# Patient Record
Sex: Male | Born: 1965 | Race: White | Hispanic: No | Marital: Married | Smoking: Current every day smoker
Health system: Southern US, Community
[De-identification: ages and names within clinical notes are randomized; demographics above are authoritative.]

## PROBLEM LIST (undated history)

## (undated) DIAGNOSIS — E119 Type 2 diabetes mellitus without complications: Secondary | ICD-10-CM

## (undated) DIAGNOSIS — D649 Anemia, unspecified: Secondary | ICD-10-CM

## (undated) DIAGNOSIS — I10 Essential (primary) hypertension: Secondary | ICD-10-CM

## (undated) DIAGNOSIS — T7840XA Allergy, unspecified, initial encounter: Secondary | ICD-10-CM

## (undated) DIAGNOSIS — E785 Hyperlipidemia, unspecified: Secondary | ICD-10-CM

## (undated) DIAGNOSIS — K219 Gastro-esophageal reflux disease without esophagitis: Secondary | ICD-10-CM

## (undated) HISTORY — PX: UPPER GASTROINTESTINAL ENDOSCOPY: SHX188

## (undated) HISTORY — DX: Hyperlipidemia, unspecified: E78.5

## (undated) HISTORY — PX: ANKLE SURGERY: SHX546

## (undated) HISTORY — DX: Anemia, unspecified: D64.9

## (undated) HISTORY — DX: Gastro-esophageal reflux disease without esophagitis: K21.9

## (undated) HISTORY — DX: Allergy, unspecified, initial encounter: T78.40XA

---

## 1992-01-17 HISTORY — PX: VASECTOMY: SHX75

## 2002-01-29 ENCOUNTER — Ambulatory Visit (HOSPITAL_COMMUNITY): Admission: RE | Admit: 2002-01-29 | Discharge: 2002-01-29 | Payer: Self-pay | Admitting: Internal Medicine

## 2002-09-05 ENCOUNTER — Emergency Department (HOSPITAL_COMMUNITY): Admission: EM | Admit: 2002-09-05 | Discharge: 2002-09-05 | Payer: Self-pay | Admitting: *Deleted

## 2002-09-05 ENCOUNTER — Encounter: Payer: Self-pay | Admitting: *Deleted

## 2003-04-09 ENCOUNTER — Ambulatory Visit (HOSPITAL_COMMUNITY): Admission: RE | Admit: 2003-04-09 | Discharge: 2003-04-09 | Payer: Self-pay | Admitting: Internal Medicine

## 2006-06-09 ENCOUNTER — Emergency Department (HOSPITAL_COMMUNITY): Admission: EM | Admit: 2006-06-09 | Discharge: 2006-06-09 | Payer: Self-pay | Admitting: Emergency Medicine

## 2006-06-27 ENCOUNTER — Encounter: Admission: RE | Admit: 2006-06-27 | Discharge: 2006-06-27 | Payer: Self-pay | Admitting: Orthopedic Surgery

## 2006-07-25 ENCOUNTER — Ambulatory Visit (HOSPITAL_BASED_OUTPATIENT_CLINIC_OR_DEPARTMENT_OTHER): Admission: RE | Admit: 2006-07-25 | Discharge: 2006-07-25 | Payer: Self-pay | Admitting: Orthopedic Surgery

## 2010-05-31 NOTE — Op Note (Signed)
NAMEOLANDER, FRIEDL NO.:  [id number]   MEDICAL RECORD NO.:  [id number]          PATIENT TYPE:  AMB   LOCATION:  DSC                          FACILITY:  [facility]   PHYSICIAN:  Loreta Ave, M.D. DATE OF BIRTH:  [birth year]   DATE OF PROCEDURE:  07/25/2006  DATE OF DISCHARGE:                               OPERATIVE REPORT   PREOPERATIVE DIAGNOSIS:  Traumatic anterior impingement, loose bodies  right ankle.   POSTOPERATIVE DIAGNOSIS:  Traumatic anterior impingement, loose bodies  right ankle, extensive synovitis and more than five osteochondral loose  bodies found.   PROCEDURE:  Right ankle exam under anesthesia, arthroscopy, removal of  numerous loose bodies, five large and numerous small ones, extensive  synovectomy, and then anterior decompression with removal of tibiotalar  as well as medial malleolar spurs.   SURGEON:  Loreta Ave, M.D.   ASSISTANT:  Genene Churn. Barry Dienes, P.A.-C.   ANESTHESIA:  General.   BLOOD LOSS:  Minimal.   TOURNIQUET TIME:  1 hour.   SPECIMENS:  None.   CULTURES:  None.   COMPLICATIONS:  None.   DRESSING:  Soft compressive with Cam walker.   PROCEDURE IN DETAIL:  The patient was brought to the operating room and  placed on the operating table in supine position.  After adequate  anesthesia had been obtained, stirrup applied.  Prepped and draped in  the usual sterile fashion.  Fluoroscopic views to confirm anterior bony  impingement of the loose bodies.  Exsanguinated with elevation of  Esmarch, tourniquet inflated to 300 mmHg.  Anterolateral and  anteromedial portals avoiding neurovascular structures and tendons.  Arthroscope was introduced, ankle distended and inspected.  Extensive  loose bodies throughout, all removed, large ones with grasping forceps,  small ones with the shaver.  I confirmed anterior bony impingement.  Sequential removal of all of the impinging spurs with shaver and then  bur.  Obliterating all  impingement.  He went from essentially no  dorsiflexion to almost 20 degrees of dorsiflexion at completion.  Both  gutters assessed to remove loose bodies there.  When that was all  completed, we examined the rest of the ankle.  Some mild grade 2 change  on the medial malleolus, but otherwise, the rest of the ankle looked  fairly good.  After  confirmation of good motion, stable ankle, and adequate decompression.  The instruments and fluid removed.  All portals closed with nylon.  Tourniquet deflated and removed.  Sterile compressive dressing applied.  Cam walker applied.  Anesthesia reversed.  Brought to recovery room.  Tolerated the surgery well.  No complications.      Loreta Ave, M.D.  Electronically Signed     DFM/MEDQ  D:  07/26/2006  T:  07/26/2006  Job:  [id number]

## 2010-06-03 NOTE — Op Note (Signed)
NAMEVIRGIE, [name]                           ACCOUNT NO.:  [id number]   MEDICAL RECORD NO.:  [id number]                   PATIENT TYPE:  AMB   LOCATION:  DAY                                  FACILITY:  [facility]   PHYSICIAN:  [name], M.D.                 DATE OF BIRTH:  1965-05-31   DATE OF PROCEDURE:  01/29/2002  DATE OF DISCHARGE:                                 OPERATIVE REPORT   PROCEDURE:  Esophagogastroduodenoscopy.   ENDOSCOPIST:  [name], M.D.   INDICATIONS:  This patient is a 45 year old Caucasian male with recurrent  epigastric and retrosternal pain.  He has been tried on various PPIs and has  had no relief. The pain is relieved with meals but only for a few minutes.  He was treated for an H. pylori infection, about 2 years ago, but he only  was treated with a single antibiotic.  He does continue anywhere from 0-6  BCs for headache.  He has not had any dysphagia or melena.  He is undergoing  diagnostic examination.  Procedure and risks were reviewed with the patient  and informed consent was obtained.   PREOPERATIVE MEDICATIONS:  Cetacaine spray for oropharyngeal topical  anesthesia, Demerol 50 mg IV and Versed 7 mg IV in divided dose.   INSTRUMENT:  Olympus video system.   FINDINGS:  Procedure performed in endoscopy suite.  The patient's vital  signs and O2 saturation were monitored during the procedure and remained  stable.  The patient was placed in the left lateral recumbent position and  endoscope was passed via the oropharynx without any difficulty into the  esophagus.   ESOPHAGUS:  Mucosa of the esophagus was normal throughout.  Squamocolumnar  junction was unremarkable.  No hernia was noted.   STOMACH:  It was empty and distended very well with insufflation.  The folds  of the proximal stomach were normal.  Examination of the mucosa revealed  anterolateral erythema.  There were 3 prepyloric ulcers.  Two were 3-4 mm.  He largest one was fairly  deep along the greater curvature measuring about  10 x 5 mm.  There was a clean base.  Endoscopically these ulcers appeared to  be benign.  Angularis and fundus were examined by retroflexing the scope and  were normal.   DUODENUM:  Examination of the bulb and second part of the duodenum was  normal.   Endoscope was withdrawn.  The patient tolerated the procedure well.   FINAL DIAGNOSES:  1. Multiple (3) pyloric ulcers.  2. Suspect that this could be due to chronic Helicobacter pylori infection     or use of BCs or a combination.   RECOMMENDATIONS:  Will treat him with Prevpac for 2 weeks.  He will resume  his Protonix when he finishes his Prevpac.  The patient advised not to take  [facility] powder or other NSAIDs.  I talked  with Dr. Ouida Golden over the phone and will  treat his headache with Midrin. If this does help he will follow with Dr.  Ouida Golden to review other options.   We plan to bring him back for follow up EGD 10 weeks from now.                                               [name], M.D.    NR/MEDQ  D:  01/29/2002  T:  01/29/2002  Job:  [id number]   cc:   [name]. [name], M.D.  [address]  [place]  [place] [address]  Fax: [phone]

## 2010-11-01 LAB — POCT HEMOGLOBIN-HEMACUE
Hemoglobin: 14.4
Operator id: 128471

## 2010-11-01 LAB — BASIC METABOLIC PANEL
Calcium: 9.2
Chloride: 101
Creatinine, Ser: 0.71
GFR calc Af Amer: >60

## 2011-05-22 ENCOUNTER — Ambulatory Visit
Admission: RE | Admit: 2011-05-22 | Discharge: 2011-05-22 | Disposition: A | Discharge status: Home / Self Care | Payer: 59 | Source: Ambulatory Visit | Attending: Internal Medicine | Admitting: Internal Medicine

## 2011-05-22 ENCOUNTER — Other Ambulatory Visit: Payer: Self-pay | Admitting: Internal Medicine

## 2011-05-22 DIAGNOSIS — R1031 Right lower quadrant pain: Secondary | ICD-10-CM

## 2011-05-22 MED ORDER — IOHEXOL 300 MG/ML  SOLN
30.0000 mL | Freq: Once | INTRAMUSCULAR | Status: AC | PRN
  Administered 2011-05-22: 30 mL via ORAL

## 2011-05-22 MED ORDER — IOHEXOL 300 MG/ML  SOLN
100.0000 mL | Freq: Once | INTRAMUSCULAR | Status: AC | PRN
  Administered 2011-05-22: 100 mL via INTRAVENOUS

## 2012-11-22 ENCOUNTER — Other Ambulatory Visit: Payer: Self-pay | Admitting: Internal Medicine

## 2012-11-22 DIAGNOSIS — N5089 Other specified disorders of the male genital organs: Secondary | ICD-10-CM

## 2012-11-25 ENCOUNTER — Ambulatory Visit
Admission: RE | Admit: 2012-11-25 | Discharge: 2012-11-25 | Disposition: A | Discharge status: Home / Self Care | Payer: BC Managed Care – PPO | Source: Ambulatory Visit | Attending: Internal Medicine | Admitting: Internal Medicine

## 2012-11-25 DIAGNOSIS — N5089 Other specified disorders of the male genital organs: Secondary | ICD-10-CM

## 2013-03-24 ENCOUNTER — Ambulatory Visit (INDEPENDENT_AMBULATORY_CARE_PROVIDER_SITE_OTHER): Payer: BC Managed Care – PPO | Admitting: Surgery

## 2013-04-10 ENCOUNTER — Ambulatory Visit (INDEPENDENT_AMBULATORY_CARE_PROVIDER_SITE_OTHER): Payer: BC Managed Care – PPO | Admitting: Surgery

## 2015-12-07 ENCOUNTER — Encounter (HOSPITAL_COMMUNITY): Payer: Self-pay | Admitting: Emergency Medicine

## 2015-12-07 ENCOUNTER — Emergency Department (HOSPITAL_COMMUNITY)
Admission: EM | Admit: 2015-12-07 | Discharge: 2015-12-07 | Disposition: A | Discharge status: Home / Self Care | Payer: BLUE CROSS/BLUE SHIELD | Attending: Emergency Medicine | Admitting: Emergency Medicine

## 2015-12-07 DIAGNOSIS — E11649 Type 2 diabetes mellitus with hypoglycemia without coma: Secondary | ICD-10-CM | POA: Insufficient documentation

## 2015-12-07 DIAGNOSIS — F17210 Nicotine dependence, cigarettes, uncomplicated: Secondary | ICD-10-CM | POA: Diagnosis not present

## 2015-12-07 DIAGNOSIS — I10 Essential (primary) hypertension: Secondary | ICD-10-CM | POA: Diagnosis not present

## 2015-12-07 DIAGNOSIS — E162 Hypoglycemia, unspecified: Secondary | ICD-10-CM

## 2015-12-07 HISTORY — DX: Essential (primary) hypertension: I10

## 2015-12-07 HISTORY — DX: Type 2 diabetes mellitus without complications: E11.9

## 2015-12-07 LAB — BASIC METABOLIC PANEL
Anion gap: 11 (ref 5–15)
BUN: 15 mg/dL (ref 6–20)
CALCIUM: 9 mg/dL (ref 8.9–10.3)
CHLORIDE: 108 mmol/L (ref 101–111)
CO2: 22 mmol/L (ref 22–32)
CREATININE: 0.85 mg/dL (ref 0.61–1.24)
GFR calc Af Amer: >60 mL/min (ref >60)
GFR calc non Af Amer: >60 mL/min (ref >60)
Glucose, Bld: 44 mg/dL — CL (ref 65–99)
Potassium: 3.6 mmol/L (ref 3.5–5.1)
SODIUM: 141 mmol/L (ref 135–145)

## 2015-12-07 LAB — CBC WITH DIFFERENTIAL/PLATELET
BASOS PCT: 0 %
Basophils Absolute: 0 10*3/uL (ref 0.0–0.1)
EOS ABS: 0 10*3/uL (ref 0.0–0.7)
EOS PCT: 0 %
HCT: 33.4 % — ABNORMAL LOW (ref 39.0–52.0)
HEMOGLOBIN: 10.5 g/dL — AB (ref 13.0–17.0)
LYMPHS ABS: 1 10*3/uL (ref 0.7–4.0)
Lymphocytes Relative: 10 %
MCH: 24.5 pg — AB (ref 26.0–34.0)
MCHC: 31.4 g/dL (ref 30.0–36.0)
MCV: 78 fL (ref 78.0–100.0)
MONO ABS: 0.5 10*3/uL (ref 0.1–1.0)
MONOS PCT: 5 %
Neutro Abs: 8.5 10*3/uL — ABNORMAL HIGH (ref 1.7–7.7)
Neutrophils Relative %: 85 %
PLATELETS: 337 10*3/uL (ref 150–400)
RBC: 4.28 MIL/uL (ref 4.22–5.81)
RDW: 16.2 % — AB (ref 11.5–15.5)
WBC: 10 10*3/uL (ref 4.0–10.5)

## 2015-12-07 LAB — CBG MONITORING, ED
GLUCOSE-CAPILLARY: 179 mg/dL — AB (ref 65–99)
Glucose-Capillary: 47 mg/dL — ABNORMAL LOW (ref 65–99)
Glucose-Capillary: 230 mg/dL — ABNORMAL HIGH (ref 65–99)
Glucose-Capillary: 284 mg/dL — ABNORMAL HIGH (ref 65–99)

## 2015-12-07 MED ORDER — DEXTROSE 50 % IV SOLN
1.0000 | Freq: Once | INTRAVENOUS | Status: AC
  Administered 2015-12-07: 50 mL via INTRAVENOUS

## 2015-12-07 MED ORDER — DEXTROSE 50 % IV SOLN
INTRAVENOUS | Status: AC
  Administered 2015-12-07: 50 mL via INTRAVENOUS
  Filled 2015-12-07: qty 50

## 2015-12-07 NOTE — ED Provider Notes (Signed)
[facility] DEPT Provider Note   CSN: [id number] Arrival date & time: 12/07/15  0355     History   Chief Complaint Chief Complaint  Patient presents with  . Hypoglycemia    HPI [name] is a 50 y.o. male.  HPI  This is a 50 year old male who presents with confusion. Wife states that she woke up and noted that he appeared to be dreaming. She had difficulty waking him up. He was confused and mildly combative. She is unable to check his blood sugar at home. He is a diabetic. Upon my evaluation, patient is now awake, alert, and oriented. However, upon admission his blood sugar was noted to be 47. He was given D50 and has improved. Patient does not remember any events leading to arrival at the hospital. He reports no recent changes in his insulin. However, he does state that when he was taking his insulin last night he got confused on whether he had artery taken his long-acting. He may have doubled up accidentally. He previously has had one to 2 episodes of hypoglycemia with similar symptoms.  Currently the patient is without complaint. No recent illnesses.   Past Medical History:  Diagnosis Date  . Diabetes mellitus without complication ([place])   . Hypertension     There are no active problems to display for this patient.   History reviewed. No pertinent surgical history.     Home Medications    Prior to Admission medications   Not on File    Family History History reviewed. No pertinent family history.  Social History Social History  Substance Use Topics  . Smoking status: Current Every Day Smoker    Packs/day: 1.00    Types: Cigarettes  . Smokeless tobacco: Never Used  . Alcohol use Yes     Comment: occ     Allergies   Patient has no known allergies.   Review of Systems Review of Systems  Constitutional: Negative for fever.  Respiratory: Negative for shortness of breath.   Cardiovascular: Negative for chest pain.  Gastrointestinal: Negative for  abdominal pain, nausea and vomiting.  Neurological: Negative for headaches.  Psychiatric/Behavioral: Positive for confusion.  All other systems reviewed and are negative.    Physical Exam Updated Vital Signs BP 135/74   Pulse 81   Temp 97.4 F (36.3 C) (Oral)   Resp 16   Ht 5\' 10"  (1.778 m)   Wt 195 lb (88.5 kg)   SpO2 98%   BMI 27.98 kg/m   Physical Exam  Constitutional: He is oriented to person, place, and time. He appears well-developed and well-nourished. No distress.  HENT:  Head: Normocephalic and atraumatic.  Cardiovascular: Normal rate, regular rhythm and normal heart sounds.   No murmur heard. Pulmonary/Chest: Effort normal and breath sounds normal. No respiratory distress. He has no wheezes.  Abdominal: Soft. Bowel sounds are normal. There is no tenderness. There is no rebound.  Musculoskeletal: He exhibits no edema.  Neurological: He is alert and oriented to person, place, and time.  Skin: Skin is warm and dry.  Psychiatric: He has a normal mood and affect.  Nursing note and vitals reviewed.    ED Treatments / Results  Labs (all labs ordered are listed, but only abnormal results are displayed) Labs Reviewed  CBC WITH DIFFERENTIAL/PLATELET - Abnormal; Notable for the following:       Result Value   Hemoglobin 10.5 (*)    HCT 33.4 (*)    MCH 24.5 (*)  RDW 16.2 (*)    Neutro Abs 8.5 (*)    All other components within normal limits  BASIC METABOLIC PANEL - Abnormal; Notable for the following:    Glucose, Bld 44 (*)    All other components within normal limits  CBG MONITORING, ED - Abnormal; Notable for the following:    Glucose-Capillary 47 (*)    All other components within normal limits  CBG MONITORING, ED - Abnormal; Notable for the following:    Glucose-Capillary 179 (*)    All other components within normal limits  CBG MONITORING, ED - Abnormal; Notable for the following:    Glucose-Capillary 284 (*)    All other components within normal limits   CBG MONITORING, ED    EKG  EKG Interpretation None       Radiology No results found.  Procedures Procedures (including critical care time)  Medications Ordered in ED Medications  dextrose 50 % solution 50 mL (50 mLs Intravenous Given 12/07/15 0415)     Initial Impression / Assessment and Plan / ED Course  I have reviewed the triage vital signs and the nursing notes.  Pertinent labs & imaging results that were available during my care of the patient were reviewed by me and considered in my medical decision making (see chart for details).  Clinical Course     Patient presents with an episode of hypoglycemia. Upon my evaluation, he is awake, alert, and oriented. He has received D50. It is unclear whether he may have administered double his long-acting insulin dose.  He is otherwise nontoxic. He was given food and was monitored for over 2 hours. Repeat blood sugars 179 and 284. Patient has remained stable on multiple rechecks. Will discharge home. Close monitoring throughout the day. Follow-up with primary physician.  Final Clinical Impressions(s) / ED Diagnoses   Final diagnoses:  Hypoglycemia    New Prescriptions New Prescriptions   No medications on file     Merryl Hacker, MD 12/07/15 [phone]

## 2015-12-07 NOTE — Discharge Instructions (Signed)
Make sure to monitor blood sugars closely.  Follow-up with primary physician. Be mindful of timing and full administration of insulin doses.

## 2015-12-07 NOTE — ED Triage Notes (Signed)
Wife states that she heard husband wake in the middle of the night, having severe confusion

## 2015-12-07 NOTE — ED Notes (Signed)
CRITICAL VALUE ALERT  Critical value received: Glucose 44 Date of notification:12/07/15 Time of notification:  [id number] Critical value read back:Yes.   Nurse who received alert: GMP MD notified (1st page): Dr Dina Rich Time of first page:  [phone] Responding MD:  Dr Dina Rich Time MD responded:  [phone]

## 2015-12-27 ENCOUNTER — Telehealth: Payer: Self-pay | Admitting: Emergency Medicine

## 2015-12-27 NOTE — Telephone Encounter (Signed)
yes

## 2015-12-27 NOTE — Telephone Encounter (Signed)
Called patient to inform him. Left VM to give [place] a call back. Thanks.

## 2015-12-27 NOTE — Telephone Encounter (Signed)
Pts wife called and wants to know if you will be willing to except her husband as a new patient. Please advise thanks.

## 2016-01-18 ENCOUNTER — Other Ambulatory Visit (INDEPENDENT_AMBULATORY_CARE_PROVIDER_SITE_OTHER): Payer: BLUE CROSS/BLUE SHIELD

## 2016-01-18 ENCOUNTER — Ambulatory Visit (INDEPENDENT_AMBULATORY_CARE_PROVIDER_SITE_OTHER): Payer: BLUE CROSS/BLUE SHIELD | Admitting: Internal Medicine

## 2016-01-18 ENCOUNTER — Encounter: Payer: Self-pay | Admitting: Internal Medicine

## 2016-01-18 VITALS — BP 130/80 | HR 76 | Temp 98.5°F | Resp 16 | Ht 70.0 in | Wt 194.5 lb

## 2016-01-18 DIAGNOSIS — E1065 Type 1 diabetes mellitus with hyperglycemia: Secondary | ICD-10-CM | POA: Insufficient documentation

## 2016-01-18 DIAGNOSIS — R7989 Other specified abnormal findings of blood chemistry: Secondary | ICD-10-CM

## 2016-01-18 DIAGNOSIS — Z0000 Encounter for general adult medical examination without abnormal findings: Secondary | ICD-10-CM | POA: Insufficient documentation

## 2016-01-18 DIAGNOSIS — Z23 Encounter for immunization: Secondary | ICD-10-CM

## 2016-01-18 DIAGNOSIS — D500 Iron deficiency anemia secondary to blood loss (chronic): Secondary | ICD-10-CM

## 2016-01-18 DIAGNOSIS — D539 Nutritional anemia, unspecified: Secondary | ICD-10-CM

## 2016-01-18 DIAGNOSIS — E785 Hyperlipidemia, unspecified: Secondary | ICD-10-CM | POA: Diagnosis not present

## 2016-01-18 DIAGNOSIS — E1069 Type 1 diabetes mellitus with other specified complication: Secondary | ICD-10-CM | POA: Insufficient documentation

## 2016-01-18 DIAGNOSIS — K219 Gastro-esophageal reflux disease without esophagitis: Secondary | ICD-10-CM | POA: Insufficient documentation

## 2016-01-18 DIAGNOSIS — E109 Type 1 diabetes mellitus without complications: Secondary | ICD-10-CM | POA: Insufficient documentation

## 2016-01-18 DIAGNOSIS — D649 Anemia, unspecified: Secondary | ICD-10-CM | POA: Insufficient documentation

## 2016-01-18 DIAGNOSIS — I2510 Atherosclerotic heart disease of native coronary artery without angina pectoris: Secondary | ICD-10-CM | POA: Diagnosis not present

## 2016-01-18 LAB — CBC WITH DIFFERENTIAL/PLATELET
BASOS ABS: 0.1 10*3/uL (ref 0.0–0.1)
Basophils Relative: 0.5 % (ref 0.0–3.0)
EOS ABS: 0.2 10*3/uL (ref 0.0–0.7)
Eosinophils Relative: 1.7 % (ref 0.0–5.0)
HCT: 34.8 % — ABNORMAL LOW (ref 39.0–52.0)
Hemoglobin: 11.3 g/dL — ABNORMAL LOW (ref 13.0–17.0)
LYMPHS ABS: 1.1 10*3/uL (ref 0.7–4.0)
MCHC: 32.5 g/dL (ref 30.0–36.0)
MCV: 74.2 fl — ABNORMAL LOW (ref 78.0–100.0)
Monocytes Absolute: 0.6 10*3/uL (ref 0.1–1.0)
Monocytes Relative: 4.4 % (ref 3.0–12.0)
NEUTROS ABS: 12.2 10*3/uL — AB (ref 1.4–7.7)
PLATELETS: 380 10*3/uL (ref 150.0–400.0)
RBC: 4.69 Mil/uL (ref 4.22–5.81)
RDW: 16.7 % — ABNORMAL HIGH (ref 11.5–15.5)
WBC: 14.2 10*3/uL — ABNORMAL HIGH (ref 4.0–10.5)

## 2016-01-18 LAB — COMPREHENSIVE METABOLIC PANEL
ALK PHOS: 141 U/L — AB (ref 39–117)
ALT: 14 U/L (ref 0–53)
AST: 18 U/L (ref 0–37)
Albumin: 4.3 g/dL (ref 3.5–5.2)
BILIRUBIN TOTAL: 0.2 mg/dL (ref 0.2–1.2)
BUN: 19 mg/dL (ref 6–23)
CALCIUM: 8.7 mg/dL (ref 8.4–10.5)
CO2: 25 meq/L (ref 19–32)
CREATININE: 1.09 mg/dL (ref 0.40–1.50)
Chloride: 99 mEq/L (ref 96–112)
GFR: 76.18 mL/min (ref >60.00)
Glucose, Bld: 384 mg/dL — ABNORMAL HIGH (ref 70–99)
Potassium: 4.4 mEq/L (ref 3.5–5.1)
Sodium: 135 mEq/L (ref 135–145)
TOTAL PROTEIN: 7.3 g/dL (ref 6.0–8.3)

## 2016-01-18 LAB — HEPATITIS B SURFACE ANTIBODY,QUALITATIVE: Hep B S Ab: NEGATIVE

## 2016-01-18 LAB — TSH: TSH: 2.15 u[IU]/mL (ref 0.35–4.50)

## 2016-01-18 LAB — URINALYSIS, ROUTINE W REFLEX MICROSCOPIC
BILIRUBIN URINE: NEGATIVE
KETONES UR: NEGATIVE
LEUKOCYTES UA: NEGATIVE
NITRITE: NEGATIVE
PH: 5.5 (ref 5.0–8.0)
RBC / HPF: NONE SEEN (ref 0–?)
Specific Gravity, Urine: 1.01 (ref 1.000–1.030)
UROBILINOGEN UA: 0.2 (ref 0.0–1.0)

## 2016-01-18 LAB — HEMOGLOBIN A1C: Hgb A1c MFr Bld: 10.9 % — ABNORMAL HIGH (ref 4.6–6.5)

## 2016-01-18 LAB — HIV ANTIBODY (ROUTINE TESTING W REFLEX): HIV 1&2 Ab, 4th Generation: NONREACTIVE

## 2016-01-18 LAB — VITAMIN B12: Vitamin B-12: 454 pg/mL (ref 211–911)

## 2016-01-18 LAB — LIPID PANEL
CHOL/HDL RATIO: 4
CHOLESTEROL: 254 mg/dL — AB (ref 0–200)
HDL: 65.6 mg/dL (ref >39.00)
NonHDL: 188.04
Triglycerides: 231 mg/dL — ABNORMAL HIGH (ref 0.0–149.0)
VLDL: 46.2 mg/dL — ABNORMAL HIGH (ref 0.0–40.0)

## 2016-01-18 LAB — LDL CHOLESTEROL, DIRECT: LDL DIRECT: 156 mg/dL

## 2016-01-18 LAB — HEPATITIS B CORE ANTIBODY, TOTAL: Hep B Core Total Ab: NONREACTIVE

## 2016-01-18 LAB — PSA: PSA: 0.79 ng/mL (ref 0.10–4.00)

## 2016-01-18 LAB — HEPATITIS C ANTIBODY: HCV Ab: NEGATIVE

## 2016-01-18 LAB — MICROALBUMIN / CREATININE URINE RATIO
Creatinine,U: 61.4 mg/dL
Microalb Creat Ratio: 27.7 mg/g (ref 0.0–30.0)
Microalb, Ur: 17 mg/dL — ABNORMAL HIGH (ref 0.0–1.9)

## 2016-01-18 LAB — FOLATE: FOLATE: 15.7 ng/mL (ref >5.9)

## 2016-01-18 LAB — FERRITIN: FERRITIN: 7 ng/mL — AB (ref 22.0–322.0)

## 2016-01-18 LAB — HEPATITIS A ANTIBODY, TOTAL: HEP A TOTAL AB: REACTIVE — AB

## 2016-01-18 MED ORDER — ASPIRIN EC 81 MG PO TBEC: 81.0000 mg | DELAYED_RELEASE_TABLET | Freq: Every day | ORAL | 3 refills | Status: DC

## 2016-01-18 MED ORDER — ESOMEPRAZOLE MAGNESIUM 20 MG PO CPDR: 20.0000 mg | DELAYED_RELEASE_CAPSULE | Freq: Every day | ORAL | 3 refills | Status: DC

## 2016-01-18 MED ORDER — IRBESARTAN 300 MG PO TABS: 300.0000 mg | ORAL_TABLET | Freq: Every day | ORAL | 2 refills | Status: DC

## 2016-01-18 NOTE — Progress Notes (Signed)
Pre visit review using our clinic review tool, if applicable. No additional management support is needed unless otherwise documented below in the visit note. 

## 2016-01-18 NOTE — Progress Notes (Signed)
Subjective:  Patient ID: [name], male    DOB: Jul 02, 1965  Age: 51 y.o. MRN: [id number]  CC: Diabetes; Hyperlipidemia; Annual Exam; and Anemia   HPI [name] presents for a CPX.  He is new to me and needs a follow-up on diabetes mellitus (dx'ed 7 years ago). He is controlled with multiple insulin injections so he describes himself as a type I. He is in between endocrinologists and needs me to refer him to a new endocrinologist. He has not recently had any episodes of polys.  He tells me his blood pressure has been well-controlled with the ARB and he denies any recent episodes of headache, blurred vision, chest pain, shortness of breath, palpitations, edema, fatigue.  He had recent lab work done that showed he was anemic. He denies paresthesias, concerns for blood loss, shortness of breath, weakness, or fatigue. He has a history of GERD and peptic ulcer disease. He tells me his heartburn is well controlled with Nexium and he denies odynophagia, dysphagia, or abdominal pain.  History [name] has a past medical history of Diabetes mellitus without complication ([place]) and Hypertension.   He has no past surgical history on file.   His family history includes COPD in his mother; Cancer (age of onset: 19) in his sister; Early death (age of onset: 43) in his father; Heart disease in his father; Hypertension in his brother.He reports that he has been smoking Cigarettes.  He has been smoking about 1.00 pack per day. He has never used smokeless tobacco. He reports that he drinks about 4.8 oz of alcohol per week . He reports that he does not use drugs.  No outpatient prescriptions prior to visit.   No facility-administered medications prior to visit.     ROS Review of Systems  Constitutional: Negative.  Negative for activity change, appetite change, diaphoresis, fatigue and unexpected weight change.  HENT: Negative.  Negative for trouble swallowing and voice change.   Eyes: Negative for  visual disturbance.  Respiratory: Negative.  Negative for cough, chest tightness, shortness of breath, wheezing and stridor.   Cardiovascular: Negative.  Negative for chest pain, palpitations and leg swelling.  Gastrointestinal: Negative for abdominal pain, blood in stool, constipation, diarrhea, nausea and vomiting.  Endocrine: Negative.  Negative for cold intolerance, heat intolerance, polydipsia, polyphagia and polyuria.  Genitourinary: Negative.  Negative for difficulty urinating, dysuria, scrotal swelling, testicular pain and urgency.  Musculoskeletal: Negative.   Skin: Negative.   Allergic/Immunologic: Negative.   Neurological: Negative.  Negative for dizziness, weakness, light-headedness, numbness and headaches.  Hematological: Negative for adenopathy. Does not bruise/bleed easily.  Psychiatric/Behavioral: Negative.     Objective:  BP 130/80 (BP Location: Left Arm, Patient Position: Sitting, Cuff Size: Normal)   Pulse 76   Temp 98.5 F (36.9 C) (Oral)   Resp 16   Ht 5\' 10"  (1.778 m)   Wt 194 lb 8 oz (88.2 kg)   SpO2 96%   BMI 27.91 kg/m   Physical Exam  Constitutional: He is oriented to person, place, and time. He appears well-developed and well-nourished. No distress.  HENT:  Mouth/Throat: Oropharynx is clear and moist. No oropharyngeal exudate.  Eyes: Conjunctivae are normal. Right eye exhibits no discharge. Left eye exhibits no discharge. No scleral icterus.  Neck: Normal range of motion. Neck supple. No JVD present. No tracheal deviation present. No thyromegaly present.  Cardiovascular: Normal rate, regular rhythm, normal heart sounds and intact distal pulses.  Exam reveals no gallop and no friction rub.  No murmur heard. Pulses:      Carotid pulses are 1+ on the right side, and 1+ on the left side.      Radial pulses are 1+ on the right side, and 1+ on the left side.       Femoral pulses are 1+ on the right side, and 1+ on the left side.      Popliteal pulses are 1+  on the right side, and 1+ on the left side.       Dorsalis pedis pulses are 1+ on the right side, and 1+ on the left side.       Posterior tibial pulses are 1+ on the right side, and 1+ on the left side.  EKG ---  Sinus  Rhythm  -Left axis.   ABNORMAL   Pulmonary/Chest: Effort normal and breath sounds normal. No stridor. No respiratory distress. He has no wheezes. He has no rales. He exhibits no tenderness.  Abdominal: Soft. Bowel sounds are normal. He exhibits no distension and no mass. There is no tenderness. There is no rebound and no guarding. Hernia confirmed negative in the right inguinal area and confirmed negative in the left inguinal area.  Genitourinary: Rectum normal, prostate normal, testes normal and penis normal. Rectal exam shows no external hemorrhoid, no internal hemorrhoid, no fissure, no mass, no tenderness, anal tone normal and guaiac negative stool. Prostate is not enlarged and not tender. Right testis shows no mass, no swelling and no tenderness. Right testis is descended. Left testis shows no mass, no swelling and no tenderness. Left testis is descended. Circumcised. No penile erythema or penile tenderness. No discharge found.  Musculoskeletal: Normal range of motion. He exhibits no edema, tenderness or deformity.  Lymphadenopathy:    He has no cervical adenopathy.       Right: No inguinal adenopathy present.       Left: No inguinal adenopathy present.  Neurological: He is oriented to person, place, and time.  Skin: Skin is warm and dry. No rash noted. He is not diaphoretic. No erythema. No pallor.  Psychiatric: He has a normal mood and affect. His behavior is normal. Judgment and thought content normal.  Vitals reviewed.   Lab Results  Component Value Date   WBC 14.2 (H) 01/18/2016   HGB 11.3 (L) 01/18/2016   HCT 34.8 (L) 01/18/2016   PLT 380.0 01/18/2016   GLUCOSE 384 (H) 01/18/2016   CHOL 254 (H) 01/18/2016   TRIG 231.0 (H) 01/18/2016   HDL 65.60 01/18/2016     LDLDIRECT 156.0 01/18/2016   ALT 14 01/18/2016   AST 18 01/18/2016   NA 135 01/18/2016   K 4.4 01/18/2016   CL 99 01/18/2016   CREATININE 1.09 01/18/2016   BUN 19 01/18/2016   CO2 25 01/18/2016   TSH 2.15 01/18/2016   PSA 0.79 01/18/2016   HGBA1C 10.9 (H) 01/18/2016   MICROALBUR 17.0 (H) 01/18/2016    Assessment & Plan:   Rilee was seen today for diabetes, hyperlipidemia, annual exam and anemia.  Diagnoses and all orders for this visit:  Hyperlipidemia LDL goal <70- he has not achieved his LDL goal so I've asked him to start a statin for cardiovascular risk reduction. -     rosuvastatin (CRESTOR) 20 MG tablet; Take 1 tablet (20 mg total) by mouth daily.  Type 1 diabetes mellitus with hyperglycemia ([place])- his A1c is elevated at 10.9%, for now he will continue the current regimen for blood sugar control but I have asked him  to get established with a new endocrinologist. -     Hemoglobin A1c; Future -     Microalbumin / creatinine urine ratio; Future -     irbesartan (AVAPRO) 300 MG tablet; Take 1 tablet (300 mg total) by mouth daily. -     aspirin EC 81 MG tablet; Take 1 tablet (81 mg total) by mouth daily. -     rosuvastatin (CRESTOR) 20 MG tablet; Take 1 tablet (20 mg total) by mouth daily.  Routine general medical examination at a health care facility- exam completed, labs ordered and reviewed, he has positive antibodies for hepatitis A, he has no antibodies to hepatitis B so I've asked him to return to start the hepatitis B vaccines, he is negative for hepatitis C infection, he is almost 70 [address] to go and see GI for screening colonoscopy, vaccines reviewed and updated, patient education material was given. -     Lipid panel; Future -     Comprehensive metabolic panel; Future -     CBC with Differential/Platelet; Future -     PSA; Future -     TSH; Future -     Urinalysis, Routine w reflex microscopic; Future -     Hepatitis A antibody, total; Future -     Hepatitis B  core antibody, total; Future -     Hepatitis B surface antibody; Future -     Hepatitis C antibody; Future -     HIV antibody; Future  Deficiency anemia- his anemia is stable but he is still anemic and has a low iron level. I've asked him to start iron replacement therapy. -     Vitamin B12; Future -     Ferritin; Future -     Folate; Future -     IBC panel; Future -     ferrous sulfate 220 (44 Fe) MG/5ML solution; Take 5 mLs (220 mg total) by mouth 3 (three) times daily with meals.  Atherosclerosis of coronary artery without angina pectoris, unspecified vessel or lesion type, unspecified whether native or transplanted heart -     EKG 12-Lead -     aspirin EC 81 MG tablet; Take 1 tablet (81 mg total) by mouth daily.  Gastroesophageal reflux disease without esophagitis -     esomeprazole (NEXIUM) 20 MG capsule; Take 1 capsule (20 mg total) by mouth daily at 12 noon.  Need for prophylactic vaccination and inoculation against influenza -     Flu Vaccine QUAD 36+ mos IM  Iron deficiency anemia due to chronic blood loss- he has iron deficiency anemia and a history of peptic ulcer disease, I have asked him to see GI to see if he needs endoscopies to screen for GI sources of blood loss. -     Ambulatory referral to Gastroenterology -     ferrous sulfate 220 (44 Fe) MG/5ML solution; Take 5 mLs (220 mg total) by mouth 3 (three) times daily with meals.   I have changed Mr. Santillo's irbesartan. I am also having him start on esomeprazole, aspirin EC, rosuvastatin, and ferrous sulfate. Additionally, I am having him maintain his metFORMIN, insulin NPH Human, and insulin regular.  Meds ordered this encounter  Medications  . metFORMIN (GLUCOPHAGE-XR) 500 MG 24 hr tablet    Refill:  2  . DISCONTD: irbesartan (AVAPRO) 300 MG tablet    Refill:  2  . insulin NPH Human (HUMULIN N,NOVOLIN N) 100 UNIT/ML injection    Sig: Inject into the skin. 10 units in  the a.m. And 12 units qhs.  . insulin regular  (NOVOLIN R,HUMULIN R) 250 units/2.17mL (100 units/mL) injection    Sig: Inject into the skin 3 (three) times daily before meals. Injections based on sliding scale  . esomeprazole (NEXIUM) 20 MG capsule    Sig: Take 1 capsule (20 mg total) by mouth daily at 12 noon.    Dispense:  90 capsule    Refill:  3  . irbesartan (AVAPRO) 300 MG tablet    Sig: Take 1 tablet (300 mg total) by mouth daily.    Dispense:  90 tablet    Refill:  2  . aspirin EC 81 MG tablet    Sig: Take 1 tablet (81 mg total) by mouth daily.    Dispense:  90 tablet    Refill:  3  . rosuvastatin (CRESTOR) 20 MG tablet    Sig: Take 1 tablet (20 mg total) by mouth daily.    Dispense:  90 tablet    Refill:  3  . ferrous sulfate 220 (44 Fe) MG/5ML solution    Sig: Take 5 mLs (220 mg total) by mouth 3 (three) times daily with meals.    Dispense:  473 mL    Refill:  11     Follow-up: Return in about 6 months (around 07/17/2016).  Scarlette Calico, MD

## 2016-01-18 NOTE — Patient Instructions (Signed)

## 2016-01-19 ENCOUNTER — Other Ambulatory Visit: Payer: BLUE CROSS/BLUE SHIELD

## 2016-01-20 ENCOUNTER — Telehealth: Payer: Self-pay

## 2016-01-20 ENCOUNTER — Encounter: Payer: Self-pay | Admitting: Internal Medicine

## 2016-01-20 DIAGNOSIS — D500 Iron deficiency anemia secondary to blood loss (chronic): Secondary | ICD-10-CM | POA: Insufficient documentation

## 2016-01-20 LAB — IBC PANEL
IRON: 35 ug/dL — AB (ref 42–165)
SATURATION RATIOS: 6.3 % — AB (ref 20.0–50.0)
TRANSFERRIN: 395 mg/dL — AB (ref 212.0–360.0)

## 2016-01-20 MED ORDER — ROSUVASTATIN CALCIUM 20 MG PO TABS: 20.0000 mg | ORAL_TABLET | Freq: Every day | ORAL | 3 refills | Status: DC

## 2016-01-20 MED ORDER — FERROUS SULFATE 220 (44 FE) MG/5ML PO ELIX: 220.0000 mg | ORAL_SOLUTION | Freq: Three times a day (TID) | ORAL | 11 refills | Status: DC

## 2016-01-20 NOTE — Telephone Encounter (Signed)
Labs were drawn on Tuesday and several of them have not been resulted yet.  Called down to the lab and spoke to [place].   Santiago Glad stated that the [facility] panel is holding up the release of the other labs. She also stated that it was to be over nighted but it did not get sent.   Asked for the verbal results of the un resulted labs.  Results were:   Folate 15.7 Ferritin 7.0 B12 454 Micro Al 17.0 Creat 61.4 Ratio  27.7 A1c 10.9% TSH 2.15 PSA 0.79  UA glucose was >1000 UA RBC was positive UA Protein was positive   CBC WBC 14.2 HBG 11.3 HCT 34.8 Neut % 86  CMP Glucose was 300 ALP was 141  Lipid Total cholesterol 254 Triglycerides  231 LDL Direct  156  Santiago Glad stated that we should have the results today.

## 2016-01-21 ENCOUNTER — Telehealth: Payer: Self-pay | Admitting: Internal Medicine

## 2016-01-21 ENCOUNTER — Encounter: Payer: Self-pay | Admitting: Internal Medicine

## 2016-01-21 NOTE — Telephone Encounter (Signed)
Pt called requesting call with lab results

## 2016-01-21 NOTE — Telephone Encounter (Signed)
Pt informed of results.   Appt scheduled for 1st Hep B injection

## 2016-01-24 ENCOUNTER — Ambulatory Visit: Payer: BLUE CROSS/BLUE SHIELD

## 2016-01-25 ENCOUNTER — Ambulatory Visit (INDEPENDENT_AMBULATORY_CARE_PROVIDER_SITE_OTHER): Payer: BLUE CROSS/BLUE SHIELD | Admitting: Internal Medicine

## 2016-01-25 ENCOUNTER — Encounter: Payer: Self-pay | Admitting: Internal Medicine

## 2016-01-25 VITALS — BP 140/80 | HR 80 | Ht 67.0 in | Wt 199.0 lb

## 2016-01-25 DIAGNOSIS — D509 Iron deficiency anemia, unspecified: Secondary | ICD-10-CM | POA: Diagnosis not present

## 2016-01-25 DIAGNOSIS — K219 Gastro-esophageal reflux disease without esophagitis: Secondary | ICD-10-CM | POA: Diagnosis not present

## 2016-01-25 MED ORDER — NA SULFATE-K SULFATE-MG SULF 17.5-3.13-1.6 GM/177ML PO SOLN: 1.0000 | Freq: Once | ORAL | 0 refills | Status: AC

## 2016-01-25 NOTE — Progress Notes (Signed)
HISTORY OF PRESENT ILLNESS:  [name] is a 51 y.o. male with insulin requiring diabetes mellitus, hyperlipidemia, hypertension and chronic GERD who is referred by his primary care provider Dr. Scarlette Calico with a chief complaint of iron deficiency anemia. The patient underwent routine blood work November 2017. He was found to be anemic with a hemoglobin of 10.5. Previous hemoglobin 2008 was 14.4. Recent MCV low at 78. Subsequent iron studies revealed iron deficiency with saturation of 6.3% and ferritin level of 7. Normal folate and B12. Patient reports a very remote upper endoscopy years ago elsewhere. No details. Otherwise no GI evaluations. He does have chronic GERD which requires Nexium daily for control of symptoms. He denies dysphagia. His weight has been stable. He does use BC powders daily. No additional NSAIDs. He denies melena or hematochezia. No change in bowel habits or abdominal pain. No family history of colon cancer. Remote CT scan of the abdomen and pelvis with contrast May 2013 to evaluate right lower quadrant pain. This was said to show circumferential wall thickening of the cecum. He has donated blood in the past but not for 10 years.  REVIEW OF SYSTEMS:  All non-GI ROS negative upon comprehensive review of all systems  Past Medical History:  Diagnosis Date  . Anemia   . Diabetes mellitus without complication ([place])   . GERD (gastroesophageal reflux disease)   . HLD (hyperlipidemia)   . Hypertension     Past Surgical History:  Procedure Laterality Date  . ANKLE SURGERY Right   . [place]  reports that he has been smoking Cigarettes.  He has been smoking about 1.00 pack per day. He has never used smokeless tobacco. He reports that he drinks about 4.8 oz of alcohol per week . He reports that he does not use drugs.  family history includes COPD in his mother; Diabetes in his maternal grandmother and mother; Early death (age of onset:  41) in his father; Heart attack in his father; Hypertension in his brother; Lymphoma (age of onset: 7) in his sister.  No Known Allergies     PHYSICAL EXAMINATION: Vital signs: BP 140/80 (BP Location: Left Arm, Patient Position: Sitting, Cuff Size: Normal)   Pulse 80   Ht 5\' 7"  (1.702 m) Comment: height measured without shoes  Wt 199 lb (90.3 kg)   BMI 31.17 kg/m   Constitutional: generally well-appearing, no acute distress Psychiatric: alert and oriented x3, cooperative Eyes: extraocular movements intact, anicteric, conjunctiva pink Mouth: oral pharynx moist, no lesions Neck: supple no lymphadenopathy Cardiovascular: heart regular rate and rhythm, no murmur Lungs: clear to auscultation bilaterally Abdomen: soft, nontender, nondistended, no obvious ascites, no peritoneal signs, normal bowel sounds, no organomegaly Rectal:Deferred until colonoscopy Extremities: no clubbing cyanosis or lower extremity edema bilaterally Skin: no lesions on visible extremities Neuro: No focal deficits. Cranial nerves intact. No asterixis.   ASSESSMENT:  #1. Iron deficiency anemia. Rule out GI mucosal lesion. Rule out iron absorptive disorder (celiac disease in a diabetic) #2. Chronic GERD. Requires PPI for control #3. CT scan 2013 questioning cecal abnormality #4. Insulin requiring diabetes mellitus  PLAN:  #1. Schedule colonoscopy and upper endoscopy with biopsies to evaluate iron deficiency anemia, clarify remote CT, and screen for Barrett's. The patient is higher than baseline risk due to his insulin requiring diabetes. He will require a morning appointment.The nature of the procedure, as well as the risks, benefits, and alternatives were carefully and thoroughly reviewed  with the patient. Ample time for discussion and questions allowed. The patient understood, was satisfied, and agreed to proceed. #2. Hold morning diabetic medications to avoid and wanted hypoglycemia #3. Reflux precautions #4.  Continue PPI #5. Discussion on potential side effects of chronic BC powder use  A copy of this consultation note has been sent to Dr. Ronnald Ramp

## 2016-01-25 NOTE — Patient Instructions (Signed)

## 2016-01-26 ENCOUNTER — Other Ambulatory Visit: Payer: Self-pay | Admitting: Internal Medicine

## 2016-01-31 ENCOUNTER — Encounter: Payer: Self-pay | Admitting: Internal Medicine

## 2016-02-14 ENCOUNTER — Encounter: Payer: Self-pay | Admitting: Internal Medicine

## 2016-02-14 NOTE — Addendum Note (Signed)
Addended by: Janith Lima on: 02/14/2016 01:04 PM   Modules accepted: Orders

## 2016-02-25 ENCOUNTER — Encounter: Payer: Self-pay | Admitting: Internal Medicine

## 2016-02-25 ENCOUNTER — Ambulatory Visit (INDEPENDENT_AMBULATORY_CARE_PROVIDER_SITE_OTHER): Payer: BLUE CROSS/BLUE SHIELD | Admitting: Internal Medicine

## 2016-02-25 VITALS — BP 128/82 | HR 94 | Ht 68.0 in | Wt 192.0 lb

## 2016-02-25 DIAGNOSIS — E1065 Type 1 diabetes mellitus with hyperglycemia: Secondary | ICD-10-CM

## 2016-02-25 MED ORDER — INSULIN PEN NEEDLE 32G X 4 MM MISC: 5 refills | Status: DC

## 2016-02-25 MED ORDER — INSULIN ASPART 100 UNIT/ML FLEXPEN: 7.0000 [IU] | PEN_INJECTOR | Freq: Three times a day (TID) | SUBCUTANEOUS | 5 refills | Status: DC

## 2016-02-25 MED ORDER — GLUCAGON (RDNA) 1 MG IJ KIT
1.0000 mg | PACK | Freq: Once | INTRAMUSCULAR | 12 refills | Status: DC | PRN
Start: 2016-02-25 — End: 2017-01-04

## 2016-02-25 MED ORDER — INSULIN DEGLUDEC 200 UNIT/ML ~~LOC~~ SOPN: 20.0000 [IU] | PEN_INJECTOR | Freq: Every day | SUBCUTANEOUS | 5 refills | Status: DC

## 2016-02-25 NOTE — Progress Notes (Signed)
Patient ID: [name], male   DOB: 01-17-1965, 51 y.o.   MRN: [id number]   HPI: [name] is a 51 y.o.-year-old male, referred by his PCP, Dr.Jones, for management of DM2, dx in 2008, insulin-dependent since dx, uncontrolled, without long term complications. He saw Dr. Buddy Duty before.  Last hemoglobin A1c was: Lab Results  Component Value Date   HGBA1C 10.9 (H) 01/18/2016  Prev: 12%.  Pt is on a regimen of: - Metformin ER 1000 mg with dinner - NPH 10 units in am and 12 units at night  - R insulin 7 units 3x a day, before meals, + SSI: target: 150, adds 5 units if 150-200 - takes it usually 1h before meals He was on Humalog pens >> stopped being covered by the insurance In the past.  Pt checks his sugars 3-4 a day and they are: - am: 50-200 (takes ~ 10 units) - 2h after b'fast: n/c - before lunch: 160-170 (takes ~10 units) - 2h after lunch: n/c - before dinner: 150-160s, occas. Up to 200 - 2h after dinner: n/c - bedtime: 170-200, 260  - nighttime: n/c + lows - had to betaken to the hospital at night (30s) - 11/2015; passed out in [place] (30s) - 01/2015; both times he had a URI. he has hypoglycemia awareness at 19s.  Highest sugar was 310 - seldom.  Glucometer: ReliOn  Pt's meals are: - Breakfast: oatmeal, bisquit, toast, cereal - Lunch: grilled chicken, burgers, salad, tuna - Dinner: same - Snacks: apple, banana, protein bar x2  - no CKD, last BUN/creatinine:  Lab Results  Component Value Date   BUN 19 01/18/2016   BUN 15 12/07/2015   CREATININE 1.09 01/18/2016   CREATININE 0.85 12/07/2015   - last set of lipids: Lab Results  Component Value Date   CHOL 254 (H) 01/18/2016   HDL 65.60 01/18/2016   LDLDIRECT 156.0 01/18/2016   TRIG 231.0 (H) 01/18/2016   CHOLHDL 4 01/18/2016   - last eye exam was in 02/2015. No DR. Dr. [facility]). - no numbness and tingling in his feet.  Pt has FH of DM in mother, MGM.  He also has a history of GERD, anemia,  hyperlipidemia.  ROS: Constitutional: no weight Golden/loss, no fatigue, no subjective hyperthermia/hypothermia Eyes: no blurry vision, no xerophthalmia ENT: + sore throat, no nodules palpated in throat, no dysphagia/odynophagia, + hoarseness, + tinnitus Cardiovascular: no CP/SOB/palpitations/leg swelling Respiratory: + cough/no SOB/+ wheezing Gastrointestinal: no N/V/D/C/+ heartburn Musculoskeletal: + Both: muscle/joint aches Skin: no rashes, + easy bruising Neurological: no tremors/numbness/tingling/dizziness Psychiatric: no depression/anxiety + Difficulty with erections  Past Medical History:  Diagnosis Date  . Anemia   . Diabetes mellitus without complication ([place])   . GERD (gastroesophageal reflux disease)   . HLD (hyperlipidemia)   . Hypertension    Past Surgical History:  Procedure Laterality Date  . ANKLE SURGERY Right   . VASECTOMY  1994   Social History   Social History  . Marital status: Married    Spouse name: [name]  . Number of children: 1   Occupational History  . Architect    Social History Main Topics  . Smoking status: Current Every Day Smoker    Packs/day: 1.5    Types: Cigarettes  . Smokeless tobacco: Never Used  . Alcohol use 4.8 oz/week    8 Cans of beer per week     Comment: occ  . Drug use: No   Current Outpatient Prescriptions  Medication Sig Dispense Refill  .  aspirin EC 81 MG tablet Take 1 tablet (81 mg total) by mouth daily. 90 tablet 3  . esomeprazole (NEXIUM) 20 MG capsule Take 1 capsule (20 mg total) by mouth daily at 12 noon. 90 capsule 3  . ferrous sulfate 220 (44 Fe) MG/5ML solution Take 5 mLs (220 mg total) by mouth 3 (three) times daily with meals. 473 mL 11  . irbesartan (AVAPRO) 300 MG tablet Take 1 tablet (300 mg total) by mouth daily. 90 tablet 2  . metFORMIN (GLUCOPHAGE-XR) 500 MG 24 hr tablet   2  Also, NPH 10 units in am and 12 units at night  - R insulin 7 units 3x a day, before meals, + SSI  Allergies  Allergen  Reactions  . Crestor [Rosuvastatin Calcium] Other (See Comments)    Muscle aches   Family History  Problem Relation Age of Onset  . COPD Mother   . Diabetes Mother   . Early death Father 13    MI  . Heart attack Father   . Lymphoma Sister 66  . Hypertension Brother   . Diabetes Maternal Grandmother   . Stroke Neg Hx   . Alcohol abuse Neg Hx   . Drug abuse Neg Hx   . Hyperlipidemia Neg Hx   . Kidney disease Neg Hx    PE: BP 128/82 (BP Location: Left Arm, Patient Position: Sitting)   Pulse 94   Ht 5\' 8"  (1.727 m)   Wt 192 lb (87.1 kg)   SpO2 96%   BMI 29.19 kg/m  Wt Readings from Last 3 Encounters:  02/25/16 192 lb (87.1 kg)  01/25/16 199 lb (90.3 kg)  01/18/16 194 lb 8 oz (88.2 kg)   Constitutional: overweight, in NAD Eyes: PERRLA, EOMI, no exophthalmos ENT: moist mucous membranes, no thyromegaly, no cervical lymphadenopathy Cardiovascular: RRR, No MRG Respiratory: CTA B Gastrointestinal: abdomen soft, NT, ND, BS+ Musculoskeletal: no deformities, strength intact in all 4 Skin: moist, warm, no rashes Neurological: Very slight tremor with outstretched hands, DTR normal in all 4  ASSESSMENT: 1. DM2, insulin-dependent, uncontrolled, without long term complications, but with hyperglycemia and hypoglycemia  PLAN:  1. Patient with long-standing, uncontrolled diabetes, on oral antidiabetic regimen + basal-bolus insulin regimen with N and R insulins. He is having high blood sugars during the day and may have low blood sugars at night and in the morning. He had 2 episodes of hypoglycemia for which she had to have help, 1 at the beginning of last year and 1 this past fall. We reviewed at length the 15-15 rule for correcting hypoglycemia and I also demonstrated glucagon pen use and sent a prescription for this to the pharmacy. - He has been on analog insulins in the past that he has done much better, with HbA1c levels between 6 and 7%. He now has better insurance and he believes  that these are covered again. Therefore, I suggest this to stop the N and the R insulin and switch to [place] (which is longer acting and less likely to cause hypoglycemia especially during the night) and also NovoLog per insurance preference. I will give him 2 different doses of NovoLog to use with smaller and larger meals. I also changed his sliding scale to be a little more flexible. - We may increase the metformin at next visit, but for now I advised him to stay on the same dose. - I suggested to:  Patient Instructions  Please continue Metformin ER 1000 mg at dinnertime.  Please stop N  and R insulins and start: - Tresiba U200 20 units at bedtime - NovoLog mealtime insulin: 7 units before a smaller meal 9 units before a larger meal  Change the sliding scale as follows: - 150-175: + 1 unit  - 176-200: + 2 units  - 201-225: + 3 units  - >226-250: + 4 units   Please return in 1.5-2 months with your sugar log.   - Strongly advised him to start checking sugars at different times of the day - check 2 times a day, rotating checks - given sugar log and advised how to fill it and to bring it at next appt  - given foot care handout and explained the principles  - given instructions for hypoglycemia management "15-15 rule"  - advised for yearly eye exams  - Return to clinic in 1.5-2 mo with sugar log  Philemon Kingdom, MD PhD [facility] Endocrinology

## 2016-02-25 NOTE — Patient Instructions (Addendum)
Please continue Metformin ER 1000 mg at dinnertime.  Please stop N and R insulins and start: - Tresiba U200 20 units at bedtime - NovoLog mealtime insulin: 7 units before a smaller meal 9 units before a larger meal  Change the sliding scale as follows: - 150-175: + 1 unit  - 176-200: + 2 units  - 201-225: + 3 units  - >226-250: + 4 units   Please return in 1.5-2 months with your sugar log.   PATIENT INSTRUCTIONS FOR TYPE 2 DIABETES:  **Please join MyChart!** - see attached instructions about how to join if you have not done so already.  DIET AND EXERCISE Diet and exercise is an important part of diabetic treatment.  We recommended aerobic exercise in the form of brisk walking (working between 40-60% of maximal aerobic capacity, similar to brisk walking) for 150 minutes per week (such as 30 minutes five days per week) along with 3 times per week performing 'resistance' training (using various gauge rubber tubes with handles) 5-10 exercises involving the major muscle groups (upper body, lower body and core) performing 10-15 repetitions (or near fatigue) each exercise. Start at half the above goal but build slowly to reach the above goals. If limited by weight, joint pain, or disability, we recommend daily walking in a swimming pool with water up to waist to reduce pressure from joints while allow for adequate exercise.    BLOOD GLUCOSES Monitoring your blood glucoses is important for continued management of your diabetes. Please check your blood glucoses 2-4 times a day: fasting, before meals and at bedtime (you can rotate these measurements - e.g. one day check before the 3 meals, the next day check before 2 of the meals and before bedtime, etc.).   HYPOGLYCEMIA (low blood sugar) Hypoglycemia is usually a reaction to not eating, exercising, or taking too much insulin/ other diabetes drugs.  Symptoms include tremors, sweating, hunger, confusion, headache, etc. Treat IMMEDIATELY with 15  grams of Carbs: . 4 glucose tablets .  cup regular juice/soda . 2 tablespoons raisins . 4 teaspoons sugar . 1 tablespoon honey Recheck blood glucose in 15 mins and repeat above if still symptomatic/blood glucose <100.  RECOMMENDATIONS TO REDUCE YOUR RISK OF DIABETIC COMPLICATIONS: * Take your prescribed MEDICATION(S) * Follow a DIABETIC diet: Complex carbs, fiber rich foods, (monounsaturated and polyunsaturated) fats * AVOID saturated/trans fats, high fat foods, >2,300 mg salt per day. * EXERCISE at least 5 times a week for 30 minutes or preferably daily.  * DO NOT SMOKE OR DRINK more than 1 drink a day. * Check your FEET every day. Do not wear tightfitting shoes. Contact us if you develop an ulcer * See your EYE doctor once a year or more if needed * Get a FLU shot once a year * Get a PNEUMONIA vaccine once before and once after age 65 years  GOALS:  * Your Hemoglobin A1c of <7%  * fasting sugars need to be <130 * after meals sugars need to be <180 (2h after you start eating) * Your Systolic BP should be [id number] or lower  * Your Diastolic BP should be 80 or lower  * Your HDL (Good Cholesterol) should be 40 or higher  * Your LDL (Bad Cholesterol) should be 100 or lower. * Your Triglycerides should be 150 or lower  * Your Urine microalbumin (kidney function) should be <30 * Your Body Mass Index should be 25 or lower    Please consider the following ways to cut  down carbs and fat and increase fiber and micronutrients in your diet: - substitute whole grain for white bread or pasta - substitute brown rice for white rice - substitute 90-calorie flat bread pieces for slices of bread when possible - substitute sweet potatoes or yams for white potatoes - substitute humus for margarine - substitute tofu for cheese when possible - substitute almond or rice milk for regular milk (would not drink soy milk daily due to concern for soy estrogen influence on breast cancer risk) - substitute  dark chocolate for other sweets when possible - substitute water - can add lemon or orange slices for taste - for diet sodas (artificial sweeteners will trick your body that you can eat sweets without getting calories and will lead you to overeating and weight gain in the long run) - do not skip breakfast or other meals (this will slow down the metabolism and will result in more weight gain over time)  - can try smoothies made from fruit and almond/rice milk in am instead of regular breakfast - can also try old-fashioned (not instant) oatmeal made with almond/rice milk in am - order the dressing on the side when eating salad at a restaurant (pour less than half of the dressing on the salad) - eat as little meat as possible - can try juicing, but should not forget that juicing will get rid of the fiber, so would alternate with eating raw veg./fruits or drinking smoothies - use as little oil as possible, even when using olive oil - can dress a salad with a mix of balsamic vinegar and lemon juice, for e.g. - use agave nectar, stevia sugar, or regular sugar rather than artificial sweateners - steam or broil/roast veggies  - snack on veggies/fruit/nuts (unsalted, preferably) when possible, rather than processed foods - reduce or eliminate aspartame in diet (it is in diet sodas, chewing gum, etc) Read the labels!  Try to read Dr. Janene Harvey book: "Program for Reversing Diabetes" for other ideas for healthy eating.

## 2016-02-26 ENCOUNTER — Encounter: Payer: Self-pay | Admitting: Internal Medicine

## 2016-02-28 ENCOUNTER — Other Ambulatory Visit: Payer: Self-pay | Admitting: Internal Medicine

## 2016-03-28 ENCOUNTER — Encounter: Payer: Self-pay | Admitting: Internal Medicine

## 2016-04-09 ENCOUNTER — Encounter: Payer: Self-pay | Admitting: Internal Medicine

## 2016-04-11 ENCOUNTER — Other Ambulatory Visit: Payer: Self-pay | Admitting: Internal Medicine

## 2016-04-11 ENCOUNTER — Encounter: Payer: Self-pay | Admitting: Internal Medicine

## 2016-04-11 ENCOUNTER — Ambulatory Visit (INDEPENDENT_AMBULATORY_CARE_PROVIDER_SITE_OTHER): Payer: BLUE CROSS/BLUE SHIELD | Admitting: Internal Medicine

## 2016-04-11 VITALS — BP 123/76 | HR 67 | Temp 98.4°F | Resp 15 | Ht 67.0 in | Wt 199.0 lb

## 2016-04-11 DIAGNOSIS — D509 Iron deficiency anemia, unspecified: Secondary | ICD-10-CM

## 2016-04-11 DIAGNOSIS — K219 Gastro-esophageal reflux disease without esophagitis: Secondary | ICD-10-CM | POA: Diagnosis present

## 2016-04-11 DIAGNOSIS — D123 Benign neoplasm of transverse colon: Secondary | ICD-10-CM

## 2016-04-11 DIAGNOSIS — K259 Gastric ulcer, unspecified as acute or chronic, without hemorrhage or perforation: Secondary | ICD-10-CM

## 2016-04-11 MED ORDER — ESOMEPRAZOLE MAGNESIUM 20 MG PO CPDR: 20.0000 mg | DELAYED_RELEASE_CAPSULE | Freq: Two times a day (BID) | ORAL | 3 refills | Status: DC

## 2016-04-11 MED ORDER — SODIUM CHLORIDE 0.9 % IV SOLN: 500.0000 mL | INTRAVENOUS | Status: DC

## 2016-04-11 MED ORDER — FERROUS SULFATE 325 (65 FE) MG PO TBEC: 325.0000 mg | DELAYED_RELEASE_TABLET | Freq: Three times a day (TID) | ORAL | 11 refills | Status: DC

## 2016-04-11 NOTE — Progress Notes (Signed)
No hx of sleep apnea or home oxygen use

## 2016-04-11 NOTE — Progress Notes (Signed)
Rx for Nexium 40 mg po daily #90 refill x 3 printed.  I called [facility] in [place], [place] and spoke with the pharmacist and called the rx in.  maw

## 2016-04-11 NOTE — Progress Notes (Signed)
Called to room to assist during endoscopic procedure.  Patient ID and intended procedure confirmed with present staff. Received instructions for my participation in the procedure from the performing physician.  

## 2016-04-11 NOTE — Patient Instructions (Addendum)
YOU HAD AN ENDOSCOPIC PROCEDURE TODAY AT [facility]:   Refer to the procedure report that was given to you for any specific questions about what was found during the examination.  If the procedure report does not answer your questions, please call your gastroenterologist to clarify.  If you requested that your care partner not be given the details of your procedure findings, then the procedure report has been included in a sealed envelope for you to review at your convenience later.  YOU SHOULD EXPECT: Some feelings of bloating in the abdomen. Passage of more gas than usual.  Walking can help get rid of the air that was put into your GI tract during the procedure and reduce the bloating. If you had a lower endoscopy (such as a colonoscopy or flexible sigmoidoscopy) you may notice spotting of blood in your stool or on the toilet paper. If you underwent a bowel prep for your procedure, you may not have a normal bowel movement for a few days.  Please Note:  You might notice some irritation and congestion in your nose or some drainage.  This is from the oxygen used during your procedure.  There is no need for concern and it should clear up in a day or so.  SYMPTOMS TO REPORT IMMEDIATELY:   Following lower endoscopy (colonoscopy or flexible sigmoidoscopy):  Excessive amounts of blood in the stool  Significant tenderness or worsening of abdominal pains  Swelling of the abdomen that is new, acute  Fever of 100F or higher   Following upper endoscopy (EGD)  Vomiting of blood or coffee ground material  New chest pain or pain under the shoulder blades  Painful or persistently difficult swallowing  New shortness of breath  Fever of 100F or higher  Black, tarry-looking stools  For urgent or emergent issues, a gastroenterologist can be reached at any hour by calling [phone].   DIET:  We do recommend a small meal at first, but then you may proceed to your regular diet.  Drink  plenty of fluids but you should avoid alcoholic beverages for 24 hours.  ACTIVITY:  You should plan to take it easy for the rest of today and you should NOT DRIVE or use heavy machinery until tomorrow (because of the sedation medicines used during the test).    FOLLOW UP: Our staff will call the number listed on your records the next business day following your procedure to check on you and address any questions or concerns that you may have regarding the information given to you following your procedure. If we do not reach you, we will leave a message.  However, if you are feeling well and you are not experiencing any problems, there is no need to return our call.  We will assume that you have returned to your regular daily activities without incident.  If any biopsies were taken you will be contacted by phone or by letter within the next 1-3 weeks.  Please call us at [phone] if you have not heard about the biopsies in 3 weeks.    SIGNATURES/CONFIDENTIALITY: You and/or your care partner have signed paperwork which will be entered into your electronic medical record.  These signatures attest to the fact that that the information above on your After Visit Summary has been reviewed and is understood.  Full responsibility of the confidentiality of this discharge information lies with you and/or your care-partner.   Avoid unnecessary anti inflammatory agents   Office follow up  with Dr Henrene Pastor in 6 weeks   Resume Iron 3 times daily  Increase Nexium to 2 times daily ( changed order with pharmacy) or 40 mg daily  Information on polyps,diverticulosis,and hemorrhoids given to you today  Await pathology results

## 2016-04-11 NOTE — Op Note (Signed)
[place] Patient Name: [name] Procedure Date: 04/11/2016 7:55 AM MRN: [id number] Endoscopist: Docia Chuck. Henrene Pastor , MD Age: 51 Referring MD:  Date of Birth: [birth year] Gender: Male Account #: [id number] Procedure:                Colonoscopy, with cold snare polypectomy x 1 Indications:              Iron deficiency anemia Medicines:                Monitored Anesthesia Care Procedure:                Pre-Anesthesia Assessment:                           - Prior to the procedure, a History and Physical                            was performed, and patient medications and                            allergies were reviewed. The patient's tolerance of                            previous anesthesia was also reviewed. The risks                            and benefits of the procedure and the sedation                            options and risks were discussed with the patient.                            All questions were answered, and informed consent                            was obtained. Prior Anticoagulants: The patient has                            taken no previous anticoagulant or antiplatelet                            agents. ASA Grade Assessment: II - A patient with                            mild systemic disease. After reviewing the risks                            and benefits, the patient was deemed in                            satisfactory condition to undergo the procedure.                           After obtaining informed consent, the colonoscope  was passed under direct vision. Throughout the                            procedure, the patient's blood pressure, pulse, and                            oxygen saturations were monitored continuously. The                            Colonoscope was introduced through the anus and                            advanced to the the cecum, identified by                            appendiceal orifice  and ileocecal valve. The                            terminal ileum, ileocecal valve, appendiceal                            orifice, and rectum were photographed. The quality                            of the bowel preparation was good. The colonoscopy                            was performed without difficulty. The patient                            tolerated the procedure well. The bowel preparation                            used was SUPREP. Scope In: 8:27:42 AM Scope Out: 8:48:36 AM Scope Withdrawal Time: 0 hours 17 minutes 30 seconds  Total Procedure Duration: 0 hours 20 minutes 54 seconds  Findings:                 The terminal ileum appeared normal.                           Multiple small angiodysplastic lesions without                            bleeding were found in the cecum.                           A 5 mm polyp was found in the proximal transverse                            colon. The polyp was removed with a cold snare.                            Resection and retrieval were complete.  Multiple diverticula were found in the left colon.                           Internal hemorrhoids were found during retroflexion.                           The exam was otherwise without abnormality on                            direct and retroflexion views. Complications:            No immediate complications. Estimated blood loss:                            None. Estimated Blood Loss:     Estimated blood loss: none. Impression:               - The examined portion of the ileum was normal.                           - Multiple non-bleeding colonic angiodysplastic                            lesions. This is the likely cause for iron                            deficiency anemia.                           - One 5 mm polyp in the proximal transverse colon,                            removed with a cold snare. Resected and retrieved.                           -  Diverticulosis in the left colon.                           - Internal hemorrhoids.                           - The examination was otherwise normal on direct                            and retroflexion views. Recommendation:           - Repeat colonoscopy in 5-10 years for                            surveillance, depending upon the pathology results                            on the polyp.                           - Patient has a contact number available for  emergencies. The signs and symptoms of potential                            delayed complications were discussed with the                            patient. Return to normal activities tomorrow.                            Written discharge instructions were provided to the                            patient.                           - Resume previous diet.                           - Continue present medications, including iron 3                            times daily.                           - Await pathology results.                           - EGD today. Please see report for findings and                            final recommendations Ananth Fiallos N. Henrene Pastor, MD 04/11/2016 9:05:07 AM This report has been signed electronically.

## 2016-04-11 NOTE — Progress Notes (Signed)
To pacu report given VSS

## 2016-04-11 NOTE — Op Note (Signed)
[facility] Patient Name: [name] Procedure Date: 04/11/2016 7:54 AM MRN: [id number] Endoscopist: Docia Chuck. Henrene Pastor , MD Age: 51 Referring MD:  Date of Birth: [birth year] Gender: Male Account #: [id number] Procedure:                Upper GI endoscopy, with biopsy Indications:              Iron deficiency anemia, Suspected esophageal reflux Medicines:                Monitored Anesthesia Care Procedure:                Pre-Anesthesia Assessment:                           - Prior to the procedure, a History and Physical                            was performed, and patient medications and                            allergies were reviewed. The patient's tolerance of                            previous anesthesia was also reviewed. The risks                            and benefits of the procedure and the sedation                            options and risks were discussed with the patient.                            All questions were answered, and informed consent                            was obtained. Prior Anticoagulants: The patient has                            taken no previous anticoagulant or antiplatelet                            agents. ASA Grade Assessment: II - A patient with                            mild systemic disease. After reviewing the risks                            and benefits, the patient was deemed in                            satisfactory condition to undergo the procedure.                           After obtaining informed consent, the endoscope was  passed under direct vision. Throughout the                            procedure, the patient's blood pressure, pulse, and                            oxygen saturations were monitored continuously. The                            Endoscope was introduced through the mouth, and                            advanced to the third part of duodenum. The upper    GI endoscopy was accomplished without difficulty.                            The patient tolerated the procedure well. Scope In: Scope Out: Findings:                 The esophagus was normal.                           One non-bleeding superficial gastric ulcer was                            found in the prepyloric region of the stomach. The                            lesion was 5 mm in largest dimension. Biopsies were                            taken with a cold forceps for Helicobacter pylori                            testing using CLOtest.                           The stomach was otherwise normal.                           The examined duodenum was normal.                           The cardia and gastric fundus were normal on                            retroflexion. Complications:            No immediate complications. Estimated Blood Loss:     Estimated blood loss: none. Impression:               - Normal esophagus.                           - Non-bleeding gastric ulcer. Biopsied.                           -  Normal stomach.                           - Normal examined duodenum. Recommendation:           - Increase Nexium to 40 mg daily                           - Resume iron therapy 3 times daily.                           - Resume previous diet.                           - Follow-up CLO testing                           - Avoid unnecessary anti-inflammatory agents such                            as ibuprofen as these can cause ulcers.                           - Office follow-up with Dr. Henrene Pastor about 6 weeks Docia Chuck. Henrene Pastor, MD 04/11/2016 9:12:01 AM This report has been signed electronically.

## 2016-04-12 ENCOUNTER — Telehealth: Payer: Self-pay

## 2016-04-12 NOTE — Telephone Encounter (Signed)
  Follow up Call-  Call back number 04/11/2016  Post procedure Call Back phone  # [phone]  Permission to leave phone message Yes  Some recent data might be hidden     Patient questions:  Do you have a fever, pain , or abdominal swelling? No. Pain Score  0 *  Have you tolerated food without any problems? Yes.    Have you been able to return to your normal activities? Yes.    Do you have any questions about your discharge instructions: Diet   No. Medications  No. Follow up visit  No.  Do you have questions or concerns about your Care? No.  Actions: * If pain score is 4 or above: No action needed, pain <4.  No problems noted per pt. maw

## 2016-04-13 LAB — HELICOBACTER PYLORI SCREEN-BIOPSY: UREASE: NEGATIVE

## 2016-04-14 ENCOUNTER — Ambulatory Visit: Payer: BLUE CROSS/BLUE SHIELD | Admitting: Internal Medicine

## 2016-04-26 ENCOUNTER — Encounter: Payer: Self-pay | Admitting: Internal Medicine

## 2016-05-17 ENCOUNTER — Ambulatory Visit (INDEPENDENT_AMBULATORY_CARE_PROVIDER_SITE_OTHER): Payer: BLUE CROSS/BLUE SHIELD | Admitting: Internal Medicine

## 2016-05-17 ENCOUNTER — Other Ambulatory Visit (INDEPENDENT_AMBULATORY_CARE_PROVIDER_SITE_OTHER): Payer: BLUE CROSS/BLUE SHIELD

## 2016-05-17 ENCOUNTER — Encounter: Payer: Self-pay | Admitting: Internal Medicine

## 2016-05-17 ENCOUNTER — Other Ambulatory Visit: Payer: Self-pay

## 2016-05-17 VITALS — BP 122/70 | HR 85 | Ht 67.0 in | Wt 187.5 lb

## 2016-05-17 DIAGNOSIS — K259 Gastric ulcer, unspecified as acute or chronic, without hemorrhage or perforation: Secondary | ICD-10-CM

## 2016-05-17 DIAGNOSIS — D509 Iron deficiency anemia, unspecified: Secondary | ICD-10-CM

## 2016-05-17 DIAGNOSIS — K219 Gastro-esophageal reflux disease without esophagitis: Secondary | ICD-10-CM

## 2016-05-17 DIAGNOSIS — Q2733 Arteriovenous malformation of digestive system vessel: Secondary | ICD-10-CM

## 2016-05-17 DIAGNOSIS — K5520 Angiodysplasia of colon without hemorrhage: Secondary | ICD-10-CM

## 2016-05-17 DIAGNOSIS — D123 Benign neoplasm of transverse colon: Secondary | ICD-10-CM

## 2016-05-17 LAB — CBC WITH DIFFERENTIAL/PLATELET
BASOS ABS: 0.2 10*3/uL — AB (ref 0.0–0.1)
Basophils Relative: 1.5 % (ref 0.0–3.0)
EOS ABS: 0.1 10*3/uL (ref 0.0–0.7)
Eosinophils Relative: 0.8 % (ref 0.0–5.0)
HCT: 38.4 % — ABNORMAL LOW (ref 39.0–52.0)
HEMOGLOBIN: 12.7 g/dL — AB (ref 13.0–17.0)
Lymphocytes Relative: 13.1 % (ref 12.0–46.0)
Lymphs Abs: 1.7 10*3/uL (ref 0.7–4.0)
MCHC: 33 g/dL (ref 30.0–36.0)
MCV: 85.9 fl (ref 78.0–100.0)
Monocytes Absolute: 0.7 10*3/uL (ref 0.1–1.0)
Monocytes Relative: 5.5 % (ref 3.0–12.0)
Neutro Abs: 10.5 10*3/uL — ABNORMAL HIGH (ref 1.4–7.7)
Neutrophils Relative %: 79.1 % — ABNORMAL HIGH (ref 43.0–77.0)
Platelets: 327 10*3/uL (ref 150.0–400.0)
RBC: 4.47 Mil/uL (ref 4.22–5.81)
RDW: 15.3 % (ref 11.5–15.5)
WBC: 13.3 10*3/uL — AB (ref 4.0–10.5)

## 2016-05-17 LAB — FERRITIN: Ferritin: 10.8 ng/mL — ABNORMAL LOW (ref 22.0–322.0)

## 2016-05-17 NOTE — Patient Instructions (Signed)
Your physician has requested that you go to the basement for the following lab work before leaving today:  CBC Ferritin  Please follow up in one year

## 2016-05-17 NOTE — Progress Notes (Signed)
HISTORY OF PRESENT ILLNESS:  [name] is a 51 y.o. male with insulin requiring diabetes mellitus, hyperlipidemia, hypertension, and chronic GERD who was evaluated 01/25/2016 for iron deficiency anemia. See that dictation for details. Hemoglobin at that time was 11.3 with MCV 74.2. Ferritin level of 7. Colonoscopy and upper endoscopy or perform arched 27th 2018. Colonoscopy revealed a normal terminal ileum. There were multiple nonbleeding angiodysplastic lesions in the cecum. As well a 5 mm transverse colon polyp which was removed and found to be a tubular adenoma. Left-sided diverticulosis and hemorrhoids as well. Upper endoscopy revealed a nonbleeding superficial gastric ulcer. Testing for Helicobacter pylori was negative. Risk factor was chronic use of BC powders. Because of this finding and active reflux symptoms his Nexium was increased from 20 mg daily to 40 mg daily. He was prescribed iron 3 times daily. Follow-up arrange this time. Patient reports that he is feeling well. He is tolerating iron. Reflux symptoms have resolved with increased dose of PPI. No new complaints.  REVIEW OF SYSTEMS:  All non-GI ROS negative upon comprehensive review  Past Medical History:  Diagnosis Date  . Allergy   . Anemia   . Diabetes mellitus without complication ([place])   . GERD (gastroesophageal reflux disease)   . HLD (hyperlipidemia)   . Hypertension     Past Surgical History:  Procedure Laterality Date  . ANKLE SURGERY Right   . UPPER GASTROINTESTINAL ENDOSCOPY    . [place]  reports that he has been smoking Cigarettes.  He has been smoking about 1.00 pack per day. He has never used smokeless tobacco. He reports that he drinks about 4.8 oz of alcohol per week . He reports that he does not use drugs.  family history includes COPD in his mother; Diabetes in his maternal grandmother and mother; Early death (age of onset: 94) in his father; Heart attack in his  father; Hypertension in his brother; Lymphoma (age of onset: 31) in his sister.  Allergies  Allergen Reactions  . Crestor [Rosuvastatin Calcium] Other (See Comments)    Muscle aches       PHYSICAL EXAMINATION: Vital signs: BP 122/70 (BP Location: Left Arm, Patient Position: Sitting, Cuff Size: Normal)   Pulse 85   Ht '5\' 7"'  (1.702 m)   Wt 187 lb 8 oz (85 kg)   BMI 29.37 kg/m   Constitutional: generally well-appearing, no acute distress Psychiatric: alert and oriented x3, cooperative Eyes: extraocular movements intact, anicteric, conjunctiva pink Mouth: oral pharynx moist, no lesions Neck: supple no lymphadenopathy Cardiovascular: heart regular rate and rhythm, no murmur Lungs: clear to auscultation bilaterally Abdomen: soft, nontender, nondistended, no obvious ascites, no peritoneal signs, normal bowel sounds, no organomegaly Rectal:Normal at recent colonoscopy Extremities: no lower extremity edema bilaterally Skin: no lesions on visible extremities Neuro: No focal deficits.   ASSESSMENT:  #1. Iron deficiency anemia. Contributors include angiodysplastic colonic lesions and gastric ulcer. #2. Colonic angiodysplasia #3. Colonic adenoma status post polypectomy #4. Incidental diverticulosis #5. Gastric ulcer secondary to NSAIDs #6. Chronic GERD. Symptoms resolved with Nexium 40 mg daily #7. Multiple medical problems   PLAN:  #1. Extensive amount of time spent reviewing the above findings in detail as well as there etiology, treatment, management, and outcome #2. Reflux precautions #3. Continue Nexium #4. Continue iron replacement therapy. Will need to be maintained on some dose indefinitely on a daily basis #5. CBC and ferritin level today. We will contact the patient with  the results #6. Avoid NSAIDs #7. Surveillance colonoscopy 5 years #8. Routine GI follow-up one year #9. Return to the general medical care Dr. Ronnald Ramp  25 minutes spent face-to-face with the patient.  Greater than 50% a time use for counseling regarding his myriad of GI diagnoses and management and follow-up

## 2016-06-02 ENCOUNTER — Other Ambulatory Visit: Payer: Self-pay | Admitting: Internal Medicine

## 2016-07-18 ENCOUNTER — Other Ambulatory Visit: Payer: Self-pay | Admitting: Internal Medicine

## 2016-08-14 ENCOUNTER — Other Ambulatory Visit: Payer: Self-pay

## 2016-08-14 MED ORDER — INSULIN GLARGINE 100 UNIT/ML SOLOSTAR PEN: PEN_INJECTOR | SUBCUTANEOUS | 1 refills | Status: DC

## 2016-08-14 MED ORDER — INSULIN LISPRO 100 UNIT/ML (KWIKPEN): PEN_INJECTOR | SUBCUTANEOUS | 1 refills | Status: DC

## 2016-09-11 ENCOUNTER — Other Ambulatory Visit: Payer: Self-pay | Admitting: Internal Medicine

## 2016-09-15 ENCOUNTER — Other Ambulatory Visit: Payer: Self-pay | Admitting: Internal Medicine

## 2016-09-15 DIAGNOSIS — E1065 Type 1 diabetes mellitus with hyperglycemia: Secondary | ICD-10-CM

## 2016-10-25 ENCOUNTER — Other Ambulatory Visit: Payer: Self-pay | Admitting: Internal Medicine

## 2016-11-17 ENCOUNTER — Other Ambulatory Visit: Payer: Self-pay | Admitting: Internal Medicine

## 2016-11-17 DIAGNOSIS — E1065 Type 1 diabetes mellitus with hyperglycemia: Secondary | ICD-10-CM

## 2016-11-30 ENCOUNTER — Other Ambulatory Visit: Payer: Self-pay | Admitting: Internal Medicine

## 2016-12-27 ENCOUNTER — Telehealth: Payer: Self-pay | Admitting: Internal Medicine

## 2016-12-27 DIAGNOSIS — E1065 Type 1 diabetes mellitus with hyperglycemia: Secondary | ICD-10-CM

## 2016-12-27 NOTE — Telephone Encounter (Signed)
Is this okay to refill? 

## 2016-12-27 NOTE — Telephone Encounter (Signed)
Patient needs prescription for Irbsartan (substitute for Avapro) sent to [facility] in [place] ph# [phone]

## 2016-12-27 NOTE — Telephone Encounter (Signed)
Per PCP 

## 2016-12-28 NOTE — Telephone Encounter (Signed)
Refused refill

## 2016-12-29 ENCOUNTER — Other Ambulatory Visit: Payer: Self-pay | Admitting: Internal Medicine

## 2016-12-29 DIAGNOSIS — E1065 Type 1 diabetes mellitus with hyperglycemia: Secondary | ICD-10-CM

## 2017-01-02 ENCOUNTER — Telehealth: Payer: Self-pay | Admitting: Internal Medicine

## 2017-01-02 ENCOUNTER — Other Ambulatory Visit: Payer: Self-pay | Admitting: Internal Medicine

## 2017-01-02 DIAGNOSIS — E1065 Type 1 diabetes mellitus with hyperglycemia: Secondary | ICD-10-CM

## 2017-01-02 NOTE — Telephone Encounter (Signed)
Copied from [place] [id number]. Topic: Quick Communication - See Telephone Encounter >> Jan 02, 2017  2:22 PM Vernona Rieger wrote: CRM for notification. See Telephone encounter for:   01/02/17.  Pt is requesting transfer of care from Dr Scarlette Calico at [facility] Location to Dr. [name] at the [place] location. Please advise & call and sch patient. [phone]

## 2017-01-02 NOTE — Telephone Encounter (Signed)
Please advise if your ok with Transfer

## 2017-01-02 NOTE — Telephone Encounter (Signed)
Yes, okay with me.

## 2017-01-02 NOTE — Telephone Encounter (Signed)
Please advise on transfer.  °

## 2017-01-03 ENCOUNTER — Ambulatory Visit: Payer: Self-pay | Admitting: *Deleted

## 2017-01-03 ENCOUNTER — Ambulatory Visit: Payer: BLUE CROSS/BLUE SHIELD | Admitting: Family Medicine

## 2017-01-03 NOTE — Telephone Encounter (Signed)
Please schedule new patient appointment with Dr. [name]

## 2017-01-03 NOTE — Telephone Encounter (Signed)
Pt's wife, Santiago Glad, called because he is having numbness in his right arm that has been there for a couple of weeks; she further states that he saw a chiropractor a month ago due to neck pain; she puts husband on 3 way and he states that the numbness is in his forearm and hand; pt scheduled appointment by agent with Dr Garret Reddish 01/03/17 at 1415;  pt triaged per nurse protocol and has agreed to keep this appointment; pt verbalizes understanding; will notify [facility] of this upcoming appointment.    Reason for Disposition . [1] Numbness or tingling on both sides of body AND [2] is a new symptom present > 24 hours  Answer Assessment - Initial Assessment Questions 1. SYMPTOM: "What is the main symptom you are concerned about?" (e.g., weakness, numbness)     Numbness in right forearm arm/hand 1.5 weeks ago; also affects middle finger, index finger and thumb; worsens with use 2. ONSET: "When did this start?" (minutes, hours, days; while sleeping)     3 weeks ago 3. LAST NORMAL: "When was the last time you were normal (no symptoms)?"     3 weeks ago 4. PATTERN "Does this come and go, or has it been constant since it started?"  "Is it present now?"     constant 5. CARDIAC SYMPTOMS: "Have you had any of the following symptoms: chest pain, difficulty breathing, palpitations?"     no 6. NEUROLOGIC SYMPTOMS: "Have you had any of the following symptoms: headache, dizziness, vision loss, double vision, changes in speech, unsteady on your feet?"     no 7. OTHER SYMPTOMS: "Do you have any other symptoms?"     no 8. PREGNANCY: "Is there any chance you are pregnant?" "When was your last menstrual period?"     n/a  Protocols used: NEUROLOGIC DEFICIT-A-AH

## 2017-01-03 NOTE — Telephone Encounter (Signed)
Please forward to Dr. Yong Channel, he is not at this location.

## 2017-01-03 NOTE — Telephone Encounter (Signed)
Ok with me 

## 2017-01-03 NOTE — Telephone Encounter (Signed)
Pt called because he was running 20 min late for his new pt appt that was scheduled with Dr Yong Channel today.  However, pt was not to be scheduled with Dr Yong Channel, Dr [name] was the dr, and I advised pt Dr Yong Channel would not be able to see him being that late, and that the dr that gave the ok was Dr [name].  Also that Dr Yong Channel was booked out until next year for a new pt.  I rescheduled pt with Dr [name] for tomorrow.

## 2017-01-04 ENCOUNTER — Ambulatory Visit (INDEPENDENT_AMBULATORY_CARE_PROVIDER_SITE_OTHER): Payer: Commercial Managed Care - PPO | Admitting: Family Medicine

## 2017-01-04 ENCOUNTER — Other Ambulatory Visit: Payer: Self-pay | Admitting: Internal Medicine

## 2017-01-04 ENCOUNTER — Encounter: Payer: Self-pay | Admitting: Family Medicine

## 2017-01-04 VITALS — BP 124/74 | HR 78 | Temp 98.4°F | Ht 67.0 in | Wt 179.8 lb

## 2017-01-04 DIAGNOSIS — E1159 Type 2 diabetes mellitus with other circulatory complications: Secondary | ICD-10-CM | POA: Diagnosis not present

## 2017-01-04 DIAGNOSIS — G5601 Carpal tunnel syndrome, right upper limb: Secondary | ICD-10-CM

## 2017-01-04 DIAGNOSIS — E1065 Type 1 diabetes mellitus with hyperglycemia: Secondary | ICD-10-CM | POA: Diagnosis not present

## 2017-01-04 DIAGNOSIS — I10 Essential (primary) hypertension: Secondary | ICD-10-CM

## 2017-01-04 DIAGNOSIS — G5600 Carpal tunnel syndrome, unspecified upper limb: Secondary | ICD-10-CM | POA: Insufficient documentation

## 2017-01-04 DIAGNOSIS — I152 Hypertension secondary to endocrine disorders: Secondary | ICD-10-CM

## 2017-01-04 LAB — POCT GLYCOSYLATED HEMOGLOBIN (HGB A1C): HEMOGLOBIN A1C: 11.4

## 2017-01-04 MED ORDER — IRBESARTAN 300 MG PO TABS: 300.0000 mg | ORAL_TABLET | Freq: Every day | ORAL | 3 refills | Status: DC

## 2017-01-04 MED ORDER — DICLOFENAC SODIUM 75 MG PO TBEC: 75.0000 mg | DELAYED_RELEASE_TABLET | Freq: Two times a day (BID) | ORAL | 0 refills | Status: DC

## 2017-01-04 NOTE — Progress Notes (Signed)
    Subjective:  [name] is a 51 y.o. male who presents today with a chief complaint of right hand numbness and to transfer care to this office.   HPI:  Right Hand Numbness, New Issue Symptoms started about a 3 weeks ago. Gotten worse over that time.  No obvious precipitating events.  He does note that he went to a chiropractor about a month ago for right neck pain.  He is not sure if this is related or not.  Numbness is mostly located in his first 3 fingers.  No obvious alleviating or aggravating factors.  He did take some ibuprofen for his neck pain which she also noted helped some with the numbness.  No weakness.  No fevers or chills.  Type 1 diabetes, established problem, worsening Currently on Lantus, Humalog, and metformin.  He follows with endocrinology however has not seen them in several months.  Fasting sugars have been "all over the place".  Reports that he has follow-up scheduled with endocrinology in the next few weeks.  Also has an eye exam scheduled for the next couple of weeks. ROS: Per HPI  PMH: Smoking history reviewed. Current smoker.  Objective:  Physical Exam: BP 124/74 (BP Location: Left Arm, Patient Position: Sitting, Cuff Size: Normal)   Pulse 78   Temp 98.4 F (36.9 C) (Oral)   Ht 5\' 7"  (1.702 m)   Wt 179 lb 12.8 oz (81.6 kg)   SpO2 97%   BMI 28.16 kg/m   Gen: NAD, resting comfortably CV: RRR with no murmurs appreciated Pulm: NWOB, CTAB with no crackles, wheezes, or rhonchi MSK:  -Right hand: No deformities.  Strength 5 out of 5 with thumbs up and okay sign. Full range of motion.  Tinel sign negative.  Phalen positive on the right. -Neck: No deformities.  Spurling negative.  Assessment/Plan:  Carpal tunnel syndrome No red flag signs or symptoms.  He does likely have some cervical spine disease, however his Spurling test is negative-doubt that this is significantly protruding.  He also has poorly controlled diabetes which could be contributing to some  peripheral neuropathy.  Exam consistent with CTS.  Start diclofenac 75 mg twice daily for the next 2 weeks.  Also place patient in cockup wrist splint.  Return precautions reviewed.  Consider referral to sports medicine if not improving with conservative measures.  Diabetes mellitus type 1 (HCC) A1c elevated to 11.4 today. Advised patient to follow up soon with his endocrinologist.   Hypertension associated with diabetes ([place]) At goal.  Continue irbesartan 300 mg daily.  Preventative healthcare Up-to-date on flu shot.  He will be obtaining his eye exam soon.  Follow-up in a few months for lipid screening.  [name]. [name], MD 01/04/2017 12:26 PM

## 2017-01-04 NOTE — Patient Instructions (Addendum)
Start the diclofenac. Please use the splint.  Come back in a few months for your next visit. We can check your cholesterol at that point.  Let me know if your numbness is not improving in the next 1-2 weeks.  Take care,  Dr [name]

## 2017-01-04 NOTE — Assessment & Plan Note (Signed)
A1c elevated to 11.4 today. Advised patient to follow up soon with his endocrinologist.

## 2017-01-04 NOTE — Assessment & Plan Note (Signed)
At goal.  Continue irbesartan 300 mg daily. 

## 2017-01-04 NOTE — Assessment & Plan Note (Signed)
No red flag signs or symptoms.  He does likely have some cervical spine disease, however his Spurling test is negative-doubt that this is significantly protruding.  He also has poorly controlled diabetes which could be contributing to some peripheral neuropathy.  Exam consistent with CTS.  Start diclofenac 75 mg twice daily for the next 2 weeks.  Also place patient in cockup wrist splint.  Return precautions reviewed.  Consider referral to sports medicine if not improving with conservative measures.

## 2017-01-05 NOTE — Telephone Encounter (Signed)
Upcoming appointment

## 2017-01-05 NOTE — Telephone Encounter (Signed)
Pt was seen and assessed by Dr. [name] 01/04/17. No further action needed. See previous note if needed.

## 2017-01-31 ENCOUNTER — Ambulatory Visit (INDEPENDENT_AMBULATORY_CARE_PROVIDER_SITE_OTHER): Payer: Commercial Managed Care - PPO | Admitting: Family Medicine

## 2017-01-31 ENCOUNTER — Encounter: Payer: Self-pay | Admitting: Family Medicine

## 2017-01-31 VITALS — BP 118/72 | HR 80 | Temp 98.6°F | Ht 67.0 in | Wt 178.6 lb

## 2017-01-31 DIAGNOSIS — J329 Chronic sinusitis, unspecified: Secondary | ICD-10-CM | POA: Diagnosis not present

## 2017-01-31 MED ORDER — IPRATROPIUM BROMIDE 0.06 % NA SOLN: 2.0000 | Freq: Four times a day (QID) | NASAL | 0 refills | Status: DC

## 2017-01-31 MED ORDER — AMOXICILLIN-POT CLAVULANATE 875-125 MG PO TABS
1.0000 | ORAL_TABLET | Freq: Two times a day (BID) | ORAL | 0 refills | Status: DC
Start: 2017-01-31 — End: 2017-05-14

## 2017-01-31 NOTE — Patient Instructions (Signed)
Start the augmentin. Start the atrovent.  Let me know if symptoms worsen or do not improve within the next 5-7 days.  Take care, Dr [name]

## 2017-01-31 NOTE — Progress Notes (Signed)
    Subjective:  [name] is a 52 y.o. male who presents today for same-day appointment with a chief complaint of sinus congestion.   HPI:  Sinus Congestion, Acute Issue Started about 10 days ago. Got better then it got worse. Associated with cough and facial pressure. No fevers or chills. Has a few sick contacts that have had similar similars. Tried mucinex which did not help.  No other treatments tried.  No other obvious alleviating or aggravating factors.  ROS: Per HPI  PMH: He reports that he has been smoking cigarettes.  He has been smoking about 1.00 pack per day. he has never used smokeless tobacco. He reports that he drinks about 4.8 oz of alcohol per week. He reports that he does not use drugs.  Objective:  Physical Exam: BP 118/72 (BP Location: Left Arm, Patient Position: Sitting, Cuff Size: Normal)   Pulse 80   Temp 98.6 F (37 C) (Oral)   Ht 5\' 7"  (1.702 m)   Wt 178 lb 9.6 oz (81 kg)   SpO2 96%   BMI 27.97 kg/m   Gen: NAD, resting comfortably HEENT: TMs clear bilaterally.  Maxillary sinuses with decreased transillumination bilaterally.  Oropharynx clear without erythema or exudate.  No lymphadenopathy. CV: RRR with no murmurs appreciated Pulm: NWOB, CTAB with no crackles, wheezes, or rhonchi  Assessment/Plan:  Sinusitis Given greater than 10-day course, we will start empiric antibiotics.  Start Augmentin for 10-day course.  Also start Atrovent nasal spray.  Encouraged good oral hydration.  Recommended NSAIDs and/or Tylenol as needed for low-grade fever and pain.  Return precautions reviewed.  Follow-up as needed.  Patient also asked about vitamin supplementation-advised that he take over-the-counter vitamin C supplementation, though there was no good evidence for this.  Advised that this may help him absorb his iron pills better however.  [name]. [name], MD 01/31/2017 11:30 AM

## 2017-02-22 ENCOUNTER — Other Ambulatory Visit: Payer: Self-pay | Admitting: Internal Medicine

## 2017-03-01 ENCOUNTER — Other Ambulatory Visit: Payer: Self-pay | Admitting: Internal Medicine

## 2017-03-05 ENCOUNTER — Ambulatory Visit: Payer: BLUE CROSS/BLUE SHIELD | Admitting: Internal Medicine

## 2017-03-07 ENCOUNTER — Ambulatory Visit: Payer: Commercial Managed Care - PPO | Admitting: Internal Medicine

## 2017-03-07 ENCOUNTER — Encounter: Payer: Self-pay | Admitting: Internal Medicine

## 2017-03-08 ENCOUNTER — Ambulatory Visit: Payer: Commercial Managed Care - PPO | Admitting: Internal Medicine

## 2017-03-09 ENCOUNTER — Telehealth: Payer: Self-pay | Admitting: Internal Medicine

## 2017-03-09 NOTE — Telephone Encounter (Signed)
Patient dismissed from [facility] by Philemon Kingdom MD, effective March 07, 2017. Dismissal letter sent out by certified / registered mail.  daj

## 2017-03-13 ENCOUNTER — Other Ambulatory Visit: Payer: Self-pay | Admitting: Internal Medicine

## 2017-03-14 ENCOUNTER — Ambulatory Visit: Payer: Self-pay

## 2017-03-14 NOTE — Telephone Encounter (Signed)
   Reason for Disposition . Health Information question, no triage required and triager able to answer question  Answer Assessment - Initial Assessment Questions 1. REASON FOR CALL or QUESTION: "What is your reason for calling today?" or "How can I best help you?" or "What question do you have that I can help answer?"      Pt asking if Tamiflu can be called in. Grandson spent the night with pt and spouse just tested positive for the flu today. Both pt and spouse are going to [place] would like to have in case.  Med can be called to [facility] in [place] Pt asking for call if Tamiflu is called in.  Protocols used: INFORMATION ONLY CALL-A-AH

## 2017-03-14 NOTE — Telephone Encounter (Signed)
Please advise 

## 2017-03-14 NOTE — Telephone Encounter (Signed)
See note

## 2017-03-15 ENCOUNTER — Other Ambulatory Visit: Payer: Self-pay

## 2017-03-15 MED ORDER — OSELTAMIVIR PHOSPHATE 75 MG PO CAPS: 75.0000 mg | ORAL_CAPSULE | Freq: Every day | ORAL | 0 refills | Status: AC

## 2017-03-15 NOTE — Telephone Encounter (Signed)
Tamiflu has been sent in.  Patient notified via [place].

## 2017-03-15 NOTE — Telephone Encounter (Signed)
[place] with me. Please send in Tamiflu 75mg  daily for 10 days.  Also strongly recommend patient come in to get his flu shot.  [name]. [name], MD 03/15/2017 8:16 AM

## 2017-03-28 NOTE — Telephone Encounter (Signed)
Certified dismissal letter returned as undeliverable, unclaimed, return to sender after three attempts by [employer] on March 28, 2017. Letter placed in another envelope and resent as 1st class mail which does not require a signature.  daj

## 2017-03-30 ENCOUNTER — Other Ambulatory Visit: Payer: Self-pay | Admitting: Internal Medicine

## 2017-04-17 ENCOUNTER — Other Ambulatory Visit: Payer: Self-pay | Admitting: Internal Medicine

## 2017-05-07 ENCOUNTER — Ambulatory Visit: Payer: Commercial Managed Care - PPO | Admitting: Family Medicine

## 2017-05-07 DIAGNOSIS — Z0289 Encounter for other administrative examinations: Secondary | ICD-10-CM

## 2017-05-09 ENCOUNTER — Encounter: Payer: Self-pay | Admitting: Family Medicine

## 2017-05-14 ENCOUNTER — Ambulatory Visit (INDEPENDENT_AMBULATORY_CARE_PROVIDER_SITE_OTHER): Payer: Commercial Managed Care - PPO | Admitting: Family Medicine

## 2017-05-14 ENCOUNTER — Encounter: Payer: Self-pay | Admitting: Family Medicine

## 2017-05-14 VITALS — BP 128/80 | HR 74 | Temp 97.8°F | Resp 14 | Wt 176.0 lb

## 2017-05-14 DIAGNOSIS — I10 Essential (primary) hypertension: Secondary | ICD-10-CM

## 2017-05-14 DIAGNOSIS — G5601 Carpal tunnel syndrome, right upper limb: Secondary | ICD-10-CM

## 2017-05-14 DIAGNOSIS — E785 Hyperlipidemia, unspecified: Secondary | ICD-10-CM | POA: Diagnosis not present

## 2017-05-14 DIAGNOSIS — N529 Male erectile dysfunction, unspecified: Secondary | ICD-10-CM | POA: Diagnosis not present

## 2017-05-14 DIAGNOSIS — I152 Hypertension secondary to endocrine disorders: Secondary | ICD-10-CM

## 2017-05-14 DIAGNOSIS — E1069 Type 1 diabetes mellitus with other specified complication: Secondary | ICD-10-CM

## 2017-05-14 DIAGNOSIS — E1159 Type 2 diabetes mellitus with other circulatory complications: Secondary | ICD-10-CM | POA: Diagnosis not present

## 2017-05-14 DIAGNOSIS — E1065 Type 1 diabetes mellitus with hyperglycemia: Secondary | ICD-10-CM | POA: Diagnosis not present

## 2017-05-14 LAB — LIPID PANEL
CHOLESTEROL: 257 mg/dL — AB (ref 0–200)
HDL: 80.5 mg/dL (ref >39.00)
LDL CALC: 154 mg/dL — AB (ref 0–99)
NONHDL: 176.31
Total CHOL/HDL Ratio: 3
Triglycerides: 113 mg/dL (ref 0.0–149.0)
VLDL: 22.6 mg/dL (ref 0.0–40.0)

## 2017-05-14 LAB — POCT GLYCOSYLATED HEMOGLOBIN (HGB A1C): Hemoglobin A1C: 10.3

## 2017-05-14 MED ORDER — INSULIN GLARGINE 100 UNIT/ML SOLOSTAR PEN: PEN_INJECTOR | SUBCUTANEOUS | 0 refills | Status: DC

## 2017-05-14 MED ORDER — DICLOFENAC SODIUM 75 MG PO TBEC: 75.0000 mg | DELAYED_RELEASE_TABLET | Freq: Two times a day (BID) | ORAL | 0 refills | Status: DC

## 2017-05-14 MED ORDER — SILDENAFIL CITRATE 20 MG PO TABS: 20.0000 mg | ORAL_TABLET | Freq: Every day | ORAL | 3 refills | Status: DC | PRN

## 2017-05-14 MED ORDER — METFORMIN HCL ER 500 MG PO TB24: 1000.0000 mg | ORAL_TABLET | Freq: Every evening | ORAL | 2 refills | Status: DC

## 2017-05-14 MED ORDER — INSULIN LISPRO 100 UNIT/ML (KWIKPEN): PEN_INJECTOR | SUBCUTANEOUS | 0 refills | Status: DC

## 2017-05-14 NOTE — Assessment & Plan Note (Signed)
May have some cervical radiculopathy symptoms playing a role as well.  Give another course of diclofenac today.  If continues to be problematic, would consider referral to sports medicine for further evaluation management.

## 2017-05-14 NOTE — Assessment & Plan Note (Signed)
A1c improved to 10.3, however still uncontrolled.  We will restart his metformin.  Continue Lantus 10 units twice daily and Humalog 7 to 12 units with meals.  He will follow-up with me in 3 months.  Will titrate dose of insulin as tolerated.  Foot exam performed today.  See flow sheet for details.  Will obtain records from his recent eye exam.

## 2017-05-14 NOTE — Assessment & Plan Note (Signed)
Associated with his diabetes.  Start sildenafil 20 to 100 mg daily as needed.  Discussed side effect of medication.

## 2017-05-14 NOTE — Assessment & Plan Note (Signed)
Check lipid panel today.  Will need to start statin pending results.

## 2017-05-14 NOTE — Patient Instructions (Signed)
Restart the metformin.  We will not make any other changes to yourinsulin at this time.  I would like to get records of your most recent eye exam.  Please start diclofenac for your arm numbness.  We may need to get you to see Dr. Paulla Fore if this does not improve.  We will check your cholesterol level today.  Please come back to see me in 3 months, or sooner as needed.  TakeCare, Dr. [name]

## 2017-05-14 NOTE — Progress Notes (Signed)
Subjective:  [name] is a 52 y.o. male who presents today with a chief complaint of T1DM.   HPI:  Diabetes mellitus, established problem, controlled Patient has not seen his endocrinologist since her last visit.  Reports that he has been discharged from the practice.  Currently is taking Lantus 10 units in the morning and 10 units at night.  He is also on Humalog 7 to 12 units with meals.  He has been off metformin for the last couple weeks and has had high blood sugars into the 400s.  While he was on metformin, fasting sugars typically in the 70-1 30 range.  Hyperlipidemia, established problem, stable Patient has been on Crestor in the past however was not able to tolerate that.  Would like to have a lipid panel checked today.  Erectile dysfunction, new problem Several year history.  He is concerned it may be associated with his diabetes.  Interested in starting medication for this.  Right arm numbness, established problem, stable Patient seen about 3 to 4 months ago for this.  Diagnosed with carpal tunnel syndrome.  Patient was started on diclofenac and placed in a cock-up wrist splint.  This significantly helped with his right wrist and hand numbness.  Recently has had worsening numbness in his right shoulder and neck.  No treatment tried for this.  ROS: Per HPI  PMH: He reports that he has been smoking cigarettes.  He has been smoking about 1.00 pack per day. He has never used smokeless tobacco. He reports that he drinks about 4.8 oz of alcohol per week. He reports that he does not use drugs.   Objective:  Physical Exam: BP 128/80 (BP Location: Left Arm)   Pulse 74   Temp 97.8 F (36.6 C) (Oral)   Resp 14   Wt 176 lb (79.8 kg)   SpO2 99%   BMI 27.57 kg/m   Gen: NAD, resting comfortably CV: RRR with no murmurs appreciated Pulm: NWOB, CTAB with no crackles, wheezes, or rhonchi MSK:  -Neck: No deformities.  Full range of motion.  Spurling negative bilaterally. -Right  shoulder: No deformities.  Full range of motion.  Neer and Hawking test negative. -Bilateral feet: No deformities.  PT and DP pulses 2+ bilaterally.  Monofilament testing intact bilaterally. Skin: Warm, dry Neuro: Grossly normal, moves all extremities Psych: Normal affect and thought content  Results for orders placed or performed in visit on 05/14/17 (from the past 24 hour(s))  POCT glycosylated hemoglobin (Hb A1C)     Status: None   Collection Time: 05/14/17  8:50 AM  Result Value Ref Range   Hemoglobin A1C 10.3    Assessment/Plan:  Diabetes mellitus type 1 (HCC) A1c improved to 10.3, however still uncontrolled.  We will restart his metformin.  Continue Lantus 10 units twice daily and Humalog 7 to 12 units with meals.  He will follow-up with me in 3 months.  Will titrate dose of insulin as tolerated.  Foot exam performed today.  See flow sheet for details.  Will obtain records from his recent eye exam.  Carpal tunnel syndrome May have some cervical radiculopathy symptoms playing a role as well.  Give another course of diclofenac today.  If continues to be problematic, would consider referral to sports medicine for further evaluation management.  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  Will need to start statin pending results.  Erectile dysfunction Associated with his diabetes.  Start sildenafil 20 to 100  mg daily as needed.  Discussed side effect of medication.  [name]. [name], MD 05/14/2017 9:53 AM

## 2017-05-15 ENCOUNTER — Other Ambulatory Visit: Payer: Self-pay

## 2017-05-15 MED ORDER — ATORVASTATIN CALCIUM 40 MG PO TABS: 40.0000 mg | ORAL_TABLET | Freq: Every day | ORAL | 0 refills | Status: DC

## 2017-05-17 ENCOUNTER — Other Ambulatory Visit: Payer: Self-pay | Admitting: Internal Medicine

## 2017-06-15 ENCOUNTER — Other Ambulatory Visit: Payer: Self-pay | Admitting: Internal Medicine

## 2017-07-16 ENCOUNTER — Other Ambulatory Visit: Payer: Self-pay | Admitting: Family Medicine

## 2017-08-03 ENCOUNTER — Other Ambulatory Visit: Payer: Self-pay | Admitting: Internal Medicine

## 2017-08-08 ENCOUNTER — Other Ambulatory Visit: Payer: Self-pay | Admitting: Family Medicine

## 2017-08-13 ENCOUNTER — Ambulatory Visit: Payer: Commercial Managed Care - PPO | Admitting: Family Medicine

## 2017-08-15 ENCOUNTER — Ambulatory Visit: Payer: Commercial Managed Care - PPO | Admitting: Family Medicine

## 2017-08-15 DIAGNOSIS — Z0289 Encounter for other administrative examinations: Secondary | ICD-10-CM

## 2017-08-16 ENCOUNTER — Encounter: Payer: Self-pay | Admitting: Family Medicine

## 2017-08-16 ENCOUNTER — Other Ambulatory Visit: Payer: Self-pay | Admitting: Family Medicine

## 2017-08-24 ENCOUNTER — Other Ambulatory Visit: Payer: Self-pay

## 2017-08-24 ENCOUNTER — Telehealth: Payer: Self-pay | Admitting: Family Medicine

## 2017-08-24 MED ORDER — ESOMEPRAZOLE MAGNESIUM 40 MG PO CPDR: DELAYED_RELEASE_CAPSULE | ORAL | 0 refills | Status: DC

## 2017-08-24 MED ORDER — INSULIN LISPRO 100 UNIT/ML (KWIKPEN): PEN_INJECTOR | SUBCUTANEOUS | 0 refills | Status: DC

## 2017-08-24 NOTE — Telephone Encounter (Signed)
Copied from [place] [id number]. Topic: Quick Communication - Rx Refill/Question >> Aug 24, 2017 12:04 PM Scherrie Gerlach wrote: Medication: insulin lispro (HUMALOG KWIKPEN) 100 UNIT/ML KiwkPen esomeprazole (NEXIUM) 40 MG capsule Pt is completely out of his insulin, and waiting to reschedule his last missed appt  [facility] [place] - [place] [phone] (Phone) [phone] (Fax)

## 2017-08-24 NOTE — Telephone Encounter (Signed)
Refill has been sent to pharmacy.  

## 2017-08-25 ENCOUNTER — Other Ambulatory Visit: Payer: Self-pay | Admitting: Family Medicine

## 2017-08-31 ENCOUNTER — Encounter: Payer: Self-pay | Admitting: Family Medicine

## 2017-08-31 ENCOUNTER — Ambulatory Visit (INDEPENDENT_AMBULATORY_CARE_PROVIDER_SITE_OTHER): Payer: Commercial Managed Care - PPO | Admitting: Family Medicine

## 2017-08-31 VITALS — BP 134/78 | HR 86 | Temp 98.5°F | Ht 67.0 in | Wt 182.6 lb

## 2017-08-31 DIAGNOSIS — E1065 Type 1 diabetes mellitus with hyperglycemia: Secondary | ICD-10-CM | POA: Diagnosis not present

## 2017-08-31 DIAGNOSIS — E1069 Type 1 diabetes mellitus with other specified complication: Secondary | ICD-10-CM

## 2017-08-31 DIAGNOSIS — N529 Male erectile dysfunction, unspecified: Secondary | ICD-10-CM | POA: Diagnosis not present

## 2017-08-31 DIAGNOSIS — E1159 Type 2 diabetes mellitus with other circulatory complications: Secondary | ICD-10-CM

## 2017-08-31 DIAGNOSIS — E785 Hyperlipidemia, unspecified: Secondary | ICD-10-CM

## 2017-08-31 DIAGNOSIS — I10 Essential (primary) hypertension: Secondary | ICD-10-CM

## 2017-08-31 DIAGNOSIS — R600 Localized edema: Secondary | ICD-10-CM

## 2017-08-31 LAB — POCT GLYCOSYLATED HEMOGLOBIN (HGB A1C): Hemoglobin A1C: 10 % — AB (ref 4.0–5.6)

## 2017-08-31 MED ORDER — IRBESARTAN 300 MG PO TABS: 300.0000 mg | ORAL_TABLET | Freq: Every day | ORAL | 3 refills | Status: DC

## 2017-08-31 MED ORDER — ESOMEPRAZOLE MAGNESIUM 40 MG PO CPDR: DELAYED_RELEASE_CAPSULE | ORAL | 3 refills | Status: DC

## 2017-08-31 MED ORDER — FREESTYLE LIBRE SENSOR SYSTEM MISC: 0 refills | Status: DC

## 2017-08-31 MED ORDER — METFORMIN HCL ER 500 MG PO TB24: 1000.0000 mg | ORAL_TABLET | Freq: Every evening | ORAL | 11 refills | Status: DC

## 2017-08-31 MED ORDER — ATORVASTATIN CALCIUM 40 MG PO TABS: 40.0000 mg | ORAL_TABLET | Freq: Every day | ORAL | 3 refills | Status: DC

## 2017-08-31 MED ORDER — INSULIN LISPRO 100 UNIT/ML (KWIKPEN): PEN_INJECTOR | SUBCUTANEOUS | 5 refills | Status: DC

## 2017-08-31 MED ORDER — INSULIN GLARGINE 100 UNIT/ML SOLOSTAR PEN: PEN_INJECTOR | SUBCUTANEOUS | 5 refills | Status: DC

## 2017-08-31 NOTE — Assessment & Plan Note (Signed)
-  Continue Lipitor 40 mg daily ?

## 2017-08-31 NOTE — Assessment & Plan Note (Signed)
At goal.  Continue irbesartan 300 mg daily. 

## 2017-08-31 NOTE — Assessment & Plan Note (Signed)
Likely secondary to venous insufficiency.  No red flag signs or symptoms.  Recommended elevation, leg compression, and low-salt diet.

## 2017-08-31 NOTE — Patient Instructions (Signed)
It was very nice to see you today!  No medication changes today.  I will refill your meds today.  I will send in a new order for your glucometer.   Come back in 3 months, or sooner as needed.  Take care, Dr [name]

## 2017-08-31 NOTE — Assessment & Plan Note (Signed)
Improved with sildenafil.  Will continue.

## 2017-08-31 NOTE — Assessment & Plan Note (Signed)
A1c stable, however well above goal.  Discussed options with patient.  Given that he was off his medications for several weeks, it is possible this could have significantly elevated his A1c.  His reported glucoses while on medications are within a desirable range.  We will continue Lantus 10 units twice daily and Humalog 712 units 3 times a day with meals.  We will also continue metformin 1000 mg daily.  He will follow-up with me in 3 months and we will recheck A1c at that time.

## 2017-08-31 NOTE — Progress Notes (Signed)
Subjective:  [name] is a 52 y.o. male who presents today with a chief complaint of T1DM.   HPI:  T1DM, chronic problem, stable Patient last seen about 3 months ago for this.  Current regimen includes Lantus 10 units twice daily and Humalog 7 to 12 units with meals 3 times daily.  Sugars have typically been running in the low 100s to upper 100s.  Unfortunately, patient ran out of medications for a couple weeks and had a very high sugars.  He was able to restart his medications and since then, his sugars have been reportedly within his normal range.  He is tolerating all his medications currently without side effects.  Hypertension, chronic problem, stable Currently on irbesartan 300 mg daily.  Tolerating well without side effects.  Hyperlipidemia, chronic problem, stable  Currently on atorvastatin 40 mg daily and tolerating well without side effects.  Erectile dysfunction, chronic problem, improving Patient seen for this at his last office visit.  We started sildenafil as needed.  He has tried this a couple of times and reports good efficacy.  No reported side effects.  Lower extremity edema, new problem Several month history.  Located in both legs bilaterally.  Worsened today.  No reported chest pain or shortness of breath.  No reported orthopnea.  ROS: Per HPI  PMH: He reports that he has been smoking cigarettes. He has been smoking about 1.00 pack per day. He has never used smokeless tobacco. He reports that he drinks about 8.0 standard drinks of alcohol per week. He reports that he does not use drugs.  Objective:  Physical Exam: BP 134/78 (BP Location: Left Arm, Patient Position: Sitting, Cuff Size: Normal)   Pulse 86   Temp 98.5 F (36.9 C) (Oral)   Ht 5\' 7"  (1.702 m)   Wt 182 lb 9.6 oz (82.8 kg)   SpO2 97%   BMI 28.60 kg/m   Wt Readings from Last 3 Encounters:  08/31/17 182 lb 9.6 oz (82.8 kg)  05/14/17 176 lb (79.8 kg)  01/31/17 178 lb 9.6 oz (81 kg)  Gen: NAD,  resting comfortably CV: RRR with no murmurs appreciated Pulm: NWOB, CTAB with no crackles, wheezes, or rhonchi Results for orders placed or performed in visit on 08/31/17 (from the past 24 hour(s))  POCT glycosylated hemoglobin (Hb A1C)     Status: Abnormal   Collection Time: 08/31/17  4:32 PM  Result Value Ref Range   Hemoglobin A1C 10.0 (A) 4.0 - 5.6 %   HbA1c POC (<> result, manual entry)     HbA1c, POC (prediabetic range)     HbA1c, POC (controlled diabetic range)       Assessment/Plan:  Diabetes mellitus type 1 (HCC) A1c stable, however well above goal.  Discussed options with patient.  Given that he was off his medications for several weeks, it is possible this could have significantly elevated his A1c.  His reported glucoses while on medications are within a desirable range.  We will continue Lantus 10 units twice daily and Humalog 712 units 3 times a day with meals.  We will also continue metformin 1000 mg daily.  He will follow-up with me in 3 months and we will recheck A1c at that time.  Leg edema Likely secondary to venous insufficiency.  No red flag signs or symptoms.  Recommended elevation, leg compression, and low-salt diet.  Hypertension associated with diabetes ([place]) At goal.  Continue irbesartan 300 mg daily.  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  Erectile dysfunction Improved with sildenafil.  Will continue.   [name]. [name], MD 08/31/2017 4:58 PM

## 2017-09-24 ENCOUNTER — Encounter: Payer: Self-pay | Admitting: Family Medicine

## 2017-10-06 ENCOUNTER — Other Ambulatory Visit: Payer: Self-pay | Admitting: Family Medicine

## 2017-10-08 ENCOUNTER — Telehealth: Payer: Self-pay

## 2017-10-08 NOTE — Telephone Encounter (Signed)
I would advise he needs to be seen today- possibly with Sam today?

## 2017-10-08 NOTE — Telephone Encounter (Signed)
Spoke with patient.  He reports having some swelling in his lower legs and ankles when he is on his feet, soreness in his leg muscles when this happens.  He is also c/o SOB on exertion (+ smoker) denies any SOB at rest and edema in legs and ankles improves when he is sitting/lying down with his legs elevated.  He also states that when he is SOB, he does experience pain when taking a deep breath.  He does admit to some epigastric discomfort when these symptoms occur.  He denies any chest pain.  Dr. [name] recently discontinued his Lipitor, he was having some upper arm and neck soreness initially but this has stopped since discontinuing his Lipitor.  He is staying well hydrated and drinking plenty fluids.

## 2017-10-08 NOTE — Telephone Encounter (Signed)
Called and spoke with patient and advised per Inda Coke, P.A. To go to ED or Urgent Care for evaluation.  No appointments available on Samantha's schedule.  He will go to Urgent Care today to be seen.  Advised to keep appt tomorrow 9/24 also just in case needs to follow up in am.  Pt verbalized understanding of the above and agreed with plan.

## 2017-10-08 NOTE — Telephone Encounter (Signed)
Pt also states he has been experiencing a decrease in his energy level and feeling very tired/fatigued more often.

## 2017-10-09 ENCOUNTER — Encounter (HOSPITAL_COMMUNITY): Payer: Self-pay | Admitting: Emergency Medicine

## 2017-10-09 ENCOUNTER — Other Ambulatory Visit: Payer: Self-pay

## 2017-10-09 ENCOUNTER — Emergency Department (HOSPITAL_BASED_OUTPATIENT_CLINIC_OR_DEPARTMENT_OTHER): Payer: Commercial Managed Care - PPO

## 2017-10-09 ENCOUNTER — Ambulatory Visit: Payer: Commercial Managed Care - PPO | Admitting: Family Medicine

## 2017-10-09 ENCOUNTER — Observation Stay (HOSPITAL_COMMUNITY)
Admission: EM | Admit: 2017-10-09 | Discharge: 2017-10-10 | Disposition: A | Discharge status: Home / Self Care | Payer: Commercial Managed Care - PPO | Attending: Internal Medicine | Admitting: Internal Medicine

## 2017-10-09 ENCOUNTER — Emergency Department (HOSPITAL_COMMUNITY): Payer: Commercial Managed Care - PPO

## 2017-10-09 ENCOUNTER — Encounter: Payer: Self-pay | Admitting: Family Medicine

## 2017-10-09 ENCOUNTER — Ambulatory Visit (INDEPENDENT_AMBULATORY_CARE_PROVIDER_SITE_OTHER): Payer: Commercial Managed Care - PPO | Admitting: Family Medicine

## 2017-10-09 VITALS — BP 148/84 | HR 72 | Temp 98.4°F | Ht 67.0 in | Wt 191.0 lb

## 2017-10-09 DIAGNOSIS — R0609 Other forms of dyspnea: Secondary | ICD-10-CM | POA: Diagnosis not present

## 2017-10-09 DIAGNOSIS — E1069 Type 1 diabetes mellitus with other specified complication: Secondary | ICD-10-CM | POA: Diagnosis not present

## 2017-10-09 DIAGNOSIS — R600 Localized edema: Secondary | ICD-10-CM | POA: Diagnosis not present

## 2017-10-09 DIAGNOSIS — M79604 Pain in right leg: Secondary | ICD-10-CM

## 2017-10-09 DIAGNOSIS — N529 Male erectile dysfunction, unspecified: Secondary | ICD-10-CM | POA: Insufficient documentation

## 2017-10-09 DIAGNOSIS — D500 Iron deficiency anemia secondary to blood loss (chronic): Secondary | ICD-10-CM

## 2017-10-09 DIAGNOSIS — M7989 Other specified soft tissue disorders: Secondary | ICD-10-CM | POA: Diagnosis not present

## 2017-10-09 DIAGNOSIS — E1159 Type 2 diabetes mellitus with other circulatory complications: Secondary | ICD-10-CM | POA: Diagnosis present

## 2017-10-09 DIAGNOSIS — I10 Essential (primary) hypertension: Secondary | ICD-10-CM | POA: Insufficient documentation

## 2017-10-09 DIAGNOSIS — Z7982 Long term (current) use of aspirin: Secondary | ICD-10-CM | POA: Insufficient documentation

## 2017-10-09 DIAGNOSIS — Z791 Long term (current) use of non-steroidal anti-inflammatories (NSAID): Secondary | ICD-10-CM | POA: Diagnosis not present

## 2017-10-09 DIAGNOSIS — Z794 Long term (current) use of insulin: Secondary | ICD-10-CM | POA: Insufficient documentation

## 2017-10-09 DIAGNOSIS — I152 Hypertension secondary to endocrine disorders: Secondary | ICD-10-CM | POA: Diagnosis present

## 2017-10-09 DIAGNOSIS — E8770 Fluid overload, unspecified: Secondary | ICD-10-CM

## 2017-10-09 DIAGNOSIS — D649 Anemia, unspecified: Secondary | ICD-10-CM | POA: Diagnosis present

## 2017-10-09 DIAGNOSIS — E1065 Type 1 diabetes mellitus with hyperglycemia: Secondary | ICD-10-CM | POA: Diagnosis present

## 2017-10-09 DIAGNOSIS — E785 Hyperlipidemia, unspecified: Secondary | ICD-10-CM | POA: Diagnosis not present

## 2017-10-09 DIAGNOSIS — F17210 Nicotine dependence, cigarettes, uncomplicated: Secondary | ICD-10-CM | POA: Diagnosis not present

## 2017-10-09 DIAGNOSIS — M79605 Pain in left leg: Secondary | ICD-10-CM

## 2017-10-09 DIAGNOSIS — Z79899 Other long term (current) drug therapy: Secondary | ICD-10-CM | POA: Insufficient documentation

## 2017-10-09 DIAGNOSIS — E109 Type 1 diabetes mellitus without complications: Secondary | ICD-10-CM | POA: Diagnosis present

## 2017-10-09 DIAGNOSIS — R609 Edema, unspecified: Secondary | ICD-10-CM | POA: Diagnosis not present

## 2017-10-09 LAB — COMPREHENSIVE METABOLIC PANEL
ALBUMIN: 3.5 g/dL (ref 3.5–5.0)
ALK PHOS: 158 U/L — AB (ref 39–117)
ALK PHOS: 160 U/L — AB (ref 38–126)
ALT: 17 U/L (ref 0–53)
ALT: 21 U/L (ref 0–44)
ANION GAP: 11 (ref 5–15)
AST: 21 U/L (ref 0–37)
AST: 28 U/L (ref 15–41)
Albumin: 3.8 g/dL (ref 3.5–5.2)
BILIRUBIN TOTAL: 0.4 mg/dL (ref 0.3–1.2)
BUN: 18 mg/dL (ref 6–23)
BUN: 12 mg/dL (ref 6–20)
CALCIUM: 8.8 mg/dL — AB (ref 8.9–10.3)
CO2: 25 mEq/L (ref 19–32)
CO2: 21 mmol/L — AB (ref 22–32)
Calcium: 8.8 mg/dL (ref 8.4–10.5)
Chloride: 107 mEq/L (ref 96–112)
Chloride: 108 mmol/L (ref 98–111)
Creatinine, Ser: 0.91 mg/dL (ref 0.40–1.50)
Creatinine, Ser: 1.01 mg/dL (ref 0.61–1.24)
GFR calc non Af Amer: >60 mL/min (ref >60)
GFR: 93.18 mL/min (ref >60.00)
GLUCOSE: 249 mg/dL — AB (ref 70–99)
Glucose, Bld: 196 mg/dL — ABNORMAL HIGH (ref 70–99)
POTASSIUM: 4.3 meq/L (ref 3.5–5.1)
POTASSIUM: 4.3 mmol/L (ref 3.5–5.1)
SODIUM: 140 mmol/L (ref 135–145)
Sodium: 138 mEq/L (ref 135–145)
TOTAL PROTEIN: 6.7 g/dL (ref 6.0–8.3)
TOTAL PROTEIN: 6.6 g/dL (ref 6.5–8.1)
Total Bilirubin: 0.2 mg/dL (ref 0.2–1.2)

## 2017-10-09 LAB — CBC
HEMATOCRIT: 18.8 % — AB (ref 39.0–52.0)
HEMATOCRIT: 20.4 % — AB (ref 39.0–52.0)
HEMOGLOBIN: 5.4 g/dL — AB (ref 13.0–17.0)
HEMOGLOBIN: 5.3 g/dL — AB (ref 13.0–17.0)
MCH: 17.3 pg — ABNORMAL LOW (ref 26.0–34.0)
MCHC: 29 g/dL — AB (ref 30.0–36.0)
MCHC: 26 g/dL — AB (ref 30.0–36.0)
MCV: 60.2 fl — AB (ref 78.0–100.0)
MCV: 66.4 fL — ABNORMAL LOW (ref 78.0–100.0)
Platelets: 325 10*3/uL (ref 150.0–400.0)
Platelets: 347 10*3/uL (ref 150–400)
RBC: 3.07 Mil/uL — ABNORMAL LOW (ref 4.22–5.81)
RBC: 3.07 MIL/uL — AB (ref 4.22–5.81)
RDW: 18.1 % — ABNORMAL HIGH (ref 11.5–15.5)
RDW: 17.8 % — ABNORMAL HIGH (ref 11.5–15.5)
WBC: 6.2 10*3/uL (ref 4.0–10.5)
WBC: 5.6 10*3/uL (ref 4.0–10.5)

## 2017-10-09 LAB — PREPARE RBC (CROSSMATCH)

## 2017-10-09 LAB — POC OCCULT BLOOD, ED: FECAL OCCULT BLD: NEGATIVE

## 2017-10-09 LAB — D-DIMER, QUANTITATIVE: D-Dimer, Quant: 0.65 mcg/mL FEU — ABNORMAL HIGH (ref <0.50)

## 2017-10-09 LAB — GLUCOSE, CAPILLARY: Glucose-Capillary: 119 mg/dL — ABNORMAL HIGH (ref 70–99)

## 2017-10-09 LAB — ABO/RH: ABO/RH(D): A POS

## 2017-10-09 MED ORDER — ACETAMINOPHEN 650 MG RE SUPP: 650.0000 mg | Freq: Four times a day (QID) | RECTAL | Status: DC | PRN

## 2017-10-09 MED ORDER — IRBESARTAN 150 MG PO TABS
150.0000 mg | ORAL_TABLET | Freq: Every day | ORAL | Status: DC
  Administered 2017-10-10: 150 mg via ORAL
  Filled 2017-10-09 (×2): qty 1

## 2017-10-09 MED ORDER — IOPAMIDOL (ISOVUE-370) INJECTION 76%
100.0000 mL | Freq: Once | INTRAVENOUS | Status: AC | PRN
  Administered 2017-10-09: 100 mL via INTRAVENOUS

## 2017-10-09 MED ORDER — HYDROCODONE-ACETAMINOPHEN 5-325 MG PO TABS: 1.0000 | ORAL_TABLET | ORAL | Status: DC | PRN

## 2017-10-09 MED ORDER — SODIUM CHLORIDE 0.9% IV SOLUTION: Freq: Once | INTRAVENOUS | Status: DC

## 2017-10-09 MED ORDER — IOPAMIDOL (ISOVUE-370) INJECTION 76%
INTRAVENOUS | Status: AC
  Filled 2017-10-09: qty 100

## 2017-10-09 MED ORDER — INSULIN GLARGINE 100 UNIT/ML ~~LOC~~ SOLN
7.0000 [IU] | Freq: Two times a day (BID) | SUBCUTANEOUS | Status: DC
  Administered 2017-10-09 – 2017-10-10 (×2): 7 [IU] via SUBCUTANEOUS
  Filled 2017-10-09 (×3): qty 0.07

## 2017-10-09 MED ORDER — IRBESARTAN 300 MG PO TABS: 300.0000 mg | ORAL_TABLET | Freq: Every day | ORAL | Status: DC

## 2017-10-09 MED ORDER — SODIUM CHLORIDE 0.9% FLUSH
3.0000 mL | Freq: Two times a day (BID) | INTRAVENOUS | Status: DC
  Administered 2017-10-09 – 2017-10-10 (×2): 3 mL via INTRAVENOUS

## 2017-10-09 MED ORDER — PANTOPRAZOLE SODIUM 40 MG PO TBEC
40.0000 mg | DELAYED_RELEASE_TABLET | Freq: Every day | ORAL | Status: DC
  Administered 2017-10-10: 40 mg via ORAL
  Filled 2017-10-09: qty 1

## 2017-10-09 MED ORDER — ONDANSETRON HCL 4 MG/2ML IJ SOLN: 4.0000 mg | Freq: Four times a day (QID) | INTRAMUSCULAR | Status: DC | PRN

## 2017-10-09 MED ORDER — SODIUM CHLORIDE 0.9% FLUSH: 3.0000 mL | INTRAVENOUS | Status: DC | PRN

## 2017-10-09 MED ORDER — INSULIN ASPART 100 UNIT/ML ~~LOC~~ SOLN: 0.0000 [IU] | Freq: Every day | SUBCUTANEOUS | Status: DC

## 2017-10-09 MED ORDER — INSULIN ASPART 100 UNIT/ML ~~LOC~~ SOLN
3.0000 [IU] | Freq: Three times a day (TID) | SUBCUTANEOUS | Status: DC
  Administered 2017-10-10 (×3): 3 [IU] via SUBCUTANEOUS
  Filled 2017-10-09: qty 1

## 2017-10-09 MED ORDER — ONDANSETRON HCL 4 MG PO TABS: 4.0000 mg | ORAL_TABLET | Freq: Four times a day (QID) | ORAL | Status: DC | PRN

## 2017-10-09 MED ORDER — ACETAMINOPHEN 325 MG PO TABS
650.0000 mg | ORAL_TABLET | Freq: Four times a day (QID) | ORAL | Status: DC | PRN
  Administered 2017-10-09 – 2017-10-10 (×2): 650 mg via ORAL
  Filled 2017-10-09 (×2): qty 2

## 2017-10-09 MED ORDER — INSULIN ASPART 100 UNIT/ML ~~LOC~~ SOLN
0.0000 [IU] | Freq: Three times a day (TID) | SUBCUTANEOUS | Status: DC
  Administered 2017-10-10: 5 [IU] via SUBCUTANEOUS
  Administered 2017-10-10 (×2): 8 [IU] via SUBCUTANEOUS
  Filled 2017-10-09 (×2): qty 1

## 2017-10-09 MED ORDER — SENNOSIDES-DOCUSATE SODIUM 8.6-50 MG PO TABS: 1.0000 | ORAL_TABLET | Freq: Every evening | ORAL | Status: DC | PRN

## 2017-10-09 MED ORDER — SODIUM CHLORIDE 0.9 % IV SOLN: 250.0000 mL | INTRAVENOUS | Status: DC | PRN

## 2017-10-09 NOTE — Progress Notes (Signed)
Bilateral lower extremity venous duplex completed - Preliminary results - There is no evidence of DVT or Baker's cyst [facility], [facility] 10/09/2017 7:18 pm

## 2017-10-09 NOTE — ED Notes (Signed)
Pt leaves triage and seen walking straight to valet with wife.

## 2017-10-09 NOTE — ED Notes (Signed)
Blood bank called - blood is ready for this pt.

## 2017-10-09 NOTE — ED Triage Notes (Signed)
Pt reports doctors office called and told him his hemoglobin was 5. Pt reports he has been having bilateral leg swelling and has been having weakness when active for the last two weeks. Ambulatory in triage. Denies pain. Denies dark stools.

## 2017-10-09 NOTE — ED Provider Notes (Addendum)
[facility] Provider Note   CSN: [id number] Arrival date & time: 10/09/17  1431     History   Chief Complaint Chief Complaint  Patient presents with  . Abnormal Lab  . Low Hemoglobin    HPI [name] is a 52 y.o. male.  HPI   52 year old male with PMH significant for T1 DM, GERD, HLD, HTN, IDA who presents at urging of his PCP due to low hemoglobin of 5.4.  Patient states for the last 1 month is a bilateral lower extremity swelling is progressively worsening.  Patient seen by PCP before he thought was secondary to being on his feet all day.  Patient with recent travel to [place] return 3 weeks ago with progressive worsening of his bilateral lower extremity swelling.  Patient said for last 2 weeks he has had dyspnea on exertion without chest pain.  Denies dizziness, nausea vomiting or diaphoresis.  Patient states that during episodes of dyspnea he has epigastric "discomfort" described as mild, nothing makes better, nothing makes it worse, feels similar to his prior episodes of gastritis.  Patient with recent negative EGD for ulcer and colonoscopy revealing only polyps, negative for cancer, one year ago.  Patient currently denies chest pain or shortness of breath at rest.  Denies melena or blood in stool.  Denies history of hemorrhoids.   Past Medical History:  Diagnosis Date  . Allergy   . Anemia   . Diabetes mellitus without complication ([place])   . GERD (gastroesophageal reflux disease)   . HLD (hyperlipidemia)   . Hypertension     Patient Active Problem List   Diagnosis Date Noted  . Leg swelling 10/09/2017  . Leg edema 08/31/2017  . Erectile dysfunction 05/14/2017  . Carpal tunnel syndrome 01/04/2017  . Hypertension associated with diabetes ([place]) 01/04/2017  . Iron deficiency anemia due to chronic blood loss 01/20/2016  . Diabetes mellitus type 1 ([place]) 01/18/2016  . Hyperlipidemia due to type 1 diabetes mellitus ([place])  01/18/2016  . Symptomatic anemia 01/18/2016  . Gastroesophageal reflux disease without esophagitis 01/18/2016    Past Surgical History:  Procedure Laterality Date  . ANKLE SURGERY Right   . UPPER GASTROINTESTINAL ENDOSCOPY    . VASECTOMY  1994        Home Medications    Prior to Admission medications   Medication Sig Start Date End Date Taking? Authorizing Provider  Aspirin-Salicylamide-Caffeine (BC HEADACHE POWDER PO) Take 1 packet by mouth 4 (four) times daily as needed (for headaches).    Yes [provider]  esomeprazole (NEXIUM) 40 MG capsule TAKE (1) CAPSULE BY MOUTH ONCE DAILY. 08/31/17  Yes Vivi Barrack, MD  ibuprofen (ADVIL,MOTRIN) 200 MG tablet Take 400 mg by mouth every 6 (six) hours as needed (for pain).    Yes [provider]  Insulin Glargine (LANTUS SOLOSTAR) 100 UNIT/ML Solostar Pen INJECT 10 UNITS S.Q. TWICE DAILY. Patient taking differently: Inject 10 Units into the skin See admin instructions. Inject 10 units into the skin before breakfast and 10 units after supper 08/31/17  Yes Vivi Barrack, MD  insulin lispro (HUMALOG KWIKPEN) 100 UNIT/ML KiwkPen INJECT 7-12 UNITS UNDER THE SKIN THREE TIMES DAILY WITH MEALS. Patient taking differently: Inject 7-12 Units into the skin 3 (three) times daily with meals.  08/31/17  Yes Vivi Barrack, MD  irbesartan (AVAPRO) 300 MG tablet Take 1 tablet (300 mg total) by mouth daily. Follow-up appt is due  must see provider for future  refills Patient taking differently: Take 150 mg by mouth daily.  08/31/17  Yes Vivi Barrack, MD  metFORMIN (GLUCOPHAGE-XR) 500 MG 24 hr tablet Take 2 tablets (1,000 mg total) by mouth every evening. 08/31/17  Yes Vivi Barrack, MD  sildenafil (REVATIO) 20 MG tablet Take 1-5 tablets (20-100 mg total) by mouth daily as needed (erectile dysfunction). 05/14/17  Yes Vivi Barrack, MD  B-D ULTRAFINE III SHORT PEN 31G X 8 MM MISC USE 4 TIMES DAILY. 10/08/17   Vivi Barrack, MD    Continuous Blood Gluc Sensor (FREESTYLE LIBRE SENSOR SYSTEM) MISC Use four times daily as needed to check blood sugar. 08/31/17   Vivi Barrack, MD  Insulin Pen Needle 32G X 4 MM MISC Use 4x a day 02/25/16   Philemon Kingdom, MD    Family History Family History  Problem Relation Age of Onset  . COPD Mother   . Diabetes Mother   . Early death Father 71       MI  . Heart attack Father   . Lymphoma Sister 22  . Hypertension Brother   . Diabetes Maternal Grandmother   . Stroke Neg Hx   . Alcohol abuse Neg Hx   . Drug abuse Neg Hx   . Hyperlipidemia Neg Hx   . Kidney disease Neg Hx   . Colon cancer Neg Hx   . Esophageal cancer Neg Hx   . Stomach cancer Neg Hx   . Rectal cancer Neg Hx     Social History Social History   Tobacco Use  . Smoking status: Current Every Day Smoker    Packs/day: 1.00    Types: Cigarettes  . Smokeless tobacco: Never Used  Substance Use Topics  . Alcohol use: Yes    Alcohol/week: 8.0 standard drinks    Types: 8 Cans of beer per week    Comment: occ  . Drug use: No     Allergies   Crestor [rosuvastatin calcium]; Iron; Lipitor [atorvastatin calcium]; and Statins   Review of Systems Review of Systems  Constitutional: Negative for chills and fever.  HENT: Negative for ear pain and sore throat.   Eyes: Negative for pain and visual disturbance.  Respiratory: Positive for shortness of breath. Negative for cough.   Cardiovascular: Positive for leg swelling. Negative for chest pain and palpitations.  Gastrointestinal: Negative for abdominal pain and vomiting.  Genitourinary: Negative for dysuria and hematuria.  Musculoskeletal: Negative for arthralgias and back pain.  Skin: Negative for color change and rash.  Neurological: Negative for seizures and syncope.  All other systems reviewed and are negative.    Physical Exam Updated Vital Signs BP 138/70   Pulse 78   Temp 98.9 F (37.2 C) (Oral)   Resp 17   Ht 5\' 10"  (1.778 m)   Wt 86.6 kg    SpO2 99%   BMI 27.41 kg/m   Physical Exam  Constitutional: He appears well-developed and well-nourished.  HENT:  Head: Normocephalic and atraumatic.  Eyes: Conjunctivae are normal.  Neck: Neck supple.  Cardiovascular: Normal rate and regular rhythm.  No murmur heard. Pulmonary/Chest: Effort normal and breath sounds normal. No respiratory distress.  Abdominal: Soft. There is no tenderness.  Musculoskeletal: He exhibits edema.  Neurological: He is alert.  Skin: Skin is warm and dry. Capillary refill takes less than 2 seconds. There is pallor.  Psychiatric: He has a normal mood and affect.  Nursing note and vitals reviewed.    ED Treatments / Results  Labs (all labs ordered are listed, but only abnormal results are displayed) Labs Reviewed  COMPREHENSIVE METABOLIC PANEL - Abnormal; Notable for the following components:      Result Value   CO2 21 (*)    Glucose, Bld 249 (*)    Calcium 8.8 (*)    Alkaline Phosphatase 160 (*)    All other components within normal limits  CBC - Abnormal; Notable for the following components:   RBC 3.07 (*)    Hemoglobin 5.3 (*)    HCT 20.4 (*)    MCV 66.4 (*)    MCH 17.3 (*)    MCHC 26.0 (*)    RDW 17.8 (*)    All other components within normal limits  GLUCOSE, CAPILLARY - Abnormal; Notable for the following components:   Glucose-Capillary 119 (*)    All other components within normal limits  BRAIN NATRIURETIC PEPTIDE  VITAMIN B12  FOLATE  IRON AND TIBC  FERRITIN  RETICULOCYTES  HIV ANTIBODY (ROUTINE TESTING W REFLEX)  BASIC METABOLIC PANEL  POC OCCULT BLOOD, ED  TYPE AND SCREEN  ABO/RH  PREPARE RBC (CROSSMATCH)    EKG None  Radiology Ct Angio Chest Pe W And/or Wo Contrast  Result Date: 10/09/2017 CLINICAL DATA:  Low hemoglobin with bilateral leg swelling and weakness for 2 weeks. Dyspnea. EXAM: CT ANGIOGRAPHY CHEST WITH CONTRAST TECHNIQUE: Multidetector CT imaging of the chest was performed using the standard protocol  during bolus administration of intravenous contrast. Multiplanar CT image reconstructions and MIPs were obtained to evaluate the vascular anatomy. CONTRAST:  166mL ISOVUE-370 IOPAMIDOL (ISOVUE-370) INJECTION 76% COMPARISON:  09/05/2002 chest radiograph FINDINGS: Cardiovascular: Limited by motion artifacts. The study is of quality for the evaluation of pulmonary embolism. There are no filling defects in the central, lobar and segmental pulmonary artery branches to suggest acute pulmonary embolism. Great vessels are normal in course and caliber. Normal heart size. No significant pericardial fluid/thickening. Mediastinum/Nodes: No discrete thyroid nodules. Unremarkable esophagus. No pathologically enlarged axillary, mediastinal or hilar lymph nodes. Subcentimeter short axis bilateral hilar lymph nodes. 11 mm short axis right lower paratracheal lymph node. Findings may be reactive in etiology. Lungs/Pleura: No pneumothorax. No pleural effusion. Centrilobular emphysema bilaterally with bibasilar dependent atelectasis. Upper abdomen: Unremarkable. Musculoskeletal:  No aggressive appearing focal osseous lesions. Review of the MIP images confirms the above findings. IMPRESSION: 1. Centrilobular emphysema. 2. No active pulmonary disease. 3. No acute pulmonary embolus. Emphysema (ICD10-J43.9). Electronically Signed   By: Ashley Royalty M.D.   On: 10/09/2017 19:44    Procedures Procedures (including critical care time)  Medications Ordered in ED Medications  0.9 %  sodium chloride infusion (Manually program via Guardrails IV Fluids) (has no administration in time range)  iopamidol (ISOVUE-370) 76 % injection (has no administration in time range)  pantoprazole (PROTONIX) EC tablet 40 mg (has no administration in time range)  sodium chloride flush (NS) 0.9 % injection 3 mL (3 mLs Intravenous Given 10/09/17 2135)  sodium chloride flush (NS) 0.9 % injection 3 mL (has no administration in time range)  0.9 %  sodium  chloride infusion (has no administration in time range)  acetaminophen (TYLENOL) tablet 650 mg (650 mg Oral Given 10/09/17 2305)    Or  acetaminophen (TYLENOL) suppository 650 mg ( Rectal See Alternative 10/09/17 2305)  HYDROcodone-acetaminophen (NORCO/VICODIN) 5-325 MG per tablet 1-2 tablet (has no administration in time range)  senna-docusate (Senokot-S) tablet 1 tablet (has no administration in time range)  ondansetron (ZOFRAN) tablet 4 mg (has no administration in  time range)    Or  ondansetron (ZOFRAN) injection 4 mg (has no administration in time range)  insulin glargine (LANTUS) injection 7 Units (7 Units Subcutaneous Given 10/09/17 2252)  insulin aspart (novoLOG) injection 0-5 Units (0 Units Subcutaneous Not Given 10/09/17 2125)  insulin aspart (novoLOG) injection 0-15 Units (has no administration in time range)  insulin aspart (novoLOG) injection 3 Units (has no administration in time range)  irbesartan (AVAPRO) tablet 150 mg (has no administration in time range)  iopamidol (ISOVUE-370) 76 % injection 100 mL (100 mLs Intravenous Contrast Given 10/09/17 1914)     Initial Impression / Assessment and Plan / ED Course  I have reviewed the triage vital signs and the nursing notes.  Pertinent labs & imaging results that were available during my care of the patient were reviewed by me and considered in my medical decision making (see chart for details).     52 year old male with PMH significant for T1DM, GERD, HLD, HTN, IDA who presents at urging of his PCP due to low hemoglobin of 5.4.  History as above.  Differential diagnosis includes symptomatically deficiency anemia, malignancy, bleeding ulcer, heart failure, and silent MI.  annot rule out DVT or PE at this time.  Patient hemodynamic is stable, no signs of acute distress on initial evaluation.  Physical exam benign except for peripheral edema.  Lungs clear to auscultation at the bases.  No evidence of fluid overload.  Outside labs  reviewed.  Patient with hemoglobin of 5.3.  MCV 66.  Picture consistent with untreated iron deficiency anemia.  Patient with normal glucose.  No anion gap.  Patient's primary care provider performed a d-dimer which was positive.  DRE negative for blood.  Hemoccult negative for blood.  Without active bleed, GI was not consulted.  EKG showed normal sinus rhythm, normal intervals, normal axis, no signs of ST elevation or ST depression.  Low clinical suspicion for ischemia.  Duplex ultrasound bilateral lower extremities negative for DVT.  CT PE study negative for PE.  Patient admitted to hospitalist for further evaluation and to receive blood products.  Patient consented for blood. Final Clinical Impressions(s) / ED Diagnoses   Final diagnoses:  None    ED Discharge Orders    None       Keenan Bachelor, MD 10/10/17 Ofilia Neas    Keenan Bachelor, MD 10/10/17 Lynnell Catalan    Isla Pence, MD 10/10/17 [phone]

## 2017-10-09 NOTE — Patient Instructions (Signed)
It was very nice to see you today!  We will check blood work today and call you once the results are available.  Depending on the results, we may need to do further testing.  Please go to the ED if your symptoms worsen.   Take care, Dr [name]

## 2017-10-09 NOTE — H&P (Signed)
History and Physical    [name] PXT:[id number] DOB: Oct 02, 1965 DOA: 10/09/2017  PCP: Vivi Barrack, MD   Patient coming from: Home   Chief Complaint: DOE, fatigue, bilateral leg swelling   HPI: [name] is a 52 y.o. male with medical history significant for insulin-dependent diabetes mellitus, hypertension, GERD, and iron deficiency anemia, now presenting to the emergency department for evaluation of dyspnea on exertion, fatigue, and bilateral lower extremity edema.  Patient reports that symptoms have developed insidiously over months, becoming more severe in recent weeks and now limiting his activity.  He was evaluated by his PCP for these complaints and found to have elevated d-dimer and low hemoglobin; he was directed to the ED for further evaluation.  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  He was started on iron supplements, but reports being unable to tolerate GI upset that resulted.  He has not noted any melena, hematochezia, or constipation.  Has intermittent epigastric discomfort and indigestion for which he takes a daily Nexium.  Patient also uses oral diclofenac twice daily, frequent Goody's powders, and a daily baby aspirin.  Denies any chest pain, fevers, or chills.  Denies vomiting or diarrhea.  ED Course: Upon arrival to the ED, patient is found to be afebrile, saturating well on room air, and with vitals otherwise normal.  EKG features normal sinus rhythm and bilateral lower extremity venous ultrasound is negative for DVT on preliminary read.  CTA chest is concerning for emphysematous changes, but no acute disease and no PE.  Chemistry panel is unremarkable and CBC notable for hemoglobin of 5.3 and MCV of 66.4.  Fecal occult blood testing is negative.  2 units of packed red blood cells were ordered for immediate transfusion from the ED.  Patient remains hemodynamically stable and will be observed in the  hospital for ongoing evaluation and management of symptomatic anemia, likely secondary to chronic GI blood loss.  Review of Systems:  All other systems reviewed and apart from HPI, are negative.  Past Medical History:  Diagnosis Date  . Allergy   . Anemia   . Diabetes mellitus without complication ([place])   . GERD (gastroesophageal reflux disease)   . HLD (hyperlipidemia)   . Hypertension     Past Surgical History:  Procedure Laterality Date  . ANKLE SURGERY Right   . UPPER GASTROINTESTINAL ENDOSCOPY    . VASECTOMY  1994     reports that he has been smoking cigarettes. He has been smoking about 1.00 pack per day. He has never used smokeless tobacco. He reports that he drinks about 8.0 standard drinks of alcohol per week. He reports that he does not use drugs.  Allergies  Allergen Reactions  . Crestor [Rosuvastatin Calcium] Other (See Comments)    Muscle aches  . Lipitor [Atorvastatin Calcium]     Muscles aches    Family History  Problem Relation Age of Onset  . COPD Mother   . Diabetes Mother   . Early death Father 69       MI  . Heart attack Father   . Lymphoma Sister 41  . Hypertension Brother   . Diabetes Maternal Grandmother   . Stroke Neg Hx   . Alcohol abuse Neg Hx   . Drug abuse Neg Hx   . Hyperlipidemia Neg Hx   . Kidney disease Neg Hx   . Colon cancer Neg Hx   . Esophageal cancer  Neg Hx   . Stomach cancer Neg Hx   . Rectal cancer Neg Hx      Prior to Admission medications   Medication Sig Start Date End Date Taking? Authorizing Provider  aspirin EC 81 MG tablet Take 1 tablet (81 mg total) by mouth daily. 01/18/16   Janith Lima, MD  atorvastatin (LIPITOR) 40 MG tablet Take 1 tablet (40 mg total) by mouth daily. Patient not taking: Reported on 10/09/2017 08/31/17   Vivi Barrack, MD  B-D ULTRAFINE III SHORT PEN 31G X 8 MM MISC USE 4 TIMES DAILY. 10/08/17   Vivi Barrack, MD  Continuous Blood Gluc Sensor (FREESTYLE LIBRE SENSOR SYSTEM) MISC Use four  times daily as needed to check blood sugar. 08/31/17   Vivi Barrack, MD  diclofenac (VOLTAREN) 75 MG EC tablet Take 1 tablet (75 mg total) by mouth 2 (two) times daily. 05/14/17   Vivi Barrack, MD  esomeprazole (NEXIUM) 40 MG capsule TAKE (1) CAPSULE BY MOUTH ONCE DAILY. 08/31/17   Vivi Barrack, MD  ferrous sulfate 325 (65 FE) MG EC tablet Take 1 tablet (325 mg total) by mouth 3 (three) times daily with meals. 04/11/16   Irene Shipper, MD  Insulin Glargine (LANTUS SOLOSTAR) 100 UNIT/ML Solostar Pen INJECT 10 UNITS S.Q. TWICE DAILY. 08/31/17   Vivi Barrack, MD  insulin lispro (HUMALOG KWIKPEN) 100 UNIT/ML KiwkPen INJECT 7-12 UNITS UNDER THE SKIN THREE TIMES DAILY WITH MEALS. 08/31/17   Vivi Barrack, MD  Insulin Pen Needle 32G X 4 MM MISC Use 4x a day 02/25/16   Philemon Kingdom, MD  irbesartan (AVAPRO) 300 MG tablet Take 1 tablet (300 mg total) by mouth daily. Follow-up appt is due  must see provider for future refills 08/31/17   Vivi Barrack, MD  metFORMIN (GLUCOPHAGE-XR) 500 MG 24 hr tablet Take 2 tablets (1,000 mg total) by mouth every evening. 08/31/17   Vivi Barrack, MD  sildenafil (REVATIO) 20 MG tablet Take 1-5 tablets (20-100 mg total) by mouth daily as needed (erectile dysfunction). 05/14/17   Vivi Barrack, MD    Physical Exam: Vitals:   10/09/17 1518 10/09/17 1526 10/09/17 1815  BP: 134/77  (!) 144/87  Pulse: 82  79  Resp: 16  (!) 21  Temp: 98.9 F (37.2 C)    TempSrc: Oral    SpO2: 98%  99%  Weight:  86.6 kg   Height:  5\' 10"  (1.778 m)      Constitutional: NAD, calm  Eyes: PERTLA, lids and conjunctivae normal ENMT: Mucous membranes are moist. Posterior pharynx clear of any exudate or lesions.   Neck: normal, supple, no masses, no thyromegaly Respiratory: clear to auscultation bilaterally, no wheezing, no crackles. Normal respiratory effort.   Cardiovascular: S1 & S2 heard, regular rate and rhythm. 2+ pretibial pitting edema bialterally. Abdomen: No distension,  no tenderness, soft. Bowel sounds normal.  Musculoskeletal: no clubbing / cyanosis. No joint deformity upper and lower extremities.    Skin: no significant rashes, lesions, ulcers. Warm, dry, well-perfused. Neurologic: CN 2-12 grossly intact. Sensation intact. Strength 5/5 in all 4 limbs.  Psychiatric: Alert and oriented x 3. Calm, cooperative.    Labs on Admission: I have personally reviewed following labs and imaging studies  CBC: Recent Labs  Lab 10/09/17 1010 10/09/17 1529  WBC 6.2 5.6  HGB 5.4* 5.3*  HCT 18.8* 20.4*  MCV 60.2* 66.4*  PLT 325.0 510   Basic Metabolic Panel: Recent Labs  Lab 10/09/17 1010 10/09/17 1529  NA 138 140  K 4.3 4.3  CL 107 108  CO2 25 21*  GLUCOSE 196* 249*  BUN 18 12  CREATININE 0.91 1.01  CALCIUM 8.8 8.8*   GFR: Estimated Creatinine Clearance: 89.3 mL/min (by C-G formula based on SCr of 1.01 mg/dL). Liver Function Tests: Recent Labs  Lab 10/09/17 1010 10/09/17 1529  AST 21 28  ALT 17 21  ALKPHOS 158* 160*  BILITOT 0.2 0.4  PROT 6.7 6.6  ALBUMIN 3.8 3.5   No results for input(s): LIPASE, AMYLASE in the last 168 hours. No results for input(s): AMMONIA in the last 168 hours. Coagulation Profile: No results for input(s): INR, PROTIME in the last 168 hours. Cardiac Enzymes: No results for input(s): CKTOTAL, CKMB, CKMBINDEX, TROPONINI in the last 168 hours. BNP (last 3 results) No results for input(s): PROBNP in the last 8760 hours. HbA1C: No results for input(s): HGBA1C in the last 72 hours. CBG: No results for input(s): GLUCAP in the last 168 hours. Lipid Profile: No results for input(s): CHOL, HDL, LDLCALC, TRIG, CHOLHDL, LDLDIRECT in the last 72 hours. Thyroid Function Tests: No results for input(s): TSH, T4TOTAL, FREET4, T3FREE, THYROIDAB in the last 72 hours. Anemia Panel: No results for input(s): VITAMINB12, FOLATE, FERRITIN, TIBC, IRON, RETICCTPCT in the last 72 hours. Urine analysis:    Component Value Date/Time    COLORURINE YELLOW 01/18/2016 1020   APPEARANCEUR CLEAR 01/18/2016 1020   LABSPEC 1.010 01/18/2016 1020   PHURINE 5.5 01/18/2016 1020   GLUCOSEU >=1000 (A) 01/18/2016 1020   HGBUR TRACE-LYSED (A) 01/18/2016 1020   BILIRUBINUR NEGATIVE 01/18/2016 1020   KETONESUR NEGATIVE 01/18/2016 1020   UROBILINOGEN 0.2 01/18/2016 1020   NITRITE NEGATIVE 01/18/2016 1020   LEUKOCYTESUR NEGATIVE 01/18/2016 1020   Sepsis Labs: @LABRCNTIP (procalcitonin:4,lacticidven:4) )No results found for this or any previous visit (from the past 240 hour(s)).   Radiological Exams on Admission: Ct Angio Chest Pe W And/or Wo Contrast  Result Date: 10/09/2017 CLINICAL DATA:  Low hemoglobin with bilateral leg swelling and weakness for 2 weeks. Dyspnea. EXAM: CT ANGIOGRAPHY CHEST WITH CONTRAST TECHNIQUE: Multidetector CT imaging of the chest was performed using the standard protocol during bolus administration of intravenous contrast. Multiplanar CT image reconstructions and MIPs were obtained to evaluate the vascular anatomy. CONTRAST:  198mL ISOVUE-370 IOPAMIDOL (ISOVUE-370) INJECTION 76% COMPARISON:  09/05/2002 chest radiograph FINDINGS: Cardiovascular: Limited by motion artifacts. The study is of quality for the evaluation of pulmonary embolism. There are no filling defects in the central, lobar and segmental pulmonary artery branches to suggest acute pulmonary embolism. Great vessels are normal in course and caliber. Normal heart size. No significant pericardial fluid/thickening. Mediastinum/Nodes: No discrete thyroid nodules. Unremarkable esophagus. No pathologically enlarged axillary, mediastinal or hilar lymph nodes. Subcentimeter short axis bilateral hilar lymph nodes. 11 mm short axis right lower paratracheal lymph node. Findings may be reactive in etiology. Lungs/Pleura: No pneumothorax. No pleural effusion. Centrilobular emphysema bilaterally with bibasilar dependent atelectasis. Upper abdomen: Unremarkable.  Musculoskeletal:  No aggressive appearing focal osseous lesions. Review of the MIP images confirms the above findings. IMPRESSION: 1. Centrilobular emphysema. 2. No active pulmonary disease. 3. No acute pulmonary embolus. Emphysema (ICD10-J43.9). Electronically Signed   By: Ashley Royalty M.D.   On: 10/09/2017 19:44    EKG: Independently reviewed. Normal sinus rhythm.   Assessment/Plan   1. 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 Insulin-dependent DM  - A1c was 10.0% in August  - Managed at home with Lantus 10 units BID, Humalog 7-15 TID, and metformin  - Check CBG's, start with Lantus with 7 units BID and Novolog 3 units with meals plus sliding-scale correctional while in hospital    3. Hypertension  - BP at goal  - Continue ARB   4. Bilateral leg edema  - Venous US with prelim read negative for DVT - Likely secondary to severe iron-deficiency    DVT prophylaxis: SCD's  Code Status: Full  Family Communication: Wife updated at bedside  Consults called: None Admission status: Observation     Vianne Bulls, MD [facility] Pager [phone]  If 7PM-7AM, please contact night-coverage [email or web address] Password [facility]  10/09/2017, 7:50 PM

## 2017-10-09 NOTE — Progress Notes (Signed)
Please inform patient of the following:  His D-Dimer is positive. We need to get a CTA to rule out PE. Please call patient and order STAT CTA chest for PE evaluation.  We are still waiting on his other tests to come back.  [name]. [name], MD 10/09/2017 1:26 PM

## 2017-10-09 NOTE — Progress Notes (Signed)
   Subjective:  [name] is a 52 y.o. male who presents today for same-day appointment with a chief complaint of fatigue.   HPI:  Fatigue, Acute problem Started 2-3 weeks ago.  Worsened over that time.  Patient took a vacation to [place] about a month ago and noticed that his symptoms started there.  He has had several associated symptoms including extreme shortness of breath on exertion and leg pain on exertion.  Also feels a pressure, tightness sensation in the bottom of his chest with exertion.  Occasionally feels dizziness with standing up and bending over.  He is also had some swelling in both of his legs.  Occasionally.  Symptoms, he stopped this which helped with some of his muscle aches however his other symptoms have persisted.  No syncopal episodes.  Symptoms are not present when he is sitting or standing still.  Symptoms are brought on by climbing stairs or modest physical exertion.  ROS: Per HPI  PMH: He reports that he has been smoking cigarettes. He has been smoking about 1.00 pack per day. He has never used smokeless tobacco. He reports that he drinks about 8.0 standard drinks of alcohol per week. He reports that he does not use drugs.  Objective:  Physical Exam: BP (!) 148/84 (BP Location: Left Arm, Patient Position: Sitting, Cuff Size: Normal)   Pulse 72   Temp 98.4 F (36.9 C) (Oral)   Ht 5\' 7"  (1.702 m)   Wt 191 lb (86.6 kg)   SpO2 98%   BMI 29.91 kg/m   Gen: NAD, resting comfortably CV: RRR with no murmurs appreciated Pulm: NWOB, CTAB with no crackles, wheezes, or rhonchi Extremities: 2+ pitting edema to knees bilaterally  EKG: NSR. No acute ischemic changes.   Assessment/Plan:  Dyspnea on exertion Unclear etiology.  Ambulatory pulse ox 97% today.  His EKG is normal without any signs of ischemic changes.  Differential at this point includes symptomatic anemia, PE/DVT, new onset heart failure.  Will check stat CBC, CMET, and d-dimer.  Also check iron panel  given his history of iron deficiency anemia.  May need CTA if d-dimer is positive.  If above work-up negative, would consider echocardiogram to rule out heart failure, and ABI to rule out lower extremity claudication.  Discussed strict reasons to go to the emergency room and return to care.  Time Spent: I spent >25 minutes face-to-face with the patient, with more than half spent on counseling for management plan for his fatigue, dyspnea on exertion, and lower extremity pain/swelling.   [name]. [name], MD 10/09/2017 10:22 AM

## 2017-10-09 NOTE — ED Notes (Signed)
Patient transported to CT 

## 2017-10-09 NOTE — ED Notes (Signed)
Patient consent signed and at bedside

## 2017-10-10 ENCOUNTER — Observation Stay (HOSPITAL_BASED_OUTPATIENT_CLINIC_OR_DEPARTMENT_OTHER): Payer: Commercial Managed Care - PPO

## 2017-10-10 ENCOUNTER — Observation Stay (HOSPITAL_COMMUNITY): Payer: Commercial Managed Care - PPO

## 2017-10-10 DIAGNOSIS — M7989 Other specified soft tissue disorders: Secondary | ICD-10-CM | POA: Diagnosis not present

## 2017-10-10 DIAGNOSIS — D649 Anemia, unspecified: Secondary | ICD-10-CM | POA: Diagnosis not present

## 2017-10-10 DIAGNOSIS — E785 Hyperlipidemia, unspecified: Secondary | ICD-10-CM | POA: Diagnosis not present

## 2017-10-10 DIAGNOSIS — E1065 Type 1 diabetes mellitus with hyperglycemia: Secondary | ICD-10-CM | POA: Diagnosis not present

## 2017-10-10 DIAGNOSIS — I10 Essential (primary) hypertension: Secondary | ICD-10-CM | POA: Diagnosis not present

## 2017-10-10 DIAGNOSIS — D500 Iron deficiency anemia secondary to blood loss (chronic): Secondary | ICD-10-CM | POA: Diagnosis not present

## 2017-10-10 DIAGNOSIS — I361 Nonrheumatic tricuspid (valve) insufficiency: Secondary | ICD-10-CM | POA: Diagnosis not present

## 2017-10-10 DIAGNOSIS — E1069 Type 1 diabetes mellitus with other specified complication: Secondary | ICD-10-CM | POA: Diagnosis not present

## 2017-10-10 LAB — BASIC METABOLIC PANEL
ANION GAP: 9 (ref 5–15)
BUN: 14 mg/dL (ref 6–20)
CO2: 23 mmol/L (ref 22–32)
Calcium: 8.6 mg/dL — ABNORMAL LOW (ref 8.9–10.3)
Chloride: 106 mmol/L (ref 98–111)
Creatinine, Ser: 0.86 mg/dL (ref 0.61–1.24)
GFR calc Af Amer: >60 mL/min (ref >60)
GLUCOSE: 211 mg/dL — AB (ref 70–99)
POTASSIUM: 3.8 mmol/L (ref 3.5–5.1)
Sodium: 138 mmol/L (ref 135–145)

## 2017-10-10 LAB — IRON,TIBC AND FERRITIN PANEL
%SAT: 1 % (calc) — ABNORMAL LOW (ref 20–48)
Ferritin: 4 ng/mL — ABNORMAL LOW (ref 38–380)
TIBC: 478 ug/dL — AB (ref 250–425)

## 2017-10-10 LAB — RETICULOCYTES
RBC.: 3.46 MIL/uL — ABNORMAL LOW (ref 4.22–5.81)
Retic Count, Absolute: 24.2 10*3/uL (ref 19.0–186.0)
Retic Ct Pct: 0.7 % (ref 0.4–3.1)

## 2017-10-10 LAB — CBC
HEMATOCRIT: 24.5 % — AB (ref 39.0–52.0)
Hemoglobin: 6.8 g/dL — CL (ref 13.0–17.0)
MCH: 19.2 pg — ABNORMAL LOW (ref 26.0–34.0)
MCHC: 27.8 g/dL — AB (ref 30.0–36.0)
MCV: 69 fL — AB (ref 78.0–100.0)
Platelets: 277 10*3/uL (ref 150–400)
RBC: 3.55 MIL/uL — ABNORMAL LOW (ref 4.22–5.81)
RDW: 20.7 % — AB (ref 11.5–15.5)
WBC: 6.5 10*3/uL (ref 4.0–10.5)

## 2017-10-10 LAB — GLUCOSE, CAPILLARY
GLUCOSE-CAPILLARY: 259 mg/dL — AB (ref 70–99)
GLUCOSE-CAPILLARY: 211 mg/dL — AB (ref 70–99)
Glucose-Capillary: 403 mg/dL — ABNORMAL HIGH (ref 70–99)
Glucose-Capillary: 262 mg/dL — ABNORMAL HIGH (ref 70–99)

## 2017-10-10 LAB — BRAIN NATRIURETIC PEPTIDE: B NATRIURETIC PEPTIDE 5: 129 pg/mL — AB (ref 0.0–100.0)

## 2017-10-10 LAB — ECHOCARDIOGRAM COMPLETE
Height: 70 in
Weight: 3056 oz

## 2017-10-10 LAB — IRON AND TIBC
IRON: 22 ug/dL — AB (ref 45–182)
Saturation Ratios: 5 % — ABNORMAL LOW (ref 17.9–39.5)
TIBC: 469 ug/dL — AB (ref 250–450)
UIBC: 447 ug/dL

## 2017-10-10 LAB — VITAMIN B12: VITAMIN B 12: 335 pg/mL (ref 180–914)

## 2017-10-10 LAB — HEMOGLOBIN AND HEMATOCRIT, BLOOD
HCT: 26.6 % — ABNORMAL LOW (ref 39.0–52.0)
Hemoglobin: 7.6 g/dL — ABNORMAL LOW (ref 13.0–17.0)

## 2017-10-10 LAB — FERRITIN: Ferritin: 4 ng/mL — ABNORMAL LOW (ref 24–336)

## 2017-10-10 LAB — FOLATE: Folate: 14 ng/mL (ref >5.9)

## 2017-10-10 LAB — HIV ANTIBODY (ROUTINE TESTING W REFLEX): HIV Screen 4th Generation wRfx: NONREACTIVE

## 2017-10-10 LAB — PREPARE RBC (CROSSMATCH)

## 2017-10-10 MED ORDER — FUROSEMIDE 10 MG/ML IJ SOLN
20.0000 mg | Freq: Once | INTRAMUSCULAR | Status: AC
  Administered 2017-10-10: 20 mg via INTRAVENOUS
  Filled 2017-10-10: qty 2

## 2017-10-10 MED ORDER — INSULIN ASPART 100 UNIT/ML ~~LOC~~ SOLN
12.0000 [IU] | Freq: Once | SUBCUTANEOUS | Status: AC
  Administered 2017-10-10: 12 [IU] via SUBCUTANEOUS
  Filled 2017-10-10: qty 1

## 2017-10-10 MED ORDER — IRBESARTAN 300 MG PO TABS: 150.0000 mg | ORAL_TABLET | Freq: Every day | ORAL | Status: DC

## 2017-10-10 MED ORDER — ALBUTEROL SULFATE (2.5 MG/3ML) 0.083% IN NEBU
2.5000 mg | INHALATION_SOLUTION | RESPIRATORY_TRACT | Status: DC | PRN
  Administered 2017-10-10: 2.5 mg via RESPIRATORY_TRACT
  Filled 2017-10-10: qty 3

## 2017-10-10 MED ORDER — SODIUM CHLORIDE 0.9% IV SOLUTION
Freq: Once | INTRAVENOUS | Status: AC
  Administered 2017-10-10: 13:00:00 via INTRAVENOUS

## 2017-10-10 MED ORDER — NICOTINE 14 MG/24HR TD PT24: 14.0000 mg | MEDICATED_PATCH | Freq: Every day | TRANSDERMAL | Status: DC

## 2017-10-10 MED ORDER — NICOTINE 14 MG/24HR TD PT24: 14.0000 mg | MEDICATED_PATCH | Freq: Every day | TRANSDERMAL | 0 refills | Status: DC

## 2017-10-10 NOTE — Progress Notes (Signed)
Blood transfusion paused. Pt completed 1 unit of PRBCs. When beginning second unit, pt complained of chest pressure and  SOB. Pt assessed, lungs sounds:  inspiratory and expiratory wheezing through upper left lung and lower bilateral lungs. PT temp. Remained 98.9 F. Vitals WDL. No rash seen throughout body. Pt urinated, urine yellow straw color. Provider paged, orders given and executed.

## 2017-10-10 NOTE — Progress Notes (Signed)
  Echocardiogram 2D Echocardiogram has been performed.  [name] 10/10/2017, 2:55 PM

## 2017-10-10 NOTE — Plan of Care (Signed)
Pt alert and oriented x4. Skin warm and dry. Respirations equal and unlabored. Gait steady. Pt able to remain free of injury. Pt verbalized understanding plan of care. Pt has adequate nutrition, ate 80% of dinner. Remaining free of complication from receiving blood transfusions. Pt explained risks and benefits. Verbalized understanding, consent obtained. Blood draws to be taken within scheduled allotted time from blood transfusion. Pt progressing towards discharge.

## 2017-10-10 NOTE — Progress Notes (Signed)
Pt given nebulization treatment. Breath sounds improved. Left upper side slight expiratory wheezing. Lower breath sounds clear bilaterally.

## 2017-10-10 NOTE — Progress Notes (Signed)
Pt alert and oriented x4 ; denies any CP or SOB while ambulating , pt states " I feel a lot better"

## 2017-10-10 NOTE — Discharge Summary (Addendum)
Physician Discharge Summary  [name] QIO:[id number] DOB: 09-Apr-1965 DOA: 10/09/2017  PCP: Vivi Barrack, MD  Admit date: 10/09/2017 Discharge date: 10/10/2017  Admitted From: home Discharge disposition: home   Recommendations for Outpatient Follow-Up:   Does not tolerate PO iron-- consider IV iron at the outpatient infusion center Patient to schedule GI follow up for gastric ulcers Stop NSAIDs outpatient cardiology follow up Cbc 1 week  Discharge Diagnosis:   Principal Problem:   Symptomatic anemia Active Problems:   Diabetes mellitus type 1 ([place])   Iron deficiency anemia due to chronic blood loss   Hypertension associated with diabetes ([place])   Leg swelling    Discharge Condition: Improved.  Diet recommendation: Low sodium, heart healthy.    Wound care: None.  Code status: Full.   History of Present Illness:  [name] is a 52 y.o. male with medical history significant for insulin-dependent diabetes mellitus, hypertension, GERD, and iron deficiency anemia, now presenting to the emergency department for evaluation of dyspnea on exertion, fatigue, and bilateral lower extremity edema.  Patient reports that symptoms have developed insidiously over months, becoming more severe in recent weeks and now limiting his activity.  He was evaluated by his PCP for these complaints and found to have elevated d-dimer and low hemoglobin; he was directed to the ED for further evaluation.  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  He was started on iron supplements, but reports being unable to tolerate GI upset that resulted.  He has not noted any melena, hematochezia, or constipation.  Has intermittent epigastric discomfort and indigestion for which he takes a daily Nexium.  Patient also uses oral diclofenac twice daily, frequent Goody's powders, and a daily baby aspirin.  Denies any chest pain, fevers, or  chills.  Denies vomiting or diarrhea.   Hospital Course by Problem:    1. 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 Insulin-dependent DM  - A1c was 10.0% in August  - Managed at home with Lantus 10 units BID, Humalog 7-15 TID, and metformin  -defer to PCP  3. Hypertension  - BP at goal  - Continue ARB   4. Bilateral leg edema  - Venous US with prelim read negative for DVT - Likely secondary to severe iron-deficiency - improved at d/c -echo done and was normal  Medical Consultants:      Discharge Exam:   Vitals:   10/10/17 1315 10/10/17 1529  BP: 140/85 126/80  Pulse: 62 64  Resp: 17 18  Temp: 99.1 F (37.3 C) 99.3 F (37.4 C)  SpO2: 99% 99%   Vitals:   10/10/17 1259 10/10/17 1300 10/10/17 1315 10/10/17 1529  BP: 130/83 130/83 140/85 126/80  Pulse: 63 63 62 64  Resp: 18 18 17 18   Temp: 99  F (37.2 C) 99 F (37.2 C) 99.1 F (37.3 C) 99.3 F (37.4 C)  TempSrc: Oral Oral Oral Oral  SpO2: 98% 98% 99% 99%  Weight:      Height:        General exam: Appears calm and comfortable.  No SOB, no dizziness   The results of significant diagnostics from this hospitalization (including imaging, microbiology, ancillary and laboratory) are listed below for reference.     Procedures and Diagnostic Studies:   Ct Angio Chest Pe W And/or Wo Contrast  Result Date: 10/09/2017 CLINICAL DATA:  Low hemoglobin with bilateral leg  swelling and weakness for 2 weeks. Dyspnea. EXAM: CT ANGIOGRAPHY CHEST WITH CONTRAST TECHNIQUE: Multidetector CT imaging of the chest was performed using the standard protocol during bolus administration of intravenous contrast. Multiplanar CT image reconstructions and MIPs were obtained to evaluate the vascular anatomy. CONTRAST:  163mL ISOVUE-370 IOPAMIDOL (ISOVUE-370) INJECTION 76% COMPARISON:  09/05/2002 chest radiograph FINDINGS: Cardiovascular: Limited by motion artifacts. The study is of quality for the evaluation of pulmonary embolism. There are no filling defects in the central, lobar and segmental pulmonary artery branches to suggest acute pulmonary embolism. Great vessels are normal in course and caliber. Normal heart size. No significant pericardial fluid/thickening. Mediastinum/Nodes: No discrete thyroid nodules. Unremarkable esophagus. No pathologically enlarged axillary, mediastinal or hilar lymph nodes. Subcentimeter short axis bilateral hilar lymph nodes. 11 mm short axis right lower paratracheal lymph node. Findings may be reactive in etiology. Lungs/Pleura: No pneumothorax. No pleural effusion. Centrilobular emphysema bilaterally with bibasilar dependent atelectasis. Upper abdomen: Unremarkable. Musculoskeletal:  No aggressive appearing focal osseous lesions. Review of the MIP images confirms the above findings. IMPRESSION: 1. Centrilobular emphysema. 2. No active pulmonary disease. 3. No acute pulmonary embolus. Emphysema (ICD10-J43.9). Electronically Signed   By: Ashley Royalty M.D.   On: 10/09/2017 19:44   Dg Chest Port 1 View  Result Date: 10/10/2017 CLINICAL DATA:  Patient admitted yesterday and developed SOB after blood transfusion, possible fluid buildup EXAM: PORTABLE CHEST 1 VIEW COMPARISON:  CT 1 day prior FINDINGS: Normal cardiac silhouette. Mild hilar adenopathy. Mild central venous congestion. No infiltrate. No overt pulmonary edema. No pneumothorax. IMPRESSION: Mild central venous  congestion. Mild hilar adenopathy. Electronically Signed   By: Suzy Bouchard M.D.   On: 10/10/2017 12:00     Labs:   Basic Metabolic Panel: Recent Labs  Lab 10/09/17 1010 10/09/17 1529 10/10/17 0542  NA 138 140 138  K 4.3 4.3 3.8  CL 107 108 106  CO2 25 21* 23  GLUCOSE 196* 249* 211*  BUN 18 12 14   CREATININE 0.91 1.01 0.86  CALCIUM 8.8 8.8* 8.6*   GFR Estimated Creatinine Clearance: 104.9 mL/min (by C-G formula based on SCr of 0.86 mg/dL). Liver Function Tests: Recent Labs  Lab 10/09/17 1010 10/09/17 1529  AST 21 28  ALT 17 21  ALKPHOS 158* 160*  BILITOT 0.2 0.4  PROT 6.7 6.6  ALBUMIN 3.8 3.5   No results for input(s): LIPASE, AMYLASE in the last 168 hours. No results for input(s): AMMONIA in the last 168 hours. Coagulation profile No results for input(s): INR, PROTIME in the last 168 hours.  CBC: Recent Labs  Lab 10/09/17 1010 10/09/17 1529 10/10/17 0542  WBC 6.2 5.6 6.5  HGB 5.4* 5.3* 6.8*  HCT 18.8* 20.4* 24.5*  MCV 60.2* 66.4* 69.0*  PLT 325.0 347 277   Cardiac Enzymes: No results for input(s): CKTOTAL, CKMB, CKMBINDEX, TROPONINI in the last 168 hours. BNP: Invalid input(s): POCBNP  CBG: Recent Labs  Lab 10/09/17 2051 10/10/17 0109 10/10/17 0758 10/10/17 1201  GLUCAP 119* 403* 262* 259*   D-Dimer Recent Labs    10/09/17 1010  DDIMER 0.65*   Hgb A1c No results for input(s): HGBA1C in the last 72 hours. Lipid Profile No results for input(s): CHOL, HDL, LDLCALC, TRIG, CHOLHDL, LDLDIRECT in the last 72 hours. Thyroid function studies No results for input(s): TSH, T4TOTAL, T3FREE, THYROIDAB in the last 72 hours.  Invalid input(s): FREET3 Anemia work up Recent Labs    10/09/17 1010 10/10/17 0542  VITAMINB12  --  335  FOLATE  --  14.0  FERRITIN 4* 4*  TIBC 478* 469*  IRON <10* 22*  RETICCTPCT  --  0.7   Microbiology No results found for this or any previous visit (from the past 240 hour(s)).   Discharge Instructions:    Discharge Instructions    Diet - low sodium heart healthy   Complete by:  As directed    Discharge instructions   Complete by:  As directed    Per Dr. Henrene Pastor: Avoid NSAIDs Surveillance colonoscopy 5 years.  Routine GI follow-up one year (last visit 05/2016)   Increase activity slowly   Complete by:  As directed      Allergies as of 10/10/2017      Reactions   Crestor [rosuvastatin Calcium] Other (See Comments)   Muscle aches   Iron Other (See Comments)   "Tore up my stomach"   Lipitor [atorvastatin Calcium] Other (See Comments)   Muscles aches   Statins Other (See Comments)   Muscle aches      Medication List    STOP taking these medications   BC HEADACHE POWDER PO   ibuprofen 200 MG tablet Commonly known as:  ADVIL,MOTRIN     TAKE these medications   esomeprazole 40 MG capsule Commonly known as:  NEXIUM TAKE (1) CAPSULE BY MOUTH ONCE DAILY.   FREESTYLE LIBRE SENSOR SYSTEM Misc Use four times daily as needed to check blood sugar.   Insulin Glargine 100 UNIT/ML Solostar Pen Commonly known as:  LANTUS INJECT 10 UNITS S.Q. TWICE DAILY. What changed:    how much to take  how to take this  when to take this  additional instructions   insulin lispro 100 UNIT/ML KiwkPen Commonly known as:  HUMALOG INJECT 7-12 UNITS UNDER THE SKIN THREE TIMES DAILY WITH MEALS. What changed:    how much to take  how to take this  when to take this  additional instructions   Insulin Pen Needle 32G X 4 MM Misc Use 4x a day   B-D ULTRAFINE III SHORT PEN 31G X 8 MM Misc Generic drug:  Insulin Pen Needle USE 4 TIMES DAILY.   irbesartan 300 MG tablet Commonly known as:  AVAPRO Take 0.5 tablets (150 mg total) by mouth daily. Follow-up appt is due  must see provider for future refills What changed:  how much to take   metFORMIN 500 MG 24 hr tablet Commonly known as:  GLUCOPHAGE-XR Take 2 tablets (1,000 mg total) by mouth every evening.   nicotine 14 mg/24hr  patch Commonly known as:  NICODERM CQ - dosed in mg/24 hours Place 1 patch (14 mg total) onto the skin daily. Start taking on:  10/11/2017   sildenafil 20 MG tablet Commonly known as:  REVATIO Take 1-5 tablets (20-100 mg total) by mouth daily as needed (erectile dysfunction).      Follow-up Information    Vivi Barrack, MD  Follow up in 1 week(s).   Specialty:  Family Medicine Why:  will need periodic IV iron infusions Contact information: [place] [address] [phone]        Irene Shipper, MD Follow up.   Specialty:  Gastroenterology Contact information: [address] [place] [address] [phone]            Time coordinating discharge: 25 min  Signed:  Geradine Girt  [facility] 10/10/2017, 5:08 PM

## 2017-10-10 NOTE — Progress Notes (Signed)
Pts hemoglobin and iron counts are very low. Currently hospitalized due to symptomatic anemia.  [name]. [name], MD 10/10/2017 8:07 AM

## 2017-10-10 NOTE — Progress Notes (Signed)
Spoke to [employer], stated based on assessment. Pt can continue with blood transfusion.

## 2017-10-11 ENCOUNTER — Telehealth: Payer: Self-pay | Admitting: *Deleted

## 2017-10-11 ENCOUNTER — Encounter: Payer: Self-pay | Admitting: Family Medicine

## 2017-10-11 LAB — TYPE AND SCREEN
ABO/RH(D): A POS
Antibody Screen: NEGATIVE
UNIT DIVISION: 0
UNIT DIVISION: 0
UNIT DIVISION: 0

## 2017-10-11 LAB — BPAM RBC
Blood Product Expiration Date: 201909282359
Blood Product Expiration Date: 201910132359
Blood Product Expiration Date: 201910132359
ISSUE DATE / TIME: 201909242106
ISSUE DATE / TIME: 201909251249
ISSUE DATE / TIME: 201909242347
UNIT TYPE AND RH: 6200
UNIT TYPE AND RH: 6200
Unit Type and Rh: 600

## 2017-10-11 NOTE — Telephone Encounter (Signed)
DC Summary not finished at this time.  Per telephone call: Transition Care Management Follow-up Telephone Call   Date discharged? 10/11/17   How have you been since you were released from the hospital? "good, better"   Do you understand why you were in the hospital? yes   Do you understand the discharge instructions? yes   Where were you discharged to? Home   Items Reviewed:  Medications reviewed: yes  Allergies reviewed: yes  Dietary changes reviewed: yes  Referrals reviewed: yes   Functional Questionnaire:   Activities of Daily Living (ADLs):   He states they are independent in the following: ambulation, bathing and hygiene, feeding, continence, grooming, toileting and dressing States they require assistance with the following: none   Any transportation issues/concerns?: no   Any patient concerns? no   Confirmed importance and date/time of follow-up visits scheduled yes  Provider Appointment booked with Dr [name] 10/17/17 8:40  Confirmed with patient if condition begins to worsen call PCP or go to the ER.  Patient was given the office number and encouraged to call back with question or concerns.  : yes

## 2017-10-17 ENCOUNTER — Ambulatory Visit (INDEPENDENT_AMBULATORY_CARE_PROVIDER_SITE_OTHER): Payer: Commercial Managed Care - PPO | Admitting: Family Medicine

## 2017-10-17 ENCOUNTER — Encounter: Payer: Self-pay | Admitting: Family Medicine

## 2017-10-17 VITALS — BP 140/78 | HR 74 | Temp 98.3°F | Ht 67.0 in | Wt 186.0 lb

## 2017-10-17 DIAGNOSIS — E1065 Type 1 diabetes mellitus with hyperglycemia: Secondary | ICD-10-CM | POA: Diagnosis not present

## 2017-10-17 DIAGNOSIS — I10 Essential (primary) hypertension: Secondary | ICD-10-CM

## 2017-10-17 DIAGNOSIS — Z23 Encounter for immunization: Secondary | ICD-10-CM | POA: Diagnosis not present

## 2017-10-17 DIAGNOSIS — D649 Anemia, unspecified: Secondary | ICD-10-CM | POA: Diagnosis not present

## 2017-10-17 DIAGNOSIS — E1159 Type 2 diabetes mellitus with other circulatory complications: Secondary | ICD-10-CM

## 2017-10-17 DIAGNOSIS — K922 Gastrointestinal hemorrhage, unspecified: Secondary | ICD-10-CM

## 2017-10-17 LAB — CBC
HCT: 28.9 % — ABNORMAL LOW (ref 39.0–52.0)
Hemoglobin: 8.7 g/dL — ABNORMAL LOW (ref 13.0–17.0)
MCHC: 30.2 g/dL (ref 30.0–36.0)
MCV: 66.9 fl — ABNORMAL LOW (ref 78.0–100.0)
PLATELETS: 202 10*3/uL (ref 150.0–400.0)
RBC: 4.32 Mil/uL (ref 4.22–5.81)
RDW: 27.3 % — ABNORMAL HIGH (ref 11.5–15.5)
WBC: 7.7 10*3/uL (ref 4.0–10.5)

## 2017-10-17 MED ORDER — IRBESARTAN 300 MG PO TABS: 300.0000 mg | ORAL_TABLET | Freq: Every day | ORAL | Status: DC

## 2017-10-17 NOTE — Progress Notes (Signed)
Subjective:  [name] is a 52 y.o. male who presents today for a TCM visit.  HPI:  Summary of Hospital admission: Reason for admission: Symptomatic anemia Date of admission: 10/09/2017 Date of discharge: 10/10/2017 Date of Interactive contact: 10/11/2017 Summary of Hospital course: Patient presented to the ED on 10/09/2017 with shortness of breath, dyspnea on exertion, and lower extremity edema in the setting of a hemoglobin of 5.3.  He was admitted for blood transfusion.  Patient received 3 units of packed red blood cells and had improvement of hemoglobin to 7.6 and was discharged home.  Patient will stop using all NSAIDs and recommended to follow-up with GI.  Symptomatic anemia/GI bleed Patient feels significantly improved since his hospitalization.  Denies any episodes of hematochezia or melena.  No hematemesis.  He has stopped all NSAID medications.  No chest pain or shortness of breath.  No exertional dyspnea.  No lower extremity swelling.  He has tried taking oral iron past however has not been able to tolerate due to GI discomfort.  ROS: Per HPI, otherwise a complete review of systems was negative.   PMH:  The following were reviewed and entered/updated in epic: Past Medical History:  Diagnosis Date  . Allergy   . Anemia   . Diabetes mellitus without complication ([place])   . GERD (gastroesophageal reflux disease)   . HLD (hyperlipidemia)   . Hypertension    Patient Active Problem List   Diagnosis Date Noted  . Gastrointestinal hemorrhage 10/18/2017  . Leg edema 08/31/2017  . Erectile dysfunction 05/14/2017  . Carpal tunnel syndrome 01/04/2017  . Hypertension associated with diabetes ([place]) 01/04/2017  . Iron deficiency anemia due to chronic blood loss 01/20/2016  . Diabetes mellitus type 1 ([place]) 01/18/2016  . Hyperlipidemia due to type 1 diabetes mellitus ([place]) 01/18/2016  . Symptomatic anemia 01/18/2016  . Gastroesophageal reflux disease without esophagitis  01/18/2016   Past Surgical History:  Procedure Laterality Date  . ANKLE SURGERY Right   . UPPER GASTROINTESTINAL ENDOSCOPY    . VASECTOMY  1994    Family History  Problem Relation Age of Onset  . COPD Mother   . Diabetes Mother   . Early death Father 74       MI  . Heart attack Father   . Lymphoma Sister 58  . Hypertension Brother   . Diabetes Maternal Grandmother   . Stroke Neg Hx   . Alcohol abuse Neg Hx   . Drug abuse Neg Hx   . Hyperlipidemia Neg Hx   . Kidney disease Neg Hx   . Colon cancer Neg Hx   . Esophageal cancer Neg Hx   . Stomach cancer Neg Hx   . Rectal cancer Neg Hx     Medications- Reconciled discharge and current medications in Epic.  Current Outpatient Medications  Medication Sig Dispense Refill  . B-D ULTRAFINE III SHORT PEN 31G X 8 MM MISC USE 4 TIMES DAILY. 100 each 0  . Continuous Blood Gluc Sensor (FREESTYLE LIBRE SENSOR SYSTEM) MISC Use four times daily as needed to check blood sugar. 1 each 0  . esomeprazole (NEXIUM) 40 MG capsule TAKE (1) CAPSULE BY MOUTH ONCE DAILY. 90 capsule 3  . Insulin Glargine (LANTUS SOLOSTAR) 100 UNIT/ML Solostar Pen INJECT 10 UNITS S.Q. TWICE DAILY. (Patient taking differently: Inject 10 Units into the skin See admin instructions. Inject 10 units into the skin before breakfast and 10 units after supper) 15 mL 5  . insulin lispro (HUMALOG KWIKPEN)  100 UNIT/ML KiwkPen INJECT 7-12 UNITS UNDER THE SKIN THREE TIMES DAILY WITH MEALS. (Patient taking differently: Inject 7-12 Units into the skin 3 (three) times daily with meals. ) 15 mL 5  . Insulin Pen Needle 32G X 4 MM MISC Use 4x a day 200 each 5  . irbesartan (AVAPRO) 300 MG tablet Take 1 tablet (300 mg total) by mouth daily. Follow-up appt is due  must see provider for future refills    . metFORMIN (GLUCOPHAGE-XR) 500 MG 24 hr tablet Take 2 tablets (1,000 mg total) by mouth every evening. 60 tablet 11  . nicotine (NICODERM CQ - DOSED IN MG/24 HOURS) 14 mg/24hr patch Place 1  patch (14 mg total) onto the skin daily. 28 patch 0  . sildenafil (REVATIO) 20 MG tablet Take 1-5 tablets (20-100 mg total) by mouth daily as needed (erectile dysfunction). 90 tablet 3   No current facility-administered medications for this visit.     Allergies-reviewed and updated Allergies  Allergen Reactions  . Crestor [Rosuvastatin Calcium] Other (See Comments)    Muscle aches  . Iron Other (See Comments)    "Tore up my stomach"  . Lipitor [Atorvastatin Calcium] Other (See Comments)    Muscles aches  . Statins Other (See Comments)    Muscle aches    Social History   Socioeconomic History  . Marital status: Married    Spouse name: Not on file  . Number of children: 1  . Years of education: Not on file  . Highest education level: Not on file  Occupational History  . Occupation: Barista  . Financial resource strain: Not on file  . Food insecurity:    Worry: Not on file    Inability: Not on file  . Transportation needs:    Medical: Not on file    Non-medical: Not on file  Tobacco Use  . Smoking status: Current Every Day Smoker    Packs/day: 1.00    Types: Cigarettes  . Smokeless tobacco: Never Used  Substance and Sexual Activity  . Alcohol use: Yes    Alcohol/week: 8.0 standard drinks    Types: 8 Cans of beer per week    Comment: occ  . Drug use: No  . Sexual activity: Yes  Lifestyle  . Physical activity:    Days per week: Not on file    Minutes per session: Not on file  . Stress: Not on file  Relationships  . Social connections:    Talks on phone: Not on file    Gets together: Not on file    Attends religious service: Not on file    Active member of club or organization: Not on file    Attends meetings of clubs or organizations: Not on file    Relationship status: Not on file  Other Topics Concern  . Not on file  Social History Narrative  . Not on file    Objective:  Physical Exam: BP 140/78 (BP Location: Left Arm, Patient  Position: Sitting, Cuff Size: Normal)   Pulse 74   Temp 98.3 F (36.8 C) (Oral)   Ht 5\' 7"  (1.702 m)   Wt 186 lb (84.4 kg)   SpO2 98%   BMI 29.13 kg/m   Gen: NAD, resting comfortably CV: RRR with no murmurs appreciated Pulm: NWOB, CTAB with no crackles, wheezes, or rhonchi GI: Normal bowel sounds present. Soft, Nontender, Nondistended. MSK: No edema, cyanosis, or clubbing noted Skin: Warm, dry Neuro: Grossly normal, moves all extremities  Psych: Normal affect and thought content   Assessment/Plan:  Symptomatic anemia Doing much better.  Recommended patient try extended release oral iron to see if this is able to be tolerated.  Check CBC today.  Referral to GI pending.  Gastrointestinal hemorrhage See above.  Check CBC.  Continue Nexium 40 mg daily.  Take oral iron as tolerated.  Referral to GI pending.  Hypertension associated with diabetes ([place]) Patient's dose of irbesartan was half while hospitalized.  We will increase back to 300 mg daily given his borderline blood pressure today.  Preventive health care Flu shot given today  Patient has a moderate level of medical complexity due to number of diagnoses/treatment options and amount/complexity of data reviewed.   [name]. [name], MD 10/18/2017 5:01 PM

## 2017-10-17 NOTE — Patient Instructions (Signed)
It was very nice to see you today!  We will check your blood counts today.  You can try using the oral iron.  We will send you to GI - they can help set up the iron infusion if needed.   Take care, Dr [name]

## 2017-10-18 ENCOUNTER — Encounter: Payer: Self-pay | Admitting: Family Medicine

## 2017-10-18 DIAGNOSIS — K922 Gastrointestinal hemorrhage, unspecified: Secondary | ICD-10-CM | POA: Insufficient documentation

## 2017-10-18 NOTE — Progress Notes (Signed)
Dr Marigene Ehlers interpretation of your lab work:  Your blood counts are improving. Please try taking the oral iron as we discussed and follow up with the GI doctor.  I will see you back in November for your follow up visit.  If you have any additional questions, please give [place] a call or send [place] a message through [place].  Take care, Dr [name]

## 2017-10-18 NOTE — Assessment & Plan Note (Signed)
See above.  Check CBC.  Continue Nexium 40 mg daily.  Take oral iron as tolerated.  Referral to GI pending.

## 2017-10-18 NOTE — Assessment & Plan Note (Signed)
Doing much better.  Recommended patient try extended release oral iron to see if this is able to be tolerated.  Check CBC today.  Referral to GI pending.

## 2017-10-18 NOTE — Assessment & Plan Note (Signed)
Patient's dose of irbesartan was half while hospitalized.  We will increase back to 300 mg daily given his borderline blood pressure today.

## 2017-11-13 ENCOUNTER — Other Ambulatory Visit: Payer: Self-pay

## 2017-11-13 ENCOUNTER — Encounter: Payer: Self-pay | Admitting: Internal Medicine

## 2017-11-13 ENCOUNTER — Ambulatory Visit (INDEPENDENT_AMBULATORY_CARE_PROVIDER_SITE_OTHER): Payer: Commercial Managed Care - PPO | Admitting: Internal Medicine

## 2017-11-13 ENCOUNTER — Other Ambulatory Visit (INDEPENDENT_AMBULATORY_CARE_PROVIDER_SITE_OTHER): Payer: Commercial Managed Care - PPO

## 2017-11-13 VITALS — BP 142/86 | HR 60 | Ht 67.0 in | Wt 184.2 lb

## 2017-11-13 DIAGNOSIS — K5520 Angiodysplasia of colon without hemorrhage: Secondary | ICD-10-CM | POA: Diagnosis not present

## 2017-11-13 DIAGNOSIS — K259 Gastric ulcer, unspecified as acute or chronic, without hemorrhage or perforation: Secondary | ICD-10-CM | POA: Diagnosis not present

## 2017-11-13 DIAGNOSIS — D509 Iron deficiency anemia, unspecified: Secondary | ICD-10-CM | POA: Diagnosis not present

## 2017-11-13 LAB — CBC WITH DIFFERENTIAL/PLATELET
BASOS ABS: 0.1 10*3/uL (ref 0.0–0.1)
BASOS PCT: 1.2 % (ref 0.0–3.0)
EOS ABS: 0.2 10*3/uL (ref 0.0–0.7)
EOS PCT: 3.3 % (ref 0.0–5.0)
HCT: 27.8 % — ABNORMAL LOW (ref 39.0–52.0)
Hemoglobin: 8.8 g/dL — ABNORMAL LOW (ref 13.0–17.0)
LYMPHS PCT: 20.4 % (ref 12.0–46.0)
Lymphs Abs: 1.5 10*3/uL (ref 0.7–4.0)
MCHC: 31.5 g/dL (ref 30.0–36.0)
MCV: 70.5 fl — ABNORMAL LOW (ref 78.0–100.0)
MONO ABS: 0.6 10*3/uL (ref 0.1–1.0)
MONOS PCT: 7.8 % (ref 3.0–12.0)
NEUTROS ABS: 5.1 10*3/uL (ref 1.4–7.7)
NEUTROS PCT: 67.3 % (ref 43.0–77.0)
PLATELETS: 353 10*3/uL (ref 150.0–400.0)
RBC: 3.94 Mil/uL — ABNORMAL LOW (ref 4.22–5.81)
RDW: 28 % — AB (ref 11.5–15.5)
WBC: 7.6 10*3/uL (ref 4.0–10.5)

## 2017-11-13 LAB — FERRITIN: Ferritin: 7.5 ng/mL — ABNORMAL LOW (ref 22.0–322.0)

## 2017-11-13 MED ORDER — FERROUS SULFATE DRIED ER 160 (50 FE) MG PO TBCR: 1.0000 | EXTENDED_RELEASE_TABLET | Freq: Two times a day (BID) | ORAL | 6 refills | Status: DC

## 2017-11-13 NOTE — Progress Notes (Signed)
HISTORY OF PRESENT ILLNESS:  [name] is a 52 y.o. male who was sent today by his primary care provider regarding recurrent iron deficiency anemia.  The patient was evaluated in this office May 17, 2016 for iron deficiency anemia follow-up after he underwent colonoscopy and upper endoscopy April 11, 2016.  See that dictation for details.  Upper endoscopy revealed gastric ulcer secondary to NSAIDs.  He also has chronic GERD requiring PPI for control.  Colonoscopy revealed a diminutive adenoma as well as angiodysplastic lesions of the right colon.  He was placed on iron therapy but did not tolerate this.  He has stopped NSAIDs and continued on PPI.  Earlier this year he noted progressive fatigue.  Blood work with his primary provider revealed significant anemia with a hemoglobin of 5.3 and MCV 66.4.  He was hospitalized and transfused.  He was recently started on low-dose oral iron once daily which she is tolerating.  His last hemoglobin October 17, 2017 was 8.7.  He states that he is feeling better.  GI review of systems is negative.  REVIEW OF SYSTEMS:  All non-GI ROS negative as otherwise stated in the HPI   Past Medical History:  Diagnosis Date  . Allergy   . Anemia   . Diabetes mellitus without complication ([place])   . GERD (gastroesophageal reflux disease)   . HLD (hyperlipidemia)   . Hypertension     Past Surgical History:  Procedure Laterality Date  . ANKLE SURGERY Right   . UPPER GASTROINTESTINAL ENDOSCOPY    . [place]  reports that he has been smoking cigarettes. He has been smoking about 1.00 pack per day. He has never used smokeless tobacco. He reports that he drinks about 8.0 standard drinks of alcohol per week. He reports that he does not use drugs.  family history includes COPD in his mother; Diabetes in his maternal grandmother and mother; Early death (age of onset: 37) in his father; Heart attack in his father; Hypertension in his  brother; Lymphoma (age of onset: 49) in his sister.  Allergies  Allergen Reactions  . Crestor [Rosuvastatin Calcium] Other (See Comments)    Muscle aches  . Iron Other (See Comments)    "Tore up my stomach"  . Lipitor [Atorvastatin Calcium] Other (See Comments)    Muscles aches  . Statins Other (See Comments)    Muscle aches       PHYSICAL EXAMINATION: Vital signs: BP (!) 142/86   Pulse 60   Ht 5\' 7"  (1.702 m)   Wt 184 lb 3.2 oz (83.6 kg)   BMI 28.85 kg/m   Constitutional: generally well-appearing, no acute distress Psychiatric: alert and oriented x3, cooperative Eyes: extraocular movements intact, anicteric, conjunctiva pink Mouth: oral pharynx moist, no lesions Neck: supple no lymphadenopathy Cardiovascular: heart regular rate and rhythm, no murmur Lungs: clear to auscultation bilaterally Abdomen: soft, nontender, nondistended, no obvious ascites, no peritoneal signs, normal bowel sounds, no organomegaly Rectal: Omitted Extremities: no clubbing, cyanosis, or lower extremity edema bilaterally Skin: no lesions on visible extremities Neuro: No focal deficits.  Cranial nerves intact  ASSESSMENT:  1.  Recurrent iron deficiency anemia.  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 3.  Upper endoscopy March 2018 with NSAID related ulcer 4.  GERD.  Requires PPI to control symptoms   PLAN:  1.  Increase iron to twice daily 2.  Repeat CBC and  ferritin today 3.  Repeat blood work 6 weeks 4.  Continue to avoid NSAIDs 5.  Surveillance colonoscopy around March 2023 6.  If anemia persists despite oral iron then refer to hematology or PCP to arrange outpatient iron infusion therapy.  Discussed with patient 7.  Continue PPI for management of GERD 8.  GI follow-up to be determined  25-minute spent face-to-face with the patient.  Greater than 50% the time is for counseling regarding his recurrent iron deficiency anemia  and its management plan  ADDENDUM Laboratories have returned.  Essentially unchanged from 4 weeks ago.  Keep recommendation of increasing iron to twice daily and repeat labs in 6 weeks

## 2017-11-13 NOTE — Patient Instructions (Signed)
Your provider has requested that you go to the basement level for lab work before leaving today. Press "B" on the elevator. The lab is located at the first door on the left as you exit the elevator.  We have sent the following medications to your pharmacy for you to pick up at your convenience:  Iron - increase to twice a day

## 2017-12-03 ENCOUNTER — Ambulatory Visit (INDEPENDENT_AMBULATORY_CARE_PROVIDER_SITE_OTHER): Payer: Commercial Managed Care - PPO | Admitting: Family Medicine

## 2017-12-03 ENCOUNTER — Other Ambulatory Visit: Payer: Self-pay | Admitting: Family Medicine

## 2017-12-03 ENCOUNTER — Encounter: Payer: Self-pay | Admitting: Family Medicine

## 2017-12-03 VITALS — BP 138/84 | HR 90 | Temp 98.7°F | Ht 67.0 in | Wt 183.0 lb

## 2017-12-03 DIAGNOSIS — Z634 Disappearance and death of family member: Secondary | ICD-10-CM

## 2017-12-03 DIAGNOSIS — R05 Cough: Secondary | ICD-10-CM

## 2017-12-03 DIAGNOSIS — E1159 Type 2 diabetes mellitus with other circulatory complications: Secondary | ICD-10-CM

## 2017-12-03 DIAGNOSIS — E785 Hyperlipidemia, unspecified: Secondary | ICD-10-CM

## 2017-12-03 DIAGNOSIS — I10 Essential (primary) hypertension: Secondary | ICD-10-CM

## 2017-12-03 DIAGNOSIS — E1069 Type 1 diabetes mellitus with other specified complication: Secondary | ICD-10-CM

## 2017-12-03 DIAGNOSIS — E1065 Type 1 diabetes mellitus with hyperglycemia: Secondary | ICD-10-CM | POA: Diagnosis not present

## 2017-12-03 DIAGNOSIS — R059 Cough, unspecified: Secondary | ICD-10-CM

## 2017-12-03 LAB — POCT GLYCOSYLATED HEMOGLOBIN (HGB A1C): Hemoglobin A1C: 8.3 % — AB (ref 4.0–5.6)

## 2017-12-03 MED ORDER — INSULIN PEN NEEDLE 32G X 4 MM MISC: 5 refills | Status: DC

## 2017-12-03 MED ORDER — IPRATROPIUM BROMIDE 0.06 % NA SOLN: 2.0000 | Freq: Four times a day (QID) | NASAL | 0 refills | Status: DC

## 2017-12-03 MED ORDER — AZITHROMYCIN 250 MG PO TABS: ORAL_TABLET | ORAL | 0 refills | Status: DC

## 2017-12-03 MED ORDER — BENZONATATE 200 MG PO CAPS: 200.0000 mg | ORAL_CAPSULE | Freq: Two times a day (BID) | ORAL | 0 refills | Status: DC | PRN

## 2017-12-03 NOTE — Assessment & Plan Note (Signed)
At goal.  Continue irbesartan 300 mg daily.

## 2017-12-03 NOTE — Progress Notes (Signed)
Subjective:  [name] is a 52 y.o. male who presents today with a chief complaint of cough.   HPI:  Cough Started a few days ago. Worsening over that time. Some sputum production. Tried mucinex which has not helped. No fevers or chills. Some wheezing.  No other treatments tried.  No known sick contacts.  T2DM, chronic problem Currently on Lantus 10 units twice daily and Humalog 7 to 12 units 3 times daily.  He is also on metformin 1000 mg daily. Fasting sugars usually in the 120s-170s.  HIs grandson was tragically killed by an automobile about 3 weeks ago and he reports several dietary indiscretions since then.  He has had a few sugars that have read as "high."   HTN Currently on irbesartan 300 mg daily.  Harding without side effects.  No reported chest pain or shortness of breath.  HLD Not on any medication.  Has not been able to tolerate statins in the past due to myalgias.  ROS: Per HPI  PMH: He reports that he has been smoking cigarettes. He has been smoking about 1.00 pack per day. He has never used smokeless tobacco. He reports that he drinks about 8.0 standard drinks of alcohol per week. He reports that he does not use drugs.  Objective:  Physical Exam: BP 138/84 (BP Location: Left Arm, Patient Position: Sitting, Cuff Size: Normal)   Pulse 90   Temp 98.7 F (37.1 C) (Oral)   Ht 5\' 7"  (1.702 m)   Wt 183 lb (83 kg)   SpO2 97%   BMI 28.66 kg/m   Wt Readings from Last 3 Encounters:  12/03/17 183 lb (83 kg)  11/13/17 184 lb 3.2 oz (83.6 kg)  10/17/17 186 lb (84.4 kg)  Gen: NAD, resting comfortably HEENT: TMs clear.  Nasal mucosa erythematous and boggy bilaterally.  Oropharynx erythematous. CV: RRR with no murmurs appreciated Pulm: NWOB, CTAB with no crackles, wheezes, or rhonchi  Results for orders placed or performed in visit on 12/03/17 (from the past 24 hour(s))  POCT glycosylated hemoglobin (Hb A1C)     Status: Abnormal   Collection Time: 12/03/17  4:09 PM    Result Value Ref Range   Hemoglobin A1C 8.3 (A) 4.0 - 5.6 %   HbA1c POC (<> result, manual entry)     HbA1c, POC (prediabetic range)     HbA1c, POC (controlled diabetic range)       Assessment/Plan:  Hypertension associated with diabetes ([place]) At goal.  Continue irbesartan 300 mg daily.  Hyperlipidemia due to T1DM (HCC) - Statin Intolerant Check lipid panel next blood draw.  Diabetes mellitus type 1 (HCC) A1c improved to 8.3 today.  Fasting sugars near range.  We will continue Lantus 10 units twice daily and Humalog 7 to 12 units 3 times daily with meals.  Continue metformin 1000 mg daily.  Follow-up with me in 3 months.  Cough Likely secondary to viral URI. No signs of bacterial infection. Start atrovent for rhinorrhea/sinus congestion. Start Tessalon for cough. Sent in a "pocket prescription" for azithromycin with strict instruction to not start unless symptoms worsen or fail to improve within the next several days. Recommended tylenol and/or motrin as needed for low grade fever and pain. Encouraged good oral hydration. Return precautions reviewed. Follow up as needed.   Bereavement Offered words of support and encouragement for patient.  Seems to have a good support structure with family regarding recent death of his grandson.  Discussed reasons to return to care.  Follow-up as needed.   [name]. [name], MD 12/03/2017 4:50 PM

## 2017-12-03 NOTE — Assessment & Plan Note (Signed)
A1c improved to 8.3 today.  Fasting sugars near range.  We will continue Lantus 10 units twice daily and Humalog 7 to 12 units 3 times daily with meals.  Continue metformin 1000 mg daily.  Follow-up with me in 3 months.

## 2017-12-03 NOTE — Assessment & Plan Note (Signed)
Check lipid panel next blood draw.  

## 2017-12-03 NOTE — Patient Instructions (Signed)
Start the atrovent and tessalon  Start the zpack if your symptoms worsen or do not improve in a few days.  Please stay well hydrated.  You can take tylenol and/or motrin as needed for low grade fever and pain.  Please let me know if your symptoms worsen or fail to improve.  Your blood sugar looks great. No other changes today.  Come back to see me in 3 months, or sooner as needed.   Take care, Dr [name]

## 2018-01-28 ENCOUNTER — Other Ambulatory Visit (INDEPENDENT_AMBULATORY_CARE_PROVIDER_SITE_OTHER): Payer: Commercial Managed Care - PPO

## 2018-01-28 ENCOUNTER — Other Ambulatory Visit: Payer: Self-pay

## 2018-01-28 DIAGNOSIS — D509 Iron deficiency anemia, unspecified: Secondary | ICD-10-CM

## 2018-01-28 LAB — CBC WITH DIFFERENTIAL/PLATELET
BASOS PCT: 0.7 % (ref 0.0–3.0)
Basophils Absolute: 0.1 10*3/uL (ref 0.0–0.1)
EOS ABS: 0.2 10*3/uL (ref 0.0–0.7)
Eosinophils Relative: 2 % (ref 0.0–5.0)
HEMATOCRIT: 37.7 % — AB (ref 39.0–52.0)
Hemoglobin: 12.5 g/dL — ABNORMAL LOW (ref 13.0–17.0)
Lymphocytes Relative: 18.4 % (ref 12.0–46.0)
Lymphs Abs: 1.4 10*3/uL (ref 0.7–4.0)
MCHC: 33.2 g/dL (ref 30.0–36.0)
MCV: 83.5 fl (ref 78.0–100.0)
Monocytes Absolute: 0.5 10*3/uL (ref 0.1–1.0)
Monocytes Relative: 6.7 % (ref 3.0–12.0)
Neutro Abs: 5.7 10*3/uL (ref 1.4–7.7)
Neutrophils Relative %: 72.2 % (ref 43.0–77.0)
Platelets: 312 10*3/uL (ref 150.0–400.0)
RBC: 4.52 Mil/uL (ref 4.22–5.81)
RDW: 20.2 % — AB (ref 11.5–15.5)
WBC: 7.9 10*3/uL (ref 4.0–10.5)

## 2018-01-28 LAB — FERRITIN: Ferritin: 37.9 ng/mL (ref 22.0–322.0)

## 2018-03-04 ENCOUNTER — Ambulatory Visit: Payer: Commercial Managed Care - PPO | Admitting: Family Medicine

## 2018-03-08 ENCOUNTER — Encounter: Payer: Self-pay | Admitting: Family Medicine

## 2018-03-08 ENCOUNTER — Ambulatory Visit (INDEPENDENT_AMBULATORY_CARE_PROVIDER_SITE_OTHER): Payer: Commercial Managed Care - PPO | Admitting: Family Medicine

## 2018-03-08 ENCOUNTER — Ambulatory Visit: Payer: Commercial Managed Care - PPO | Admitting: Family Medicine

## 2018-03-08 VITALS — BP 122/80 | HR 70 | Temp 98.0°F | Ht 67.0 in | Wt 187.2 lb

## 2018-03-08 DIAGNOSIS — E1069 Type 1 diabetes mellitus with other specified complication: Secondary | ICD-10-CM

## 2018-03-08 DIAGNOSIS — Z6829 Body mass index (BMI) 29.0-29.9, adult: Secondary | ICD-10-CM

## 2018-03-08 DIAGNOSIS — E1065 Type 1 diabetes mellitus with hyperglycemia: Secondary | ICD-10-CM

## 2018-03-08 DIAGNOSIS — E785 Hyperlipidemia, unspecified: Secondary | ICD-10-CM

## 2018-03-08 DIAGNOSIS — E1159 Type 2 diabetes mellitus with other circulatory complications: Secondary | ICD-10-CM | POA: Diagnosis not present

## 2018-03-08 DIAGNOSIS — I10 Essential (primary) hypertension: Secondary | ICD-10-CM

## 2018-03-08 LAB — POCT GLYCOSYLATED HEMOGLOBIN (HGB A1C): Hemoglobin A1C: 10.2 % — AB (ref 4.0–5.6)

## 2018-03-08 MED ORDER — INSULIN GLARGINE 100 UNIT/ML SOLOSTAR PEN: 20.0000 [IU] | PEN_INJECTOR | Freq: Every day | SUBCUTANEOUS | 5 refills | Status: DC

## 2018-03-08 MED ORDER — METFORMIN HCL ER 500 MG PO TB24: 1000.0000 mg | ORAL_TABLET | Freq: Two times a day (BID) | ORAL | 3 refills | Status: DC

## 2018-03-08 NOTE — Assessment & Plan Note (Signed)
A1c well above goal.  Likely secondary to medication nonadherence and dietary indiscretions.  His fasting sugars are very labile- it is difficult to titrate his dose of Lantus at this time.  We will increase his metformin to 1000 mg twice daily.  Also switch his Lantus to 20 units daily in the morning.  He will follow-up with me in 3 months, or sooner as needed.  Discussed importance of low glycemic diet.

## 2018-03-08 NOTE — Assessment & Plan Note (Signed)
Continue lifestyle modifications. Check lipid panel with next blood draw.

## 2018-03-08 NOTE — Patient Instructions (Signed)
It was very nice to see you today!  Please take 20 units of lantus every morning. Let me know if you have any symptomatic lows or highs.  Please take your metformin twice daily.  Come back in 3 months, or sooner as needed.   Take care, Dr [name]

## 2018-03-08 NOTE — Progress Notes (Signed)
   Chief Complaint:  [name] is a 53 y.o. male who presents today with a chief complaint of T2DM follow up.   Assessment/Plan:  Diabetes mellitus type 1 (HCC) A1c well above goal.  Likely secondary to medication nonadherence and dietary indiscretions.  His fasting sugars are very labile- it is difficult to titrate his dose of Lantus at this time.  We will increase his metformin to 1000 mg twice daily.  Also switch his Lantus to 20 units daily in the morning.  He will follow-up with me in 3 months, or sooner as needed.  Discussed importance of low glycemic diet.   Hypertension associated with diabetes ([place]) At goal. Continue irbesartan 300mg  daily.   Hyperlipidemia due to T1DM ([place]) - Statin Intolerant Continue lifestyle modifications. Check lipid panel with next blood draw.   BMI 29 Continue lifestyle modifications.     Subjective:  HPI:  # T2DM  - On lantus 10 units twice daily and humalog 7 to 12 units three times daily - Fasting sugars: 70s-250s -He has had a few symptomatic lows.  These usually occur at night.  He will take a sugary beverage or snack which usually resolve this. -He has been out of metformin and has occasionally forgot to take his Humalog.  # Essential Hypertension - On irbesartan 300mg  daily and tolerating well - ROS: No reported chest pain or shortness of breath  # Dyslipidemia - Diet controlled. Has not tolerated statin in the past due to myalgia  ROS: Per HPI  PMH: He reports that he has been smoking cigarettes. He has been smoking about 1.00 pack per day. He has never used smokeless tobacco. He reports current alcohol use of about 8.0 standard drinks of alcohol per week. He reports that he does not use drugs.      Objective:  Physical Exam: BP 122/80 (BP Location: Left Arm, Patient Position: Sitting, Cuff Size: Normal)   Pulse 70   Temp 98 F (36.7 C) (Oral)   Ht 5\' 7"  (1.702 m)   Wt 187 lb 4 oz (84.9 kg)   SpO2 99%   BMI 29.33 kg/m     Gen: NAD, resting comfortably CV: Regular rate and rhythm with no murmurs appreciated Pulm: Normal work of breathing, clear to auscultation bilaterally with no crackles, wheezes, or rhonchi  Results for orders placed or performed in visit on 03/08/18 (from the past 24 hour(s))  POCT HgB A1C     Status: Abnormal   Collection Time: 03/08/18  4:19 PM  Result Value Ref Range   Hemoglobin A1C 10.2 (A) 4.0 - 5.6 %        [name] M. [name], MD 03/08/2018 5:05 PM

## 2018-03-08 NOTE — Assessment & Plan Note (Signed)
At goal. Continue irbesartan 300mg  daily.

## 2018-03-31 ENCOUNTER — Encounter: Payer: Self-pay | Admitting: Family Medicine

## 2018-04-01 ENCOUNTER — Other Ambulatory Visit: Payer: Self-pay

## 2018-04-01 DIAGNOSIS — E1065 Type 1 diabetes mellitus with hyperglycemia: Secondary | ICD-10-CM

## 2018-04-01 MED ORDER — INSULIN GLARGINE 100 UNIT/ML SOLOSTAR PEN: 20.0000 [IU] | PEN_INJECTOR | Freq: Every day | SUBCUTANEOUS | 3 refills | Status: DC

## 2018-04-01 MED ORDER — IPRATROPIUM BROMIDE 0.06 % NA SOLN: 2.0000 | Freq: Four times a day (QID) | NASAL | 0 refills | Status: DC

## 2018-04-01 MED ORDER — ESOMEPRAZOLE MAGNESIUM 40 MG PO CPDR: DELAYED_RELEASE_CAPSULE | ORAL | 3 refills | Status: DC

## 2018-04-01 MED ORDER — IRBESARTAN 300 MG PO TABS: 300.0000 mg | ORAL_TABLET | Freq: Every day | ORAL | 0 refills | Status: DC

## 2018-04-02 ENCOUNTER — Other Ambulatory Visit: Payer: Self-pay

## 2018-04-02 MED ORDER — IPRATROPIUM BROMIDE 0.06 % NA SOLN: 2.0000 | Freq: Four times a day (QID) | NASAL | 0 refills | Status: DC

## 2018-04-24 ENCOUNTER — Encounter: Payer: Self-pay | Admitting: Family Medicine

## 2018-05-17 ENCOUNTER — Other Ambulatory Visit: Payer: Self-pay

## 2018-05-17 DIAGNOSIS — D509 Iron deficiency anemia, unspecified: Secondary | ICD-10-CM

## 2018-05-17 NOTE — Progress Notes (Signed)
cbc

## 2018-06-04 ENCOUNTER — Ambulatory Visit: Payer: Commercial Managed Care - PPO | Admitting: Family Medicine

## 2018-07-18 ENCOUNTER — Other Ambulatory Visit: Payer: Self-pay | Admitting: Family Medicine

## 2018-07-18 DIAGNOSIS — E1065 Type 1 diabetes mellitus with hyperglycemia: Secondary | ICD-10-CM

## 2018-08-03 ENCOUNTER — Other Ambulatory Visit: Payer: Self-pay | Admitting: Family Medicine

## 2018-10-18 ENCOUNTER — Other Ambulatory Visit: Payer: Self-pay | Admitting: Family Medicine

## 2018-10-18 DIAGNOSIS — E1065 Type 1 diabetes mellitus with hyperglycemia: Secondary | ICD-10-CM

## 2018-11-16 ENCOUNTER — Other Ambulatory Visit: Payer: Self-pay | Admitting: Family Medicine

## 2019-01-08 ENCOUNTER — Other Ambulatory Visit: Payer: Self-pay

## 2019-01-08 ENCOUNTER — Ambulatory Visit
Admission: EM | Admit: 2019-01-08 | Discharge: 2019-01-08 | Disposition: A | Discharge status: Home / Self Care | Payer: Commercial Managed Care - PPO | Attending: Urgent Care | Admitting: Urgent Care

## 2019-01-08 DIAGNOSIS — F17210 Nicotine dependence, cigarettes, uncomplicated: Secondary | ICD-10-CM | POA: Diagnosis not present

## 2019-01-08 DIAGNOSIS — F17200 Nicotine dependence, unspecified, uncomplicated: Secondary | ICD-10-CM

## 2019-01-08 DIAGNOSIS — I10 Essential (primary) hypertension: Secondary | ICD-10-CM

## 2019-01-08 DIAGNOSIS — E1065 Type 1 diabetes mellitus with hyperglycemia: Secondary | ICD-10-CM

## 2019-01-08 DIAGNOSIS — R030 Elevated blood-pressure reading, without diagnosis of hypertension: Secondary | ICD-10-CM

## 2019-01-08 DIAGNOSIS — Z20828 Contact with and (suspected) exposure to other viral communicable diseases: Secondary | ICD-10-CM | POA: Diagnosis not present

## 2019-01-08 DIAGNOSIS — E119 Type 2 diabetes mellitus without complications: Secondary | ICD-10-CM

## 2019-01-08 DIAGNOSIS — Z20822 Contact with and (suspected) exposure to covid-19: Secondary | ICD-10-CM

## 2019-01-08 NOTE — ED Provider Notes (Addendum)
[facility]   MRN: [id number] DOB: [birth year]  Subjective:   [name] is a 53 y.o. male presenting for COVID 19 testing. Had exposure through his son. Smokes 1ppd. Has HTN, DM. Gets regular f/u with his PCP.  Last A1c was greater than 10% earlier this year.  No current facility-administered medications for this encounter.  Current Outpatient Medications:  .  B-D ULTRAFINE III SHORT PEN 31G X 8 MM MISC, USE 4 TIMES DAILY., Disp: 100 each, Rfl: 0 .  Continuous Blood Gluc Sensor (FREESTYLE LIBRE SENSOR SYSTEM) MISC, Use four times daily as needed to check blood sugar., Disp: 1 each, Rfl: 0 .  esomeprazole (NEXIUM) 40 MG capsule, TAKE (1) CAPSULE BY MOUTH ONCE DAILY., Disp: 90 capsule, Rfl: 3 .  ferrous sulfate (SLOW RELEASE IRON) 160 (50 Fe) MG TBCR SR tablet, Take 1 tablet (160 mg total) by mouth 2 (two) times daily., Disp: 60 tablet, Rfl: 6 .  HUMALOG KWIKPEN 100 UNIT/ML KwikPen, INJECT 7-12 UNITS SUBCUTANEOUSLY THREE TIMES DAILY WITH MEALS., Disp: 15 mL, Rfl: 0 .  Insulin Glargine (LANTUS SOLOSTAR) 100 UNIT/ML Solostar Pen, Inject 20 Units into the skin daily., Disp: 15 mL, Rfl: 3 .  insulin lispro (HUMALOG KWIKPEN) 100 UNIT/ML KiwkPen, INJECT 7-12 UNITS UNDER THE SKIN THREE TIMES DAILY WITH MEALS. (Patient taking differently: Inject 7-12 Units into the skin 3 (three) times daily with meals. ), Disp: 15 mL, Rfl: 5 .  Insulin Pen Needle 32G X 4 MM MISC, Use 4x a day, Disp: 200 each, Rfl: 5 .  ipratropium (ATROVENT) 0.06 % nasal spray, PLACE 2 SPRAYS IN BOTH NOSTRILS 4 TIMES DAILY., Disp: 15 mL, Rfl: 0 .  irbesartan (AVAPRO) 300 MG tablet, TAKE (1) TABLET BY MOUTH ONCE DAILY., Disp: 90 tablet, Rfl: 0 .  metFORMIN (GLUCOPHAGE-XR) 500 MG 24 hr tablet, Take 2 tablets (1,000 mg total) by mouth 2 (two) times daily., Disp: 360 tablet, Rfl: 3 .  sildenafil (REVATIO) 20 MG tablet, Take 1-5 tablets (20-100 mg total) by mouth daily as needed (erectile dysfunction)., Disp: 90 tablet, Rfl: 3     Allergies  Allergen Reactions  . Crestor [Rosuvastatin Calcium] Other (See Comments)    Muscle aches  . Iron Other (See Comments)    "Tore up my stomach"  . Lipitor [Atorvastatin Calcium] Other (See Comments)    Muscles aches  . Statins Other (See Comments)    Muscle aches    Past Medical History:  Diagnosis Date  . Allergy   . Anemia   . Diabetes mellitus without complication ([place])   . GERD (gastroesophageal reflux disease)   . HLD (hyperlipidemia)   . Hypertension      Past Surgical History:  Procedure Laterality Date  . ANKLE SURGERY Right   . UPPER GASTROINTESTINAL ENDOSCOPY    . VASECTOMY  1994    Family History  Problem Relation Age of Onset  . COPD Mother   . Diabetes Mother   . Early death Father 58       MI  . Heart attack Father   . Lymphoma Sister 76  . Hypertension Brother   . Diabetes Maternal Grandmother   . Stroke Neg Hx   . Alcohol abuse Neg Hx   . Drug abuse Neg Hx   . Hyperlipidemia Neg Hx   . Kidney disease Neg Hx   . Colon cancer Neg Hx   . Esophageal cancer Neg Hx   . Stomach cancer Neg Hx   . Rectal cancer Neg  Hx     Social History   Tobacco Use  . Smoking status: Current Every Day Smoker    Packs/day: 1.00    Types: Cigarettes  . Smokeless tobacco: Never Used  Substance Use Topics  . Alcohol use: Yes    Alcohol/week: 8.0 standard drinks    Types: 8 Cans of beer per week    Comment: occ  . Drug use: No    Review of Systems  Constitutional: Negative for fever and malaise/fatigue.  HENT: Negative for congestion, ear pain, sinus pain and sore throat.   Eyes: Negative for discharge and redness.  Respiratory: Negative for cough, hemoptysis, shortness of breath and wheezing.   Cardiovascular: Negative for chest pain.  Gastrointestinal: Negative for abdominal pain, diarrhea, nausea and vomiting.  Genitourinary: Negative for dysuria, flank pain and hematuria.  Musculoskeletal: Negative for myalgias.  Skin: Negative for rash.   Neurological: Negative for dizziness, weakness and headaches.  Psychiatric/Behavioral: Negative for depression and substance abuse.     Objective:   Vitals: BP (!) 153/88 (BP Location: Right Arm)   Pulse 88   Temp 98.7 F (37.1 C) (Oral)   Resp 16   SpO2 95%   Physical Exam Constitutional:      General: He is not in acute distress.    Appearance: Normal appearance. He is well-developed. He is not ill-appearing, toxic-appearing or diaphoretic.  HENT:     Head: Normocephalic and atraumatic.     Right Ear: External ear normal.     Left Ear: External ear normal.     Nose: Nose normal.     Mouth/Throat:     Mouth: Mucous membranes are moist.     Pharynx: Oropharynx is clear.  Eyes:     General: No scleral icterus.    Extraocular Movements: Extraocular movements intact.     Pupils: Pupils are equal, round, and reactive to light.  Cardiovascular:     Rate and Rhythm: Normal rate and regular rhythm.     Heart sounds: Normal heart sounds. No murmur. No friction rub. No gallop.   Pulmonary:     Effort: Pulmonary effort is normal. No respiratory distress.     Breath sounds: Normal breath sounds. No stridor. No wheezing, rhonchi or rales.  Skin:    General: Skin is warm and dry.  Neurological:     Mental Status: He is alert and oriented to person, place, and time.  Psychiatric:        Mood and Affect: Mood normal.        Behavior: Behavior normal.        Thought Content: Thought content normal.        Judgment: Judgment normal.     Assessment and Plan :   1. Exposure to COVID-19 virus   2. Uncontrolled type 1 diabetes mellitus with hyperglycemia ([place])   3. Essential hypertension   4. Elevated blood pressure reading   5. Tobacco use disorder     Will manage for clinical diagnosis of COVID-19 given close exposure to his son. Counseled patient on nature of COVID-19 including modes of transmission, diagnostic testing, management and supportive care.  Offered symptomatic  relief.  Discussed with patient that he is high risk given that he has uncontrolled diabetes, reviewed strict ER precautions.  Counseled patient on potential for adverse effects with medications prescribed/recommended today, ER and return-to-clinic precautions discussed, patient verbalized understanding.    Jaynee Eagles, PA-C 01/08/19 1056

## 2019-01-08 NOTE — ED Triage Notes (Signed)
Pt presents to UC stating he would like a covid test for positive covid exposure. Pt denies symptoms at this time.

## 2019-01-08 NOTE — Discharge Instructions (Addendum)

## 2019-01-09 LAB — NOVEL CORONAVIRUS, NAA: SARS-CoV-2, NAA: NOT DETECTED

## 2019-01-16 ENCOUNTER — Other Ambulatory Visit: Payer: Self-pay | Admitting: Family Medicine

## 2019-02-01 ENCOUNTER — Other Ambulatory Visit: Payer: Self-pay | Admitting: Family Medicine

## 2019-02-01 DIAGNOSIS — E1065 Type 1 diabetes mellitus with hyperglycemia: Secondary | ICD-10-CM

## 2019-02-03 ENCOUNTER — Encounter: Payer: Self-pay | Admitting: Family Medicine

## 2019-02-06 ENCOUNTER — Other Ambulatory Visit: Payer: Self-pay

## 2019-02-06 ENCOUNTER — Other Ambulatory Visit: Payer: Self-pay | Admitting: Family Medicine

## 2019-02-06 DIAGNOSIS — E1065 Type 1 diabetes mellitus with hyperglycemia: Secondary | ICD-10-CM

## 2019-02-06 MED ORDER — IRBESARTAN 300 MG PO TABS: 300.0000 mg | ORAL_TABLET | Freq: Every day | ORAL | 0 refills | Status: DC

## 2019-02-06 MED ORDER — IPRATROPIUM BROMIDE 0.06 % NA SOLN: NASAL | 0 refills | Status: DC

## 2019-02-18 ENCOUNTER — Other Ambulatory Visit: Payer: Self-pay | Admitting: Family Medicine

## 2019-03-04 ENCOUNTER — Ambulatory Visit (INDEPENDENT_AMBULATORY_CARE_PROVIDER_SITE_OTHER): Payer: Commercial Managed Care - PPO | Admitting: Family Medicine

## 2019-03-04 ENCOUNTER — Other Ambulatory Visit: Payer: Self-pay

## 2019-03-04 ENCOUNTER — Telehealth: Payer: Self-pay | Admitting: Family Medicine

## 2019-03-04 ENCOUNTER — Encounter: Payer: Self-pay | Admitting: Family Medicine

## 2019-03-04 VITALS — BP 164/102 | HR 90 | Temp 97.6°F | Ht 67.0 in | Wt 187.2 lb

## 2019-03-04 DIAGNOSIS — S0081XA Abrasion of other part of head, initial encounter: Secondary | ICD-10-CM

## 2019-03-04 DIAGNOSIS — E1065 Type 1 diabetes mellitus with hyperglycemia: Secondary | ICD-10-CM

## 2019-03-04 DIAGNOSIS — I10 Essential (primary) hypertension: Secondary | ICD-10-CM

## 2019-03-04 DIAGNOSIS — E1159 Type 2 diabetes mellitus with other circulatory complications: Secondary | ICD-10-CM | POA: Diagnosis not present

## 2019-03-04 DIAGNOSIS — K219 Gastro-esophageal reflux disease without esophagitis: Secondary | ICD-10-CM

## 2019-03-04 LAB — POCT GLYCOSYLATED HEMOGLOBIN (HGB A1C): Hemoglobin A1C: 7.3 % — AB (ref 4.0–5.6)

## 2019-03-04 MED ORDER — ESOMEPRAZOLE MAGNESIUM 40 MG PO CPDR: DELAYED_RELEASE_CAPSULE | ORAL | 3 refills | Status: DC

## 2019-03-04 NOTE — Telephone Encounter (Signed)
Patient scheduled.

## 2019-03-04 NOTE — Assessment & Plan Note (Signed)
A1c much improved at 7.3.  We will continue Lantus 20 units daily, Metformin 1000 mg twice daily, and Humalog per sliding scale.  Recheck A1c in 3 to 6 months.

## 2019-03-04 NOTE — Assessment & Plan Note (Signed)
Refilled Nexium 

## 2019-03-04 NOTE — Assessment & Plan Note (Signed)
Above goal today in setting of acute pain.  Previously been well controlled.  Continue irbesartan 300 mg daily.  Follow-up at next office visit.
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
## 2019-03-04 NOTE — Telephone Encounter (Signed)
Nurse Assessment Nurse: Chesley Noon, RN, Lattie Haw Date/Time Eilene Ghazi Time): 03/04/2019 8:27:54 AM Confirm and document reason for call. If symptomatic, describe symptoms. ---Caller states he fell Sunday night and hit his head and his nose. He thinks his head may be infected and would like an appt. No fever. He cleaned it and put polysporin on it and has a yellow discharge from it. Has the patient had close contact with a person known or suspected to have the novel coronavirus illness OR traveled / lives in area with major community spread (including international travel) in the last 14 days from the onset of symptoms? * If Asymptomatic, screen for exposure and travel within the last 14 days. ---No Does the patient have any new or worsening symptoms? ---Yes Will a triage be completed? ---Yes Related visit to physician within the last 2 weeks? ---No Does the PT have any chronic conditions? (i.e. diabetes, asthma, this includes High risk factors for pregnancy, etc.) ---Yes List chronic conditions. ---diabetes Is this a behavioral health or substance abuse call? ---No Guidelines Guideline Title Affirmed Question Affirmed Notes Nurse Date/Time (Eastern Time) Wound Infection Black (necrotic) or blisters develop in wound Chesley Noon, RN, Lattie Haw 03/04/2019 8:31:10 AM Disp. Time Eilene Ghazi Time) Disposition Final User PLEASE NOTE: All timestamps contained within this report are represented as [place] Standard Time. CONFIDENTIALTY NOTICE: This fax transmission is intended only for the addressee. It contains information that is legally privileged, confidential or otherwise protected from use or disclosure. If you are not the intended recipient, you are strictly prohibited from reviewing, disclosing, copying using or disseminating any of this information or taking any action in reliance on or regarding this information. If you have received this fax in error, please notify us immediately by telephone so that we can  arrange for its return to [place]. Phone: [phone], Toll-Free: [phone], Fax: [phone] Page: 2 of 2 Call Id: [id number] 03/04/2019 8:34:28 AM Go to ED Now (or PCP triage) Yes Chesley Noon, RN, Leland Johns Disagree/Comply Comply Caller Understands Yes PreDisposition Call Doctor Care Advice Given Per Guideline Caller prefers office visit if able GO TO ED NOW (OR PCP TRIAGE): * IF NO PCP (PRIMARY CARE PROVIDER) SECONDLEVEL TRIAGE: You need to be seen within the next hour. Go to the [facility] at _____________ [place] as soon as you can. CARE ADVICE per Wound Infection (Adult) guideline. Comments User: Dinah Beers, RN Date/Time Eilene Ghazi Time): 03/04/2019 8:31:43 AM pain from head wound 3/10 User: Dinah Beers, RN Date/Time Eilene Ghazi Time): 03/04/2019 8:38:36 AM Back line contacted per caller request for office appt, caller transferred to [facility] after providing caller information and outcome. Referrals GO TO FACILITY REFUSED

## 2019-03-04 NOTE — Patient Instructions (Signed)
It was very nice to see you today!  Your preparation should heal up normally with no issues.  Please let me know if you have any signs concerning for infection such as spreading redness or severe pain.  Please keep the area as clean and dry as possible.  Your blood sugar looks great today.  Keep up the good work.  Come back in 3 to 6 months for your annual check with blood work, or sooner if needed.  Take care, Dr [name]  Please try these tips to maintain a healthy lifestyle:   Eat at least 3 REAL meals and 1-2 snacks per day.  Aim for no more than 5 hours between eating.  If you eat breakfast, please do so within one hour of getting up.    Each meal should contain half fruits/vegetables, one quarter protein, and one quarter carbs (no bigger than a computer mouse)   Cut down on sweet beverages. This includes juice, soda, and sweet tea.     Drink at least 1 glass of water with each meal and aim for at least 8 glasses per day   Exercise at least 150 minutes every week.

## 2019-03-18 ENCOUNTER — Ambulatory Visit: Payer: Commercial Managed Care - PPO | Attending: Internal Medicine

## 2019-03-18 ENCOUNTER — Other Ambulatory Visit: Payer: Self-pay

## 2019-03-18 DIAGNOSIS — Z20822 Contact with and (suspected) exposure to covid-19: Secondary | ICD-10-CM

## 2019-03-19 LAB — NOVEL CORONAVIRUS, NAA: SARS-CoV-2, NAA: NOT DETECTED

## 2019-03-23 ENCOUNTER — Encounter: Payer: Self-pay | Admitting: Family Medicine

## 2019-03-24 ENCOUNTER — Encounter: Payer: Self-pay | Admitting: Family Medicine

## 2019-03-24 ENCOUNTER — Other Ambulatory Visit: Payer: Self-pay | Admitting: Family Medicine

## 2019-03-24 ENCOUNTER — Other Ambulatory Visit: Payer: Self-pay

## 2019-03-24 MED ORDER — INSULIN LISPRO (1 UNIT DIAL) 100 UNIT/ML (KWIKPEN): PEN_INJECTOR | SUBCUTANEOUS | 0 refills | Status: DC

## 2019-04-01 ENCOUNTER — Encounter: Payer: Self-pay | Admitting: Family Medicine

## 2019-04-01 ENCOUNTER — Ambulatory Visit (INDEPENDENT_AMBULATORY_CARE_PROVIDER_SITE_OTHER): Payer: Commercial Managed Care - PPO | Admitting: Family Medicine

## 2019-04-01 ENCOUNTER — Other Ambulatory Visit: Payer: Self-pay

## 2019-04-01 VITALS — BP 136/84 | HR 89 | Temp 98.0°F | Ht 67.0 in | Wt 186.2 lb

## 2019-04-01 DIAGNOSIS — D500 Iron deficiency anemia secondary to blood loss (chronic): Secondary | ICD-10-CM | POA: Diagnosis not present

## 2019-04-01 DIAGNOSIS — Z0001 Encounter for general adult medical examination with abnormal findings: Secondary | ICD-10-CM

## 2019-04-01 DIAGNOSIS — I152 Hypertension secondary to endocrine disorders: Secondary | ICD-10-CM

## 2019-04-01 DIAGNOSIS — E663 Overweight: Secondary | ICD-10-CM

## 2019-04-01 DIAGNOSIS — I10 Essential (primary) hypertension: Secondary | ICD-10-CM

## 2019-04-01 DIAGNOSIS — Z125 Encounter for screening for malignant neoplasm of prostate: Secondary | ICD-10-CM

## 2019-04-01 DIAGNOSIS — E1159 Type 2 diabetes mellitus with other circulatory complications: Secondary | ICD-10-CM | POA: Diagnosis not present

## 2019-04-01 DIAGNOSIS — E1065 Type 1 diabetes mellitus with hyperglycemia: Secondary | ICD-10-CM | POA: Diagnosis not present

## 2019-04-01 DIAGNOSIS — E1069 Type 1 diabetes mellitus with other specified complication: Secondary | ICD-10-CM

## 2019-04-01 DIAGNOSIS — K219 Gastro-esophageal reflux disease without esophagitis: Secondary | ICD-10-CM

## 2019-04-01 DIAGNOSIS — E785 Hyperlipidemia, unspecified: Secondary | ICD-10-CM | POA: Diagnosis not present

## 2019-04-01 LAB — IBC + FERRITIN
Ferritin: 60 ng/mL (ref 22.0–322.0)
Iron: 220 ug/dL — ABNORMAL HIGH (ref 42–165)
Saturation Ratios: 60 % — ABNORMAL HIGH (ref 20.0–50.0)
Transferrin: 262 mg/dL (ref 212.0–360.0)

## 2019-04-01 LAB — LIPID PANEL
Cholesterol: 221 mg/dL — ABNORMAL HIGH (ref 0–200)
HDL: 98.9 mg/dL (ref >39.00)
LDL Cholesterol: 109 mg/dL — ABNORMAL HIGH (ref 0–99)
NonHDL: 121.92
Total CHOL/HDL Ratio: 2
Triglycerides: 66 mg/dL (ref 0.0–149.0)
VLDL: 13.2 mg/dL (ref 0.0–40.0)

## 2019-04-01 LAB — COMPREHENSIVE METABOLIC PANEL
ALT: 23 U/L (ref 0–53)
AST: 24 U/L (ref 0–37)
Albumin: 4.1 g/dL (ref 3.5–5.2)
Alkaline Phosphatase: 117 U/L (ref 39–117)
BUN: 19 mg/dL (ref 6–23)
CO2: 25 mEq/L (ref 19–32)
Calcium: 9.4 mg/dL (ref 8.4–10.5)
Chloride: 105 mEq/L (ref 96–112)
Creatinine, Ser: 1.05 mg/dL (ref 0.40–1.50)
GFR: 73.9 mL/min (ref >60.00)
Glucose, Bld: 72 mg/dL (ref 70–99)
Potassium: 5.1 mEq/L (ref 3.5–5.1)
Sodium: 139 mEq/L (ref 135–145)
Total Bilirubin: 0.3 mg/dL (ref 0.2–1.2)
Total Protein: 7.1 g/dL (ref 6.0–8.3)

## 2019-04-01 LAB — CBC
HCT: 44.1 % (ref 39.0–52.0)
Hemoglobin: 14.9 g/dL (ref 13.0–17.0)
MCHC: 33.8 g/dL (ref 30.0–36.0)
MCV: 99.3 fl (ref 78.0–100.0)
Platelets: 282 10*3/uL (ref 150.0–400.0)
RBC: 4.44 Mil/uL (ref 4.22–5.81)
RDW: 12.9 % (ref 11.5–15.5)
WBC: 6.8 10*3/uL (ref 4.0–10.5)

## 2019-04-01 LAB — PSA: PSA: 0.73 ng/mL (ref 0.10–4.00)

## 2019-04-01 LAB — VITAMIN B12: Vitamin B-12: 282 pg/mL (ref 211–911)

## 2019-04-01 NOTE — Assessment & Plan Note (Signed)
Check lipid panel.  Continue lifestyle modifications. 

## 2019-04-01 NOTE — Assessment & Plan Note (Signed)
At goal.  Continue irbesartan 300 mg daily.  Check CBC, C met, TSH.

## 2019-04-01 NOTE — Patient Instructions (Signed)
It was very nice to see you today!  We will check blood work today.  Keep an eye on your blood pressure and let me know if persistently 140/90 or higher.  Please come back in 3 to 6 months to recheck your A1c.  Take care, Dr [name]  Please try these tips to maintain a healthy lifestyle:   Eat at least 3 REAL meals and 1-2 snacks per day.  Aim for no more than 5 hours between eating.  If you eat breakfast, please do so within one hour of getting up.    Each meal should contain half fruits/vegetables, one quarter protein, and one quarter carbs (no bigger than a computer mouse)   Cut down on sweet beverages. This includes juice, soda, and sweet tea.     Drink at least 1 glass of water with each meal and aim for at least 8 glasses per day   Exercise at least 150 minutes every week.    Preventive Care 2-44 Years Old, Male Preventive care refers to lifestyle choices and visits with your health care provider that can promote health and wellness. This includes:  A yearly physical exam. This is also called an annual well check.  Regular dental and eye exams.  Immunizations.  Screening for certain conditions.  Healthy lifestyle choices, such as eating a healthy diet, getting regular exercise, not using drugs or products that contain nicotine and tobacco, and limiting alcohol use. What can I expect for my preventive care visit? Physical exam Your health care provider will check:  Height and weight. These may be used to calculate body mass index (BMI), which is a measurement that tells if you are at a healthy weight.  Heart rate and blood pressure.  Your skin for abnormal spots. Counseling Your health care provider may ask you questions about:  Alcohol, tobacco, and drug use.  Emotional well-being.  Home and relationship well-being.  Sexual activity.  Eating habits.  Work and work Statistician. What immunizations do I need?  Influenza (flu) vaccine  This is  recommended every year. Tetanus, diphtheria, and pertussis (Tdap) vaccine  You may need a Td booster every 10 years. Varicella (chickenpox) vaccine  You may need this vaccine if you have not already been vaccinated. Zoster (shingles) vaccine  You may need this after age 49. Measles, mumps, and rubella (MMR) vaccine  You may need at least one dose of MMR if you were born in 1957 or later. You may also need a second dose. Pneumococcal conjugate (PCV13) vaccine  You may need this if you have certain conditions and were not previously vaccinated. Pneumococcal polysaccharide (PPSV23) vaccine  You may need one or two doses if you smoke cigarettes or if you have certain conditions. Meningococcal conjugate (MenACWY) vaccine  You may need this if you have certain conditions. Hepatitis A vaccine  You may need this if you have certain conditions or if you travel or work in places where you may be exposed to hepatitis A. Hepatitis B vaccine  You may need this if you have certain conditions or if you travel or work in places where you may be exposed to hepatitis B. Haemophilus influenzae type b (Hib) vaccine  You may need this if you have certain risk factors. Human papillomavirus (HPV) vaccine  If recommended by your health care provider, you may need three doses over 6 months. You may receive vaccines as individual doses or as more than one vaccine together in one shot (combination vaccines). Talk with  your health care provider about the risks and benefits of combination vaccines. What tests do I need? Blood tests  Lipid and cholesterol levels. These may be checked every 5 years, or more frequently if you are over 41 years old.  Hepatitis C test.  Hepatitis B test. Screening  Lung cancer screening. You may have this screening every year starting at age 98 if you have a 30-pack-year history of smoking and currently smoke or have quit within the past 15 years.  Prostate cancer  screening. Recommendations will vary depending on your family history and other risks.  Colorectal cancer screening. All adults should have this screening starting at age 22 and continuing until age 51. Your health care provider may recommend screening at age 40 if you are at increased risk. You will have tests every 1-10 years, depending on your results and the type of screening test.  Diabetes screening. This is done by checking your blood sugar (glucose) after you have not eaten for a while (fasting). You may have this done every 1-3 years.  Sexually transmitted disease (STD) testing. Follow these instructions at home: Eating and drinking  Eat a diet that includes fresh fruits and vegetables, whole grains, lean protein, and low-fat dairy products.  Take vitamin and mineral supplements as recommended by your health care provider.  Do not drink alcohol if your health care provider tells you not to drink.  If you drink alcohol: ? Limit how much you have to 0-2 drinks a day. ? Be aware of how much alcohol is in your drink. In the [place] one drink equals one 12 oz bottle of beer (355 mL), one 5 oz glass of wine (148 mL), or one 1 oz glass of hard liquor (44 mL). Lifestyle  Take daily care of your teeth and gums.  Stay active. Exercise for at least 30 minutes on 5 or more days each week.  Do not use any products that contain nicotine or tobacco, such as cigarettes, e-cigarettes, and chewing tobacco. If you need help quitting, ask your health care provider.  If you are sexually active, practice safe sex. Use a condom or other form of protection to prevent STIs (sexually transmitted infections).  Talk with your health care provider about taking a low-dose aspirin every day starting at age 55. What's next?  Go to your health care provider once a year for a well check visit.  Ask your health care provider how often you should have your eyes and teeth checked.  Stay up to date on all  vaccines. This information is not intended to replace advice given to you by your health care provider. Make sure you discuss any questions you have with your health care provider. Document Revised: 12/27/2017 Document Reviewed: 12/27/2017 Elsevier Patient Education  2020 [employer].

## 2019-04-01 NOTE — Assessment & Plan Note (Signed)
Last A1c 7.4.  Continue Lantus 20 units daily, Humalog 7 to 12 units 3 times daily with meals, and Metformin 1000 mg twice daily.  Recheck A1c in 3 to 6 months.

## 2019-04-01 NOTE — Assessment & Plan Note (Signed)
Check CBC and iron panel.

## 2019-04-01 NOTE — Progress Notes (Signed)
Chief Complaint:  [name] is a 54 y.o. male who presents today for his annual comprehensive physical exam.    Assessment/Plan:  Chronic Problems Addressed Today: Hypertension associated with diabetes ([place]) At goal.  Continue irbesartan 300 mg daily.  Check CBC, C met, TSH.  Iron deficiency anemia due to chronic blood loss Check CBC and iron panel.  Gastroesophageal reflux disease without esophagitis Continue Nexium.  Check B12.  Hyperlipidemia due to T1DM ([place]) - Statin Intolerant Check lipid panel.  Continue lifestyle modifications.  Diabetes mellitus type 1 (HCC) Last A1c 7.4.  Continue Lantus 20 units daily, Humalog 7 to 12 units 3 times daily with meals, and Metformin 1000 mg twice daily.  Recheck A1c in 3 to 6 months.   Body mass index is 29.16 kg/m. / Overweight BMI Metric Follow Up - 04/01/19 0919      BMI Metric Follow Up-Please document annually   BMI Metric Follow Up  Education provided       Preventative Healthcare: Check CBC, C met, TSH, lipid panel, and PSA.  Up-to-date on colon cancer screening.  Will be getting Covid vaccine soon hopefully.   Patient Counseling(The following topics were reviewed and/or handout was given):  -Nutrition: Stressed importance of moderation in sodium/caffeine intake, saturated fat and cholesterol, caloric balance, sufficient intake of fresh fruits, vegetables, and fiber.  -Stressed the importance of regular exercise.   -Substance Abuse: Discussed cessation/primary prevention of tobacco, alcohol, or other drug use; driving or other dangerous activities under the influence; availability of treatment for abuse.   -Injury prevention: Discussed safety belts, safety helmets, smoke detector, smoking near bedding or upholstery.   -Sexuality: Discussed sexually transmitted diseases, partner selection, use of condoms, avoidance of unintended pregnancy and contraceptive alternatives.   -Dental health: Discussed importance of regular  tooth brushing, flossing, and dental visits.  -Health maintenance and immunizations reviewed. Please refer to Health maintenance section.  Return to care in 1 year for next preventative visit.     Subjective:  HPI:  He has no acute complaints today.   Lifestyle Diet: Balanced. Plenty of fruits and vegetables.  Exercise: Busy with work.   Depression screen PHQ 2/9 03/08/2018  Decreased Interest 0  Down, Depressed, Hopeless 0  PHQ - 2 Score 0    Health Maintenance Due  Topic Date Due  . OPHTHALMOLOGY EXAM  Never done  . FOOT EXAM  05/15/2018     ROS: Per HPI, otherwise a complete review of systems was negative.   PMH:  The following were reviewed and entered/updated in epic: Past Medical History:  Diagnosis Date  . Allergy   . Anemia   . Diabetes mellitus without complication ([place])   . GERD (gastroesophageal reflux disease)   . HLD (hyperlipidemia)   . Hypertension    Patient Active Problem List   Diagnosis Date Noted  . Gastrointestinal hemorrhage 10/18/2017  . Leg edema 08/31/2017  . Erectile dysfunction 05/14/2017  . Carpal tunnel syndrome 01/04/2017  . Hypertension associated with diabetes ([place]) 01/04/2017  . Iron deficiency anemia due to chronic blood loss 01/20/2016  . Diabetes mellitus type 1 ([place]) 01/18/2016  . Hyperlipidemia due to T1DM [facility]) - Statin Intolerant 01/18/2016  . Gastroesophageal reflux disease without esophagitis 01/18/2016   Past Surgical History:  Procedure Laterality Date  . ANKLE SURGERY Right   . UPPER GASTROINTESTINAL ENDOSCOPY    . VASECTOMY  1994    Family History  Problem Relation Age of Onset  . COPD Mother   .  Diabetes Mother   . Early death Father 64       MI  . Heart attack Father   . Lymphoma Sister 72  . Hypertension Brother   . Diabetes Maternal Grandmother   . Stroke Neg Hx   . Alcohol abuse Neg Hx   . Drug abuse Neg Hx   . Hyperlipidemia Neg Hx   . Kidney disease Neg Hx   . Colon cancer Neg Hx   .  Esophageal cancer Neg Hx   . Stomach cancer Neg Hx   . Rectal cancer Neg Hx     Medications- reviewed and updated Current Outpatient Medications  Medication Sig Dispense Refill  . B-D ULTRAFINE III SHORT PEN 31G X 8 MM MISC USE 4 TIMES DAILY. 100 each 0  . Continuous Blood Gluc Sensor (FREESTYLE LIBRE SENSOR SYSTEM) MISC Use four times daily as needed to check blood sugar. 1 each 0  . esomeprazole (NEXIUM) 40 MG capsule TAKE (1) CAPSULE BY MOUTH ONCE DAILY. 90 capsule 3  . ferrous sulfate (SLOW RELEASE IRON) 160 (50 Fe) MG TBCR SR tablet Take 1 tablet (160 mg total) by mouth 2 (two) times daily. 60 tablet 6  . insulin lispro (HUMALOG KWIKPEN) 100 UNIT/ML KiwkPen INJECT 7-12 UNITS UNDER THE SKIN THREE TIMES DAILY WITH MEALS. (Patient taking differently: Inject 7-12 Units into the skin 3 (three) times daily with meals. ) 15 mL 5  . insulin lispro (HUMALOG KWIKPEN) 100 UNIT/ML KwikPen INJECT 7-12 UNITS UNDER THE SKIN THREE TIMES DAILY WITH MEALS. 15 mL 0  . Insulin Pen Needle (BD PEN NEEDLE NANO U/F) 32G X 4 MM MISC USE 4 TIMES DAILY. 100 each 0  . ipratropium (ATROVENT) 0.06 % nasal spray PLACE 2 SPRAYS IN BOTH NOSTRILS 4 TIMES DAILY. 15 mL 0  . irbesartan (AVAPRO) 300 MG tablet Take 1 tablet (300 mg total) by mouth daily. 90 tablet 0  . LANTUS SOLOSTAR 100 UNIT/ML Solostar Pen INJECT 20 UNITS S.Q. ONCE DAILY. 15 mL 0  . metFORMIN (GLUCOPHAGE-XR) 500 MG 24 hr tablet TAKE 2 TABLETS BY MOUTH TWICE DAILY. 120 tablet 2  . sildenafil (REVATIO) 20 MG tablet Take 1-5 tablets (20-100 mg total) by mouth daily as needed (erectile dysfunction). 90 tablet 3   No current facility-administered medications for this visit.    Allergies-reviewed and updated Allergies  Allergen Reactions  . Crestor [Rosuvastatin Calcium] Other (See Comments)    Muscle aches  . Iron Other (See Comments)    "Tore up my stomach"  . Lipitor [Atorvastatin Calcium] Other (See Comments)    Muscles aches  . Statins Other (See  Comments)    Muscle aches    Social History   Socioeconomic History  . Marital status: Married    Spouse name: Not on file  . Number of children: 1  . Years of education: Not on file  . Highest education level: Not on file  Occupational History  . Occupation: Architect  Tobacco Use  . Smoking status: Current Every Day Smoker    Packs/day: 1.00    Types: Cigarettes  . Smokeless tobacco: Never Used  Substance and Sexual Activity  . Alcohol use: Yes    Alcohol/week: 8.0 standard drinks    Types: 8 Cans of beer per week    Comment: occ  . Drug use: No  . Sexual activity: Yes  Other Topics Concern  . Not on file  Social History Narrative  . Not on file   Social Determinants of Health  Financial Resource Strain:   . Difficulty of Paying Living Expenses:   Food Insecurity:   . Worried About Charity fundraiser in the Last Year:   . Arboriculturist in the Last Year:   Transportation Needs:   . Film/video editor (Medical):   Marland Kitchen Lack of Transportation (Non-Medical):   Physical Activity:   . Days of Exercise per Week:   . Minutes of Exercise per Session:   Stress:   . Feeling of Stress :   Social Connections:   . Frequency of Communication with Friends and Family:   . Frequency of Social Gatherings with Friends and Family:   . Attends Religious Services:   . Active Member of Clubs or Organizations:   . Attends Archivist Meetings:   Marland Kitchen Marital Status:         Objective:  Physical Exam: BP 136/84   Pulse 89   Temp 98 F (36.7 C)   Ht '5\' 7"'  (1.702 m)   Wt 186 lb 3.2 oz (84.5 kg)   SpO2 97%   BMI 29.16 kg/m   Body mass index is 29.16 kg/m. Wt Readings from Last 3 Encounters:  04/01/19 186 lb 3.2 oz (84.5 kg)  03/04/19 187 lb 4 oz (84.9 kg)  03/08/18 187 lb 4 oz (84.9 kg)   Gen: NAD, resting comfortably HEENT: TMs normal bilaterally. OP clear. No thyromegaly noted.  CV: RRR with no murmurs appreciated Pulm: NWOB, CTAB with no crackles,  wheezes, or rhonchi GI: Normal bowel sounds present. Soft, Nontender, Nondistended. MSK: no edema, cyanosis, or clubbing noted Skin: warm, dry Neuro: CN2-12 grossly intact. Strength 5/5 in upper and lower extremities. Reflexes symmetric and intact bilaterally.  Psych: Normal affect and thought content      M. [name], MD 04/01/2019 9:39 AM

## 2019-04-01 NOTE — Assessment & Plan Note (Signed)
Continue Nexium.  Check B12.

## 2019-04-02 NOTE — Progress Notes (Signed)
Please inform patient of the following:  Labs all stable.  Do not need to make any changes to his treatment plan at this time. We will see him back in a few months for his follow up visit.   [name]. [name], MD 04/02/2019 8:55 AM

## 2019-04-10 ENCOUNTER — Ambulatory Visit: Payer: Commercial Managed Care - PPO | Attending: Internal Medicine

## 2019-04-10 DIAGNOSIS — Z23 Encounter for immunization: Secondary | ICD-10-CM

## 2019-04-10 NOTE — Progress Notes (Signed)
   Covid-19 Vaccination Clinic  Name:  [name]    MRN: [id number] DOB: 06-17-1965  04/10/2019  [name] was observed post Covid-19 immunization for 15 minutes without incident. He was provided with Vaccine Information Sheet and instruction to access the V-Safe system.   [name] was instructed to call 911 with any severe reactions post vaccine: Marland Kitchen Difficulty breathing  . Swelling of face and throat  . A fast heartbeat  . A bad rash all over body  . Dizziness and weakness   Immunizations Administered    Name Date Dose VIS Date Route   Pfizer COVID-19 Vaccine 04/10/2019 11:45 AM 0.3 mL 12/27/2018 Intramuscular   Manufacturer: [place]   Lot: [id number]   [place]: [id number]

## 2019-04-29 ENCOUNTER — Other Ambulatory Visit: Payer: Self-pay | Admitting: Family Medicine

## 2019-05-05 ENCOUNTER — Ambulatory Visit: Payer: Commercial Managed Care - PPO | Attending: Internal Medicine

## 2019-05-05 DIAGNOSIS — Z23 Encounter for immunization: Secondary | ICD-10-CM

## 2019-05-05 NOTE — Progress Notes (Signed)
   Covid-19 Vaccination Clinic  Name:  [name]    MRN: [id number] DOB: 03-29-1965  05/05/2019  [name] was observed post Covid-19 immunization for 15 minutes without incident. He was provided with Vaccine Information Sheet and instruction to access the V-Safe system.   [name] was instructed to call 911 with any severe reactions post vaccine: Marland Kitchen Difficulty breathing  . Swelling of face and throat  . A fast heartbeat  . A bad rash all over body  . Dizziness and weakness   Immunizations Administered    Name Date Dose VIS Date Route   Pfizer COVID-19 Vaccine 05/05/2019 10:30 AM 0.3 mL 03/12/2018 Intramuscular   Manufacturer: [place]   Lot: [id number]   [place]: [id number]

## 2019-06-06 ENCOUNTER — Other Ambulatory Visit: Payer: Self-pay | Admitting: Family Medicine

## 2019-06-20 ENCOUNTER — Encounter: Payer: Self-pay | Admitting: Family Medicine

## 2019-06-20 ENCOUNTER — Other Ambulatory Visit: Payer: Self-pay | Admitting: Family Medicine

## 2019-07-31 ENCOUNTER — Ambulatory Visit: Payer: Commercial Managed Care - PPO | Admitting: Family Medicine

## 2019-08-02 ENCOUNTER — Other Ambulatory Visit: Payer: Self-pay | Admitting: Family Medicine

## 2019-08-02 DIAGNOSIS — E1065 Type 1 diabetes mellitus with hyperglycemia: Secondary | ICD-10-CM

## 2019-08-04 ENCOUNTER — Ambulatory Visit (INDEPENDENT_AMBULATORY_CARE_PROVIDER_SITE_OTHER): Payer: Commercial Managed Care - PPO | Admitting: Family Medicine

## 2019-08-04 ENCOUNTER — Encounter: Payer: Self-pay | Admitting: Family Medicine

## 2019-08-04 ENCOUNTER — Other Ambulatory Visit: Payer: Self-pay

## 2019-08-04 VITALS — BP 162/82 | HR 81 | Temp 98.6°F | Ht 67.0 in | Wt 191.2 lb

## 2019-08-04 DIAGNOSIS — E1065 Type 1 diabetes mellitus with hyperglycemia: Secondary | ICD-10-CM | POA: Diagnosis not present

## 2019-08-04 DIAGNOSIS — E1059 Type 1 diabetes mellitus with other circulatory complications: Secondary | ICD-10-CM | POA: Diagnosis not present

## 2019-08-04 DIAGNOSIS — I10 Essential (primary) hypertension: Secondary | ICD-10-CM | POA: Diagnosis not present

## 2019-08-04 DIAGNOSIS — N529 Male erectile dysfunction, unspecified: Secondary | ICD-10-CM

## 2019-08-04 DIAGNOSIS — E1159 Type 2 diabetes mellitus with other circulatory complications: Secondary | ICD-10-CM

## 2019-08-04 LAB — POCT GLYCOSYLATED HEMOGLOBIN (HGB A1C): Hemoglobin A1C: 7.1 % — AB (ref 4.0–5.6)

## 2019-08-04 MED ORDER — SILDENAFIL CITRATE 20 MG PO TABS: 20.0000 mg | ORAL_TABLET | Freq: Every day | ORAL | 3 refills | Status: DC | PRN

## 2019-08-04 NOTE — Progress Notes (Signed)
   [name] is a 54 y.o. male who presents today for an office visit.  Assessment/Plan:  Chronic Problems Addressed Today: Erectile dysfunction Stable.  Sildenafil refilled.  Hypertension associated with diabetes ([place]) Above goal.  Typically well controlled.  Continue irbesartan 3 mg daily.  Continue home monitoring goal 140/90 or lower.  Diabetes mellitus type 1 (HCC) A1c improved to 7.1.  Continue Lantus 20 units daily, Humalog 7 to 12 units 3 times daily with meals, and Metformin 1000 mg twice daily.  Recheck A1c in 6 months.    Subjective:  HPI:  See A/p.         Objective:  Physical Exam: BP (!) 162/82   Pulse 81   Temp 98.6 F (37 C)   Ht 5\' 7"  (1.702 m)   Wt 191 lb 3.2 oz (86.7 kg)   SpO2 98%   BMI 29.95 kg/m   Wt Readings from Last 3 Encounters:  08/04/19 191 lb 3.2 oz (86.7 kg)  04/01/19 186 lb 3.2 oz (84.5 kg)  03/04/19 187 lb 4 oz (84.9 kg)  Gen: No acute distress, resting comfortably CV: Regular rate and rhythm with no murmurs appreciated Pulm: Normal work of breathing, clear to auscultation bilaterally with no crackles, wheezes, or rhonchi Neuro: Grossly normal, moves all extremities Psych: Normal affect and thought content      [name] M. [name], MD 08/04/2019 2:24 PM

## 2019-08-04 NOTE — Assessment & Plan Note (Signed)
A1c improved to 7.1.  Continue Lantus 20 units daily, Humalog 7 to 12 units 3 times daily with meals, and Metformin 1000 mg twice daily.  Recheck A1c in 6 months.

## 2019-08-04 NOTE — Patient Instructions (Signed)
It was very nice to see you today!  Your blood sugar looks great.  Keep up the good work.  I will send in refills today.  Come back to see me in 6 months.  Please come back to see me sooner if needed.  Take care, Dr [name]  Please try these tips to maintain a healthy lifestyle:   Eat at least 3 REAL meals and 1-2 snacks per day.  Aim for no more than 5 hours between eating.  If you eat breakfast, please do so within one hour of getting up.    Each meal should contain half fruits/vegetables, one quarter protein, and one quarter carbs (no bigger than a computer mouse)   Cut down on sweet beverages. This includes juice, soda, and sweet tea.     Drink at least 1 glass of water with each meal and aim for at least 8 glasses per day   Exercise at least 150 minutes every week.

## 2019-08-04 NOTE — Assessment & Plan Note (Signed)
Stable.  Sildenafil refilled.

## 2019-08-04 NOTE — Assessment & Plan Note (Signed)
Above goal.  Typically well controlled.  Continue irbesartan 3 mg daily.  Continue home monitoring goal 140/90 or lower.

## 2019-08-07 ENCOUNTER — Other Ambulatory Visit: Payer: Self-pay | Admitting: Family Medicine

## 2019-08-30 ENCOUNTER — Other Ambulatory Visit: Payer: Self-pay | Admitting: Family Medicine

## 2019-09-01 MED ORDER — BD PEN NEEDLE SHORT U/F 31G X 8 MM MISC: 4 refills | Status: DC

## 2019-09-01 MED ORDER — INSULIN LISPRO (1 UNIT DIAL) 100 UNIT/ML (KWIKPEN): PEN_INJECTOR | SUBCUTANEOUS | 1 refills | Status: DC

## 2019-09-05 ENCOUNTER — Telehealth: Payer: Self-pay

## 2019-09-05 NOTE — Telephone Encounter (Signed)
error 

## 2019-09-05 NOTE — Telephone Encounter (Signed)
No record on covid vaccine registered on [facility].  Pt notified

## 2019-09-05 NOTE — Telephone Encounter (Signed)
Pt needs official documents of his covid vaccinations. Wife described what they needs and it sounds like she is requesting something off [place].

## 2019-09-06 ENCOUNTER — Other Ambulatory Visit: Payer: Self-pay | Admitting: Family Medicine

## 2019-10-08 ENCOUNTER — Encounter: Payer: Self-pay | Admitting: Family Medicine

## 2019-10-08 ENCOUNTER — Other Ambulatory Visit: Payer: Self-pay | Admitting: Family Medicine

## 2019-10-08 MED ORDER — METFORMIN HCL ER 500 MG PO TB24
1000.0000 mg | ORAL_TABLET | Freq: Two times a day (BID) | ORAL | 2 refills | Status: DC
Start: 2019-10-08 — End: 2019-11-06

## 2019-10-09 MED ORDER — ESOMEPRAZOLE MAGNESIUM 40 MG PO CPDR: DELAYED_RELEASE_CAPSULE | ORAL | 3 refills | Status: DC

## 2019-10-14 ENCOUNTER — Encounter: Payer: Self-pay | Admitting: Family Medicine

## 2019-10-16 NOTE — Telephone Encounter (Signed)
FYI

## 2019-10-31 ENCOUNTER — Ambulatory Visit: Payer: Commercial Managed Care - PPO | Admitting: Family Medicine

## 2019-11-06 ENCOUNTER — Other Ambulatory Visit: Payer: Self-pay

## 2019-11-06 ENCOUNTER — Encounter: Payer: Self-pay | Admitting: Family Medicine

## 2019-11-06 ENCOUNTER — Ambulatory Visit (INDEPENDENT_AMBULATORY_CARE_PROVIDER_SITE_OTHER): Payer: Commercial Managed Care - PPO | Admitting: Family Medicine

## 2019-11-06 VITALS — BP 144/80 | HR 85 | Temp 98.2°F | Ht 67.0 in | Wt 194.4 lb

## 2019-11-06 DIAGNOSIS — I152 Hypertension secondary to endocrine disorders: Secondary | ICD-10-CM

## 2019-11-06 DIAGNOSIS — E1065 Type 1 diabetes mellitus with hyperglycemia: Secondary | ICD-10-CM | POA: Diagnosis not present

## 2019-11-06 DIAGNOSIS — Z23 Encounter for immunization: Secondary | ICD-10-CM

## 2019-11-06 DIAGNOSIS — E1159 Type 2 diabetes mellitus with other circulatory complications: Secondary | ICD-10-CM

## 2019-11-06 LAB — POCT GLYCOSYLATED HEMOGLOBIN (HGB A1C): Hemoglobin A1C: 6.7 % — AB (ref 4.0–5.6)

## 2019-11-06 MED ORDER — METFORMIN HCL ER 500 MG PO TB24
1000.0000 mg | ORAL_TABLET | Freq: Two times a day (BID) | ORAL | 3 refills | Status: DC
Start: 2019-11-06 — End: 2020-11-03

## 2019-11-06 MED ORDER — IRBESARTAN 300 MG PO TABS: 300.0000 mg | ORAL_TABLET | Freq: Every day | ORAL | 3 refills | Status: DC

## 2019-11-06 NOTE — Assessment & Plan Note (Addendum)
Above goal but has not been on irbesartan for the past 3 days due to running out of his prescription.  Will resend it today.  Previously well controlled.  Recheck at next office visit.

## 2019-11-06 NOTE — Patient Instructions (Signed)
It was very nice to see you today!  Please decrease your lantus to 18 units daily.  No other changes today.  We will give your flu vaccine.  Please come back to see me in 3 to 6 months.  Please let me know if you are still having lows in the middle of the night.  Take care, Dr [name]  Please try these tips to maintain a healthy lifestyle:   Eat at least 3 REAL meals and 1-2 snacks per day.  Aim for no more than 5 hours between eating.  If you eat breakfast, please do so within one hour of getting up.    Each meal should contain half fruits/vegetables, one quarter protein, and one quarter carbs (no bigger than a computer mouse)   Cut down on sweet beverages. This includes juice, soda, and sweet tea.     Drink at least 1 glass of water with each meal and aim for at least 8 glasses per day   Exercise at least 150 minutes every week.

## 2019-11-06 NOTE — Assessment & Plan Note (Signed)
He is having some symptomatic lows.  Will decrease Lantus to 18 units daily.  Continue NovoLog with meals and Metformin 1000 mg twice daily.  He will let me know if lows continue and we will further down titrate Lantus or possibly switch to Levemir.  Follow-up in 3 to 6 months to recheck A1c.

## 2019-11-06 NOTE — Progress Notes (Signed)
   [name] is a 54 y.o. male who presents today for an office visit.  Assessment/Plan:  Chronic Problems Addressed Today: Hypertension associated with diabetes ([place]) Above goal but has not been on irbesartan for the past 3 days due to running out of his prescription.  Will resend it today.  Previously well controlled.  Recheck at next office visit.  Diabetes mellitus type 1 ([place]) He is having some symptomatic lows.  Will decrease Lantus to 18 units daily.  Continue NovoLog with meals and Metformin 1000 mg twice daily.  He will let me know if lows continue and we will further down titrate Lantus or possibly switch to Levemir.  Follow-up in 3 to 6 months to recheck A1c.  Flu vaccine given today.     Subjective:  HPI:  Patient here for diabetes follow-up.  He is compliant with prescribed medications.  He has had several episodes of low blood sugars into the 30s.  This usually occurs in the low the night.  Symptoms will be symptomatic.  Had to call EMS a few times.  He has been taking NovoLog with meals.  Is been taking Lantus 20 units daily in the morning.  Is also taking Metformin 1000 mg twice daily.       Objective:  Physical Exam: BP (!) 144/80   Pulse 85   Temp 98.2 F (36.8 C) (Temporal)   Ht 5\' 7"  (1.702 m)   Wt 194 lb 6.4 oz (88.2 kg)   SpO2 98%   BMI 30.45 kg/m   Gen: No acute distress, resting comfortably CV: Regular rate and rhythm with no murmurs appreciated Pulm: Normal work of breathing, clear to auscultation bilaterally with no crackles, wheezes, or rhonchi Neuro: Grossly normal, moves all extremities Psych: Normal affect and thought content      Latyra Jaye M. [name], MD 11/06/2019 12:39 PM

## 2019-12-29 ENCOUNTER — Encounter: Payer: Self-pay | Admitting: Family Medicine

## 2019-12-29 ENCOUNTER — Other Ambulatory Visit: Payer: Self-pay | Admitting: Family Medicine

## 2019-12-30 MED ORDER — IPRATROPIUM BROMIDE 0.06 % NA SOLN: NASAL | 0 refills | Status: DC

## 2019-12-30 NOTE — Telephone Encounter (Signed)
Please advise 

## 2020-01-01 ENCOUNTER — Other Ambulatory Visit: Payer: Commercial Managed Care - PPO

## 2020-01-01 DIAGNOSIS — Z20822 Contact with and (suspected) exposure to covid-19: Secondary | ICD-10-CM

## 2020-01-03 LAB — SARS-COV-2, NAA 2 DAY TAT

## 2020-01-03 LAB — NOVEL CORONAVIRUS, NAA: SARS-CoV-2, NAA: DETECTED — AB

## 2020-01-04 ENCOUNTER — Telehealth: Payer: Self-pay | Admitting: Nurse Practitioner

## 2020-01-04 NOTE — Telephone Encounter (Signed)
Called to Discuss with patient about Covid symptoms and the use of the monoclonal antibody infusion for those with mild to moderate Covid symptoms and at a high risk of hospitalization.     Pt appears to qualify for this infusion due to co-morbid conditions and/or a member of an at-risk group in accordance with the FDA Emergency Use Authorization. (BMI 30, htn, and diabetes)   Unable to reach pt. Voicemail left and My Chart message sent.   Alda Lea, NP [facility]  [phone]

## 2020-01-28 ENCOUNTER — Other Ambulatory Visit: Payer: Self-pay | Admitting: Family Medicine

## 2020-01-30 ENCOUNTER — Ambulatory Visit: Payer: Commercial Managed Care - PPO

## 2020-02-03 ENCOUNTER — Ambulatory Visit: Payer: Commercial Managed Care - PPO

## 2020-02-04 ENCOUNTER — Ambulatory Visit: Payer: Commercial Managed Care - PPO | Admitting: Family Medicine

## 2020-02-04 DIAGNOSIS — Z0289 Encounter for other administrative examinations: Secondary | ICD-10-CM

## 2020-03-31 ENCOUNTER — Encounter: Payer: Self-pay | Admitting: Family Medicine

## 2020-08-28 ENCOUNTER — Other Ambulatory Visit: Payer: Self-pay | Admitting: Family Medicine

## 2020-09-15 ENCOUNTER — Other Ambulatory Visit: Payer: Self-pay | Admitting: Family Medicine

## 2020-09-26 ENCOUNTER — Other Ambulatory Visit: Payer: Self-pay | Admitting: Family Medicine

## 2020-09-26 ENCOUNTER — Encounter: Payer: Self-pay | Admitting: Family Medicine

## 2020-09-27 ENCOUNTER — Telehealth: Payer: Self-pay | Admitting: *Deleted

## 2020-09-27 MED ORDER — IPRATROPIUM BROMIDE 0.06 % NA SOLN: NASAL | 0 refills | Status: DC

## 2020-09-27 NOTE — Telephone Encounter (Signed)
Pharmacy requesting medication changes Rx esomeprazole mag '40mg'$  to be change due to insurance preference to any of the followings  Rx omeprazole '40mg'$  cap #30 Rx Pantoprazole '40mg'$  tab #30 Rx Lansoprazole '30mg'$  Cap #90  Please advise

## 2020-09-28 MED ORDER — PANTOPRAZOLE SODIUM 40 MG PO TBEC: 40.0000 mg | DELAYED_RELEASE_TABLET | Freq: Every day | ORAL | 3 refills | Status: DC

## 2020-09-28 NOTE — Telephone Encounter (Signed)
Ok to send in pantoprazole '40mg'$  once daily dispense #30. Please ask patient to schedule  a follow up visit.   [name]. [name], MD 09/28/2020 7:56 AM

## 2020-09-28 NOTE — Telephone Encounter (Signed)
LVM to scheduled patient

## 2020-09-28 NOTE — Telephone Encounter (Signed)
Please schedule pt for a f/u visit per Dr. [name].  Pantoprazole has been sent to the pharmacy.

## 2020-10-28 ENCOUNTER — Other Ambulatory Visit: Payer: Self-pay

## 2020-10-28 ENCOUNTER — Ambulatory Visit (INDEPENDENT_AMBULATORY_CARE_PROVIDER_SITE_OTHER): Payer: Commercial Managed Care - PPO | Admitting: Family Medicine

## 2020-10-28 ENCOUNTER — Encounter: Payer: Self-pay | Admitting: Family Medicine

## 2020-10-28 VITALS — BP 156/84 | HR 83 | Temp 98.7°F | Ht 67.0 in | Wt 184.0 lb

## 2020-10-28 DIAGNOSIS — F17210 Nicotine dependence, cigarettes, uncomplicated: Secondary | ICD-10-CM

## 2020-10-28 DIAGNOSIS — Z23 Encounter for immunization: Secondary | ICD-10-CM | POA: Diagnosis not present

## 2020-10-28 DIAGNOSIS — K219 Gastro-esophageal reflux disease without esophagitis: Secondary | ICD-10-CM | POA: Diagnosis not present

## 2020-10-28 DIAGNOSIS — E1069 Type 1 diabetes mellitus with other specified complication: Secondary | ICD-10-CM

## 2020-10-28 DIAGNOSIS — E1065 Type 1 diabetes mellitus with hyperglycemia: Secondary | ICD-10-CM | POA: Diagnosis not present

## 2020-10-28 DIAGNOSIS — E785 Hyperlipidemia, unspecified: Secondary | ICD-10-CM

## 2020-10-28 DIAGNOSIS — I152 Hypertension secondary to endocrine disorders: Secondary | ICD-10-CM

## 2020-10-28 DIAGNOSIS — E1159 Type 2 diabetes mellitus with other circulatory complications: Secondary | ICD-10-CM | POA: Diagnosis not present

## 2020-10-28 DIAGNOSIS — F17200 Nicotine dependence, unspecified, uncomplicated: Secondary | ICD-10-CM | POA: Insufficient documentation

## 2020-10-28 LAB — COMPREHENSIVE METABOLIC PANEL
ALT: 24 U/L (ref 0–53)
AST: 25 U/L (ref 0–37)
Albumin: 4.1 g/dL (ref 3.5–5.2)
Alkaline Phosphatase: 110 U/L (ref 39–117)
BUN: 15 mg/dL (ref 6–23)
CO2: 25 mEq/L (ref 19–32)
Calcium: 9.3 mg/dL (ref 8.4–10.5)
Chloride: 104 mEq/L (ref 96–112)
Creatinine, Ser: 1.08 mg/dL (ref 0.40–1.50)
GFR: 77.79 mL/min (ref >60.00)
Glucose, Bld: 89 mg/dL (ref 70–99)
Potassium: 5.6 mEq/L — ABNORMAL HIGH (ref 3.5–5.1)
Sodium: 136 mEq/L (ref 135–145)
Total Bilirubin: 0.2 mg/dL (ref 0.2–1.2)
Total Protein: 7.2 g/dL (ref 6.0–8.3)

## 2020-10-28 LAB — CBC
HCT: 43.7 % (ref 39.0–52.0)
Hemoglobin: 14.7 g/dL (ref 13.0–17.0)
MCHC: 33.7 g/dL (ref 30.0–36.0)
MCV: 96.6 fl (ref 78.0–100.0)
Platelets: 372 10*3/uL (ref 150.0–400.0)
RBC: 4.52 Mil/uL (ref 4.22–5.81)
RDW: 13.1 % (ref 11.5–15.5)
WBC: 13.4 10*3/uL — ABNORMAL HIGH (ref 4.0–10.5)

## 2020-10-28 LAB — POCT GLYCOSYLATED HEMOGLOBIN (HGB A1C): Hemoglobin A1C: 7.7 % — AB (ref 4.0–5.6)

## 2020-10-28 LAB — TSH: TSH: 1.65 u[IU]/mL (ref 0.35–5.50)

## 2020-10-28 LAB — LIPID PANEL
Cholesterol: 241 mg/dL — ABNORMAL HIGH (ref 0–200)
HDL: 90.5 mg/dL (ref >39.00)
LDL Cholesterol: 135 mg/dL — ABNORMAL HIGH (ref 0–99)
NonHDL: 150.54
Total CHOL/HDL Ratio: 3
Triglycerides: 77 mg/dL (ref 0.0–149.0)
VLDL: 15.4 mg/dL (ref 0.0–40.0)

## 2020-10-28 MED ORDER — ESOMEPRAZOLE MAGNESIUM 40 MG PO CPDR: 40.0000 mg | DELAYED_RELEASE_CAPSULE | Freq: Every day | ORAL | 0 refills | Status: DC

## 2020-10-28 MED ORDER — INSULIN GLARGINE-YFGN 100 UNIT/ML ~~LOC~~ SOPN: 20.0000 [IU] | PEN_INJECTOR | Freq: Every day | SUBCUTANEOUS | 3 refills | Status: DC

## 2020-10-28 NOTE — Patient Instructions (Signed)
It was very nice to see you today!  Your A1c is 7.7.  I will send in the new insulin for you.    We will check blood work today.  We will give your flu shot today.  We will see back in 3 to 6 months.  Please come back sooner if needed.   Take care, Dr [name]  PLEASE NOTE:  If you had any lab tests please let us know if you have not heard back within a few days. You may see your results on mychart before we have a chance to review them but we will give you a call once they are reviewed by [place]. If we ordered any referrals today, please let us know if you have not heard from their office within the next week.   Please try these tips to maintain a healthy lifestyle:  Eat at least 3 REAL meals and 1-2 snacks per day.  Aim for no more than 5 hours between eating.  If you eat breakfast, please do so within one hour of getting up.   Each meal should contain half fruits/vegetables, one quarter protein, and one quarter carbs (no bigger than a computer mouse)  Cut down on sweet beverages. This includes juice, soda, and sweet tea.   Drink at least 1 glass of water with each meal and aim for at least 8 glasses per day  Exercise at least 150 minutes every week.

## 2020-10-28 NOTE — Progress Notes (Signed)
   [name] is a 55 y.o. male who presents today for an office visit.  Assessment/Plan:  Chronic Problems Addressed Today: Diabetes mellitus type 1 (HCC) A1c 7.7.  His insurance is no longer covering Lantus.  We will send in [facility] as an alternative. Continue at 20 units daily.  We will also continue metformin 1000 milligrams twice daily and NovoLog with meals.  Follow-up in 3 to 6 months to recheck A1c.  Nicotine dependence with current use Patient was asked about his tobacco use today and was strongly advised to quit. Patient is currently contemplative. We reviewed treatment options to assist him quit smoking including NRT, Chantix, and Bupropion. Follow up at next office visit.   Total time spent counseling approximately 3 minutes.    Hypertension associated with diabetes ([name]) Slightly above goal.  Typically well controlled.  We will continue current dose of irbesartan 300 mg daily and he will continue home monitoring with goal 140/90 or lower.  Follow-up in 3 to 6 months.  He will let me know if persistently elevated at home.  Gastroesophageal reflux disease without esophagitis Stable on nexium 40mg  daily.   Hyperlipidemia due to T1DM [name]) - Statin Intolerant Check labs.  Flu shot given today.     Subjective:  HPI:  He is here for diabetes follow up. He was last seen in the office on 11/06/2019.  His blood sugar at home is usually in the 110's. He reports fasting sugar is stable as well. He would like to switch his insulin  Lantus Sol[employer] to [employer]. He reports his insurance no longer covers the Lantus.  See A/p for status of chronic conditions.        Objective:  Physical Exam: BP (!) 156/84   Pulse 83   Temp 98.7 F (37.1 C) (Temporal)   Ht 5\' 7"  (1.702 m)   Wt 184 lb (83.5 kg)   SpO2 98%   BMI 28.82 kg/m   Gen: No acute distress, resting comfortably CV: Regular rate and rhythm with no murmurs appreciated Pulm: Normal work of breathing, clear to  auscultation bilaterally with no crackles, wheezes, or rhonchi Neuro: Grossly normal, moves all extremities Psych: Normal affect and thought content       I,Savera Zaman,acting as a scribe for Dimas Chyle, MD.,have documented all relevant documentation on the behalf of Dimas Chyle, MD,as directed by  Dimas Chyle, MD while in the presence of Dimas Chyle, MD.   I, Dimas Chyle, MD, have reviewed all documentation for this visit. The documentation on 10/28/20 for the exam, diagnosis, procedures, and orders are all accurate and complete.  [name]. [name], MD 10/28/2020 9:36 AM

## 2020-10-28 NOTE — Assessment & Plan Note (Signed)
Patient was asked about his tobacco use today and was strongly advised to quit. Patient is currently contemplative. We reviewed treatment options to assist him quit smoking including NRT, Chantix, and Bupropion. Follow up at next office visit.   Total time spent counseling approximately 3 minutes.   

## 2020-10-28 NOTE — Assessment & Plan Note (Signed)
Stable on nexium 40mg  daily.

## 2020-10-28 NOTE — Assessment & Plan Note (Signed)
A1c 7.7.  His insurance is no longer covering Lantus.  We will send in [name] as an alternative. Continue at 20 units daily.  We will also continue metformin 1000 milligrams twice daily and NovoLog with meals.  Follow-up in 3 to 6 months to recheck A1c.

## 2020-10-28 NOTE — Assessment & Plan Note (Signed)
Slightly above goal.  Typically well controlled.  We will continue current dose of irbesartan 300 mg daily and he will continue home monitoring with goal 140/90 or lower.  Follow-up in 3 to 6 months.  He will let me know if persistently elevated at home.

## 2020-10-28 NOTE — Assessment & Plan Note (Signed)
Check labs 

## 2020-10-29 NOTE — Progress Notes (Signed)
Please inform patient of the following:  His potassium is high. Would like for him to come back in 1-2 weeks to recheck.   His cholesterol levels are high. I know he has not been able to tolerate statins in the past but we need to work on lowering his cholesterol levels to reduce his risk of heart attack and stroke. Recommend referral to lipid clinic with cardiology if he is interested.   His WBC is also high. We can recheck when he comes back to recheck his potassium. Please place future order for BMET and CBC with differential.  Ikhlas Albo M. [name], MD 10/29/2020 9:29 AM

## 2020-11-03 ENCOUNTER — Other Ambulatory Visit: Payer: Self-pay | Admitting: Family Medicine

## 2020-11-03 DIAGNOSIS — E1065 Type 1 diabetes mellitus with hyperglycemia: Secondary | ICD-10-CM

## 2020-12-23 ENCOUNTER — Other Ambulatory Visit: Payer: Self-pay | Admitting: Family Medicine

## 2021-01-26 ENCOUNTER — Other Ambulatory Visit: Payer: Self-pay | Admitting: *Deleted

## 2021-01-26 MED ORDER — ESOMEPRAZOLE MAGNESIUM 40 MG PO CPDR: DELAYED_RELEASE_CAPSULE | ORAL | 0 refills | Status: DC

## 2021-01-26 MED ORDER — INSULIN LISPRO (1 UNIT DIAL) 100 UNIT/ML (KWIKPEN): PEN_INJECTOR | SUBCUTANEOUS | 0 refills | Status: DC

## 2021-01-27 ENCOUNTER — Other Ambulatory Visit: Payer: Self-pay | Admitting: Family Medicine

## 2021-01-27 DIAGNOSIS — E1065 Type 1 diabetes mellitus with hyperglycemia: Secondary | ICD-10-CM

## 2021-02-02 ENCOUNTER — Ambulatory Visit (INDEPENDENT_AMBULATORY_CARE_PROVIDER_SITE_OTHER): Payer: Commercial Managed Care - PPO | Admitting: Family Medicine

## 2021-02-02 ENCOUNTER — Encounter: Payer: Self-pay | Admitting: Family Medicine

## 2021-02-02 ENCOUNTER — Other Ambulatory Visit: Payer: Self-pay

## 2021-02-02 VITALS — BP 150/92 | HR 78 | Temp 98.3°F | Ht 70.0 in | Wt 184.0 lb

## 2021-02-02 DIAGNOSIS — E1159 Type 2 diabetes mellitus with other circulatory complications: Secondary | ICD-10-CM

## 2021-02-02 DIAGNOSIS — E1065 Type 1 diabetes mellitus with hyperglycemia: Secondary | ICD-10-CM | POA: Diagnosis not present

## 2021-02-02 DIAGNOSIS — L723 Sebaceous cyst: Secondary | ICD-10-CM

## 2021-02-02 DIAGNOSIS — R5383 Other fatigue: Secondary | ICD-10-CM | POA: Diagnosis not present

## 2021-02-02 DIAGNOSIS — E785 Hyperlipidemia, unspecified: Secondary | ICD-10-CM

## 2021-02-02 DIAGNOSIS — N529 Male erectile dysfunction, unspecified: Secondary | ICD-10-CM | POA: Diagnosis not present

## 2021-02-02 DIAGNOSIS — I152 Hypertension secondary to endocrine disorders: Secondary | ICD-10-CM

## 2021-02-02 DIAGNOSIS — E1069 Type 1 diabetes mellitus with other specified complication: Secondary | ICD-10-CM | POA: Diagnosis not present

## 2021-02-02 DIAGNOSIS — K219 Gastro-esophageal reflux disease without esophagitis: Secondary | ICD-10-CM

## 2021-02-02 LAB — CBC
HCT: 42.4 % (ref 39.0–52.0)
Hemoglobin: 14 g/dL (ref 13.0–17.0)
MCHC: 32.9 g/dL (ref 30.0–36.0)
MCV: 97.8 fl (ref 78.0–100.0)
Platelets: 320 10*3/uL (ref 150.0–400.0)
RBC: 4.34 Mil/uL (ref 4.22–5.81)
RDW: 13.6 % (ref 11.5–15.5)
WBC: 10.5 10*3/uL (ref 4.0–10.5)

## 2021-02-02 LAB — COMPREHENSIVE METABOLIC PANEL
ALT: 18 U/L (ref 0–53)
AST: 23 U/L (ref 0–37)
Albumin: 4.1 g/dL (ref 3.5–5.2)
Alkaline Phosphatase: 111 U/L (ref 39–117)
BUN: 12 mg/dL (ref 6–23)
CO2: 23 mEq/L (ref 19–32)
Calcium: 9 mg/dL (ref 8.4–10.5)
Chloride: 102 mEq/L (ref 96–112)
Creatinine, Ser: 1 mg/dL (ref 0.40–1.50)
GFR: 85.15 mL/min (ref >60.00)
Glucose, Bld: 72 mg/dL (ref 70–99)
Potassium: 5.1 mEq/L (ref 3.5–5.1)
Sodium: 135 mEq/L (ref 135–145)
Total Bilirubin: 0.2 mg/dL (ref 0.2–1.2)
Total Protein: 7.3 g/dL (ref 6.0–8.3)

## 2021-02-02 LAB — VITAMIN B12: Vitamin B-12: 324 pg/mL (ref 211–911)

## 2021-02-02 LAB — HEMOGLOBIN A1C: Hgb A1c MFr Bld: 8.7 % — ABNORMAL HIGH (ref 4.6–6.5)

## 2021-02-02 LAB — TSH: TSH: 1.78 u[IU]/mL (ref 0.35–5.50)

## 2021-02-02 LAB — VITAMIN D 25 HYDROXY (VIT D DEFICIENCY, FRACTURES): VITD: 14.4 ng/mL — ABNORMAL LOW (ref 30.00–100.00)

## 2021-02-02 MED ORDER — BD PEN NEEDLE NANO U/F 32G X 4 MM MISC: 0 refills | Status: DC

## 2021-02-02 MED ORDER — INSULIN GLARGINE-YFGN 100 UNIT/ML ~~LOC~~ SOPN: 20.0000 [IU] | PEN_INJECTOR | Freq: Every day | SUBCUTANEOUS | 3 refills | Status: DC

## 2021-02-02 MED ORDER — ESOMEPRAZOLE MAGNESIUM 40 MG PO CPDR: DELAYED_RELEASE_CAPSULE | ORAL | 3 refills | Status: DC

## 2021-02-02 MED ORDER — METFORMIN HCL ER 500 MG PO TB24: 1000.0000 mg | ORAL_TABLET | Freq: Two times a day (BID) | ORAL | 3 refills | Status: DC

## 2021-02-02 MED ORDER — IRBESARTAN 300 MG PO TABS: 300.0000 mg | ORAL_TABLET | Freq: Every day | ORAL | 3 refills | Status: DC

## 2021-02-02 MED ORDER — INSULIN LISPRO (1 UNIT DIAL) 100 UNIT/ML (KWIKPEN): PEN_INJECTOR | SUBCUTANEOUS | 0 refills | Status: DC

## 2021-02-02 NOTE — Assessment & Plan Note (Signed)
On Nexium 40 mg daily.  We will check B12 today.

## 2021-02-02 NOTE — Assessment & Plan Note (Signed)
Check A1c.  He is on Semglee 20 units daily, metformin 1000 mg twice daily, NovoLog with meals.

## 2021-02-02 NOTE — Patient Instructions (Signed)
It was very nice to see you today!  We will check blood work today.  We remove the cyst in your back.  Please keep this bandage for the next couple of days.  Let us know if you have any signs of infection.  If your blood work is normal we can consider getting a sleep study done to look for any other causes of fatigue.  We will see back in 3 to 6 months depending on the results of your blood work.  Take care, Dr [name]  PLEASE NOTE:  If you had any lab tests please let us know if you have not heard back within a few days. You may see your results on mychart before we have a chance to review them but we will give you a call once they are reviewed by [place]. If we ordered any referrals today, please let us know if you have not heard from their office within the next week.   Please try these tips to maintain a healthy lifestyle:  Eat at least 3 REAL meals and 1-2 snacks per day.  Aim for no more than 5 hours between eating.  If you eat breakfast, please do so within one hour of getting up.   Each meal should contain half fruits/vegetables, one quarter protein, and one quarter carbs (no bigger than a computer mouse)  Cut down on sweet beverages. This includes juice, soda, and sweet tea.   Drink at least 1 glass of water with each meal and aim for at least 8 glasses per day  Exercise at least 150 minutes every week.

## 2021-02-02 NOTE — Assessment & Plan Note (Signed)
Stable.  Sildenafil refilled.

## 2021-02-02 NOTE — Assessment & Plan Note (Signed)
Cannot tolerate statins 

## 2021-02-02 NOTE — Progress Notes (Signed)
[name] is a 56 y.o. male who presents today for an office visit.  Assessment/Plan:  New/Acute Problems: Inflamed sebaceous cyst Removed today.  See below procedure note.  He tolerated well.  Other fatigue Likely multifactorial.  We will check labs today.  Concerned that his sugars may be getting too low.  May consider referral to sleep study if labs are normal.  Chronic Problems Addressed Today: Erectile dysfunction Stable.  Sildenafil refilled.  Hypertension associated with diabetes ([place]) Slightly above goal though typically well controlled at home.  Continue irbesartan 300 mg daily.  He will continue home monitoring.  He will let us know if persistently elevated.  Follow-up in 3 to 6 months.  Gastroesophageal reflux disease without esophagitis On Nexium 40 mg daily.  We will check B12 today.  Hyperlipidemia due to T1DM ([place]) - Statin Intolerant Cannot tolerate statins  Diabetes mellitus type 1 (HCC) Check A1c.  He is on Semglee 20 units daily, metformin 1000 mg twice daily, NovoLog with meals.     Subjective:  HPI:  See A/P for status of chronic conditions.  Patient is here with concerns for fatigue.  Started 3 to 4 months ago.  He wakes up feeling refreshed however by the middle of the day feels extremely tired and is unable to work.  He has been compliant with his medications.  Sugars have been in the 70s to 200s.  No symptomatic lows.  Home blood pressure readings have been at goal.  No recent illnesses.  No fevers or chills.  No obvious blood loss.  Wakes up 3-4 times a night due to inability to sleep.  No witnessed apneic episodes.  He also has a cyst in the middle of his back that becomes intermittently inflamed.  Is been present for about a year.  His wife has tried popping it at home without significant improvement.  He would like to have it removed today.       Objective:  Physical Exam: BP (!) 150/92    Pulse 78    Temp 98.3 F (36.8 C) (Temporal)    Ht  5\' 10"  (1.778 m)    Wt 184 lb (83.5 kg)    SpO2 98%    BMI 26.40 kg/m   Gen: No acute distress, resting comfortably CV: Regular rate and rhythm with no murmurs appreciated Pulm: Normal work of breathing, clear to auscultation bilaterally with no crackles, wheezes, or rhonchi Skin: Inflamed sebaceous cyst approximately 1 cm in diameter along midline lower back. Neuro: Grossly normal, moves all extremities Psych: Normal affect and thought content   Sebaceous Cyst I&D with Excision Procedure Note  Pre-operative Diagnosis: Inflamed sebaceous cyst  Post-operative Diagnosis: Same  Locations: Back  Indications: Diagnostic and therapeutic  Anesthesia: Lidocaine 1% with epinephrine without added sodium bicarbonate  Procedure Details  History of allergy to iodine: No  Patient informed of the risks (including bleeding and infection) and benefits of the  procedure and Written informed consent obtained.  The lesion and surrounding area was given a sterile prep using betadyne and draped in the usual sterile fashion.  An 11 blade scalpel was used to make an approximately 68mm incision.  Typical sebaceous material was then expressed from the incision site.  After successful expression of material, a hemostat was then inserted into the incision to break up any loculations. A hemostat was then used to gently remove the cyst wall from the incision.  Antibiotic ointment and a sterile dressing applied.  The specimen was  not sent for pathologic examination. The patient tolerated the procedure well.  EBL: 1 ml  Findings:  Inflamed Sebaceous cyst  Condition: Stable  Complications: none.  Plan: 1. Instructed to keep the wound dry and covered for 24-48h and clean thereafter. 2. Warning signs of infection were reviewed.   3. Recommended that the patient use OTC analgesics as needed for pain.       [name]. [name], MD 02/02/2021 10:48 AM

## 2021-02-02 NOTE — Assessment & Plan Note (Signed)
Slightly above goal though typically well controlled at home.  Continue irbesartan 300 mg daily.  He will continue home monitoring.  He will let us know if persistently elevated.  Follow-up in 3 to 6 months.

## 2021-02-03 NOTE — Progress Notes (Signed)
Please inform patient of the following:  His vitamin D is low and his A1c is elevated. This could explain some his symptoms.   Recommend starting vitamin D 50000IU once weekly. We can recheck in 3-6 months.   We need to get his A1c under better control. Recommend increasing his basal insulin to 22U daily. We should recheck his A1c in 3 months but I would like for him to send [place] a message in a few weeks to let us know how is sugars are looking.  [name]. [name], MD 02/03/2021 1:05 PM

## 2021-02-07 ENCOUNTER — Other Ambulatory Visit: Payer: Self-pay | Admitting: *Deleted

## 2021-02-07 ENCOUNTER — Telehealth: Payer: Self-pay

## 2021-02-07 MED ORDER — VITAMIN D (ERGOCALCIFEROL) 1.25 MG (50000 UNIT) PO CAPS: 50000.0000 [IU] | ORAL_CAPSULE | ORAL | 0 refills | Status: DC

## 2021-02-07 NOTE — Telephone Encounter (Signed)
See results note. 

## 2021-02-07 NOTE — Telephone Encounter (Signed)
Patient calling back to get lab results.  Please follow up in regard.    States he is in and out of service where he is located.  I informed patient to try [place] back again later as well.

## 2021-03-02 ENCOUNTER — Ambulatory Visit: Payer: Commercial Managed Care - PPO | Admitting: Family Medicine

## 2021-04-25 ENCOUNTER — Ambulatory Visit (INDEPENDENT_AMBULATORY_CARE_PROVIDER_SITE_OTHER): Payer: Commercial Managed Care - PPO | Admitting: Family Medicine

## 2021-04-25 VITALS — BP 162/102 | HR 70 | Temp 98.1°F | Ht 70.0 in | Wt 182.8 lb

## 2021-04-25 DIAGNOSIS — I152 Hypertension secondary to endocrine disorders: Secondary | ICD-10-CM | POA: Diagnosis not present

## 2021-04-25 DIAGNOSIS — E1159 Type 2 diabetes mellitus with other circulatory complications: Secondary | ICD-10-CM

## 2021-04-25 DIAGNOSIS — E559 Vitamin D deficiency, unspecified: Secondary | ICD-10-CM | POA: Diagnosis not present

## 2021-04-25 DIAGNOSIS — E1065 Type 1 diabetes mellitus with hyperglycemia: Secondary | ICD-10-CM

## 2021-04-25 LAB — VITAMIN D 25 HYDROXY (VIT D DEFICIENCY, FRACTURES): VITD: 22.07 ng/mL — ABNORMAL LOW (ref 30.00–100.00)

## 2021-04-25 LAB — POCT GLYCOSYLATED HEMOGLOBIN (HGB A1C): Hemoglobin A1C: 7.1 % — AB (ref 4.0–5.6)

## 2021-04-25 MED ORDER — INSULIN GLARGINE-YFGN 100 UNIT/ML ~~LOC~~ SOPN: 22.0000 [IU] | PEN_INJECTOR | Freq: Every day | SUBCUTANEOUS | 3 refills | Status: DC

## 2021-04-25 MED ORDER — BLOOD PRESSURE KIT: PACK | 0 refills | Status: DC

## 2021-04-25 NOTE — Progress Notes (Signed)
? ?  [name] is a 56 y.o. male who presents today for an office visit. ? ?Assessment/Plan:  ?Chronic Problems Addressed Today: ?Vitamin D deficiency ?He has been on vitamin D 50,000 IUs weekly for the last 12 weeks.  We will recheck vitamin D level today.  We will likely be able to continue with 1000 IUs daily as maintenance depending on results. ? ?Diabetes mellitus type 1 ([place]) ?A1c well controlled at 7.1.  We will continue Semglee 22 units daily, metformin 5 mg twice daily, and NovoLog with meals.  Follow-up in 3 to 6 months. ? ?Hypertension associated with diabetes ([place]) ?Above goal.  He has typically been well controlled.  We will continue irbesartan 300 mg daily.  We discussed home monitoring.  He will check at home over the next couple weeks and send me a my message via [place].  If continues to be elevated we will likely add on amlodipine versus HCTZ. ? ? ?  ?Subjective:  ?HPI: ? ?See A/p for status of chronic conditions.   ? ?   ?  ?Objective:  ?Physical Exam: ?BP (!) 162/102 (BP Location: Left Arm)   Pulse 70   Temp 98.1 ?F (36.7 ?C) (Temporal)   Ht '5\' 10"'$  (1.778 m)   Wt 182 lb 12.8 oz (82.9 kg)   SpO2 99%   BMI 26.23 kg/m?   ?Wt Readings from Last 3 Encounters:  ?04/25/21 182 lb 12.8 oz (82.9 kg)  ?02/02/21 184 lb (83.5 kg)  ?10/28/20 184 lb (83.5 kg)  ?Gen: No acute distress, resting comfortably ?CV: Regular rate and rhythm with no murmurs appreciated ?Pulm: Normal work of breathing, clear to auscultation bilaterally with no crackles, wheezes, or rhonchi ?Neuro: Grossly normal, moves all extremities ?Psych: Normal affect and thought content ? ?   ? ?[name]. [name], MD ?04/25/2021 9:31 AM  ?

## 2021-04-25 NOTE — Assessment & Plan Note (Signed)
He has been on vitamin D 50,000 IUs weekly for the last 12 weeks.  We will recheck vitamin D level today.  We will likely be able to continue with 1000 IUs daily as maintenance depending on results. ?

## 2021-04-25 NOTE — Assessment & Plan Note (Signed)
Above goal.  He has typically been well controlled.  We will continue irbesartan 300 mg daily.  We discussed home monitoring.  He will check at home over the next couple weeks and send me a my message via [place].  If continues to be elevated we will likely add on amlodipine versus HCTZ. ?

## 2021-04-25 NOTE — Patient Instructions (Signed)
It was very nice to see you today! ? ?Your A1c is good.  Continue your current medications and continue to work on diet and exercise. ? ?We will check your vitamin D today. ? ?Please keep an eye on your blood pressure and let me know if it is persistently 140/90 or higher at home. ? ?I will see you back in 3 to 6 months.  Come back sooner if needed. ? ?Take care, ?Dr [name] ? ?PLEASE NOTE: ? ?If you had any lab tests please let us know if you have not heard back within a few days. You may see your results on mychart before we have a chance to review them but we will give you a call once they are reviewed by [place]. If we ordered any referrals today, please let us know if you have not heard from their office within the next week.  ? ?Please try these tips to maintain a healthy lifestyle: ? ?Eat at least 3 REAL meals and 1-2 snacks per day.  Aim for no more than 5 hours between eating.  If you eat breakfast, please do so within one hour of getting up.  ? ?Each meal should contain half fruits/vegetables, one quarter protein, and one quarter carbs (no bigger than a computer mouse) ? ?Cut down on sweet beverages. This includes juice, soda, and sweet tea.  ? ?Drink at least 1 glass of water with each meal and aim for at least 8 glasses per day ? ?Exercise at least 150 minutes every week.   ?

## 2021-04-25 NOTE — Assessment & Plan Note (Signed)
A1c well controlled at 7.1.  We will continue Semglee 22 units daily, metformin 5 mg twice daily, and NovoLog with meals.  Follow-up in 3 to 6 months. ?

## 2021-04-26 ENCOUNTER — Encounter: Payer: Self-pay | Admitting: Internal Medicine

## 2021-04-27 ENCOUNTER — Other Ambulatory Visit: Payer: Self-pay

## 2021-04-27 MED ORDER — VITAMIN D (ERGOCALCIFEROL) 1.25 MG (50000 UNIT) PO CAPS: 50000.0000 [IU] | ORAL_CAPSULE | ORAL | 1 refills | Status: DC

## 2021-04-27 NOTE — Progress Notes (Signed)
Please inform patient of the following: ? ?Vitamin D is better but still low. He should do another round of 50000IU weekly for 12 weeks and we can recheck in 3-6 months.  Please send in new rx if needed.  ? ?[name]. [name], MD ?04/27/2021 9:20 AM  ?

## 2021-06-02 ENCOUNTER — Other Ambulatory Visit: Payer: Self-pay

## 2021-06-02 ENCOUNTER — Encounter (HOSPITAL_COMMUNITY): Payer: Self-pay | Admitting: Emergency Medicine

## 2021-06-02 ENCOUNTER — Telehealth: Payer: Self-pay | Admitting: Nurse Practitioner

## 2021-06-02 ENCOUNTER — Emergency Department (HOSPITAL_COMMUNITY)
Admission: EM | Admit: 2021-06-02 | Discharge: 2021-06-02 | Disposition: A | Discharge status: Home / Self Care | Payer: Commercial Managed Care - PPO | Attending: Emergency Medicine | Admitting: Emergency Medicine

## 2021-06-02 ENCOUNTER — Emergency Department (HOSPITAL_COMMUNITY): Payer: Commercial Managed Care - PPO

## 2021-06-02 DIAGNOSIS — S0101XA Laceration without foreign body of scalp, initial encounter: Secondary | ICD-10-CM | POA: Insufficient documentation

## 2021-06-02 DIAGNOSIS — I10 Essential (primary) hypertension: Secondary | ICD-10-CM | POA: Insufficient documentation

## 2021-06-02 DIAGNOSIS — R55 Syncope and collapse: Secondary | ICD-10-CM | POA: Insufficient documentation

## 2021-06-02 DIAGNOSIS — W19XXXA Unspecified fall, initial encounter: Secondary | ICD-10-CM | POA: Insufficient documentation

## 2021-06-02 DIAGNOSIS — Z7984 Long term (current) use of oral hypoglycemic drugs: Secondary | ICD-10-CM | POA: Diagnosis not present

## 2021-06-02 DIAGNOSIS — Z794 Long term (current) use of insulin: Secondary | ICD-10-CM | POA: Diagnosis not present

## 2021-06-02 DIAGNOSIS — R4182 Altered mental status, unspecified: Secondary | ICD-10-CM | POA: Insufficient documentation

## 2021-06-02 DIAGNOSIS — E10649 Type 1 diabetes mellitus with hypoglycemia without coma: Secondary | ICD-10-CM | POA: Diagnosis not present

## 2021-06-02 DIAGNOSIS — Z79899 Other long term (current) drug therapy: Secondary | ICD-10-CM | POA: Diagnosis not present

## 2021-06-02 DIAGNOSIS — E162 Hypoglycemia, unspecified: Secondary | ICD-10-CM

## 2021-06-02 DIAGNOSIS — S0990XA Unspecified injury of head, initial encounter: Secondary | ICD-10-CM | POA: Diagnosis present

## 2021-06-02 LAB — CBC
HCT: 40.4 % (ref 39.0–52.0)
Hemoglobin: 13.8 g/dL (ref 13.0–17.0)
MCH: 32.6 pg (ref 26.0–34.0)
MCHC: 34.2 g/dL (ref 30.0–36.0)
MCV: 95.5 fL (ref 80.0–100.0)
Platelets: 313 10*3/uL (ref 150–400)
RBC: 4.23 MIL/uL (ref 4.22–5.81)
RDW: 12.4 % (ref 11.5–15.5)
WBC: 10.5 10*3/uL (ref 4.0–10.5)
nRBC: 0 % (ref 0.0–0.2)

## 2021-06-02 LAB — URINALYSIS, ROUTINE W REFLEX MICROSCOPIC
Bacteria, UA: NONE SEEN
Bilirubin Urine: NEGATIVE
Glucose, UA: 50 mg/dL — AB
Ketones, ur: NEGATIVE mg/dL
Leukocytes,Ua: NEGATIVE
Nitrite: NEGATIVE
Protein, ur: 100 mg/dL — AB
Specific Gravity, Urine: 1.005 (ref 1.005–1.030)
pH: 5 (ref 5.0–8.0)

## 2021-06-02 LAB — CBG MONITORING, ED
Glucose-Capillary: 198 mg/dL — ABNORMAL HIGH (ref 70–99)
Glucose-Capillary: 156 mg/dL — ABNORMAL HIGH (ref 70–99)
Glucose-Capillary: 297 mg/dL — ABNORMAL HIGH (ref 70–99)
Glucose-Capillary: 244 mg/dL — ABNORMAL HIGH (ref 70–99)

## 2021-06-02 LAB — BASIC METABOLIC PANEL
Anion gap: 12 (ref 5–15)
BUN: 15 mg/dL (ref 6–20)
CO2: 18 mmol/L — ABNORMAL LOW (ref 22–32)
Calcium: 8.7 mg/dL — ABNORMAL LOW (ref 8.9–10.3)
Chloride: 105 mmol/L (ref 98–111)
Creatinine, Ser: 1.03 mg/dL (ref 0.61–1.24)
GFR, Estimated: >60 mL/min (ref >60)
Glucose, Bld: 170 mg/dL — ABNORMAL HIGH (ref 70–99)
Potassium: 4.7 mmol/L (ref 3.5–5.1)
Sodium: 135 mmol/L (ref 135–145)

## 2021-06-02 LAB — LIPASE, BLOOD: Lipase: 31 U/L (ref 11–51)

## 2021-06-02 LAB — MAGNESIUM: Magnesium: 1.8 mg/dL (ref 1.7–2.4)

## 2021-06-02 MED ORDER — SODIUM CHLORIDE 0.9% FLUSH: 3.0000 mL | Freq: Two times a day (BID) | INTRAVENOUS | Status: DC

## 2021-06-02 MED ORDER — SODIUM CHLORIDE 0.9% FLUSH: 3.0000 mL | INTRAVENOUS | Status: DC | PRN

## 2021-06-02 MED ORDER — BACITRACIN ZINC 500 UNIT/GM EX OINT: TOPICAL_OINTMENT | Freq: Two times a day (BID) | CUTANEOUS | Status: DC

## 2021-06-02 NOTE — Discharge Instructions (Addendum)
You were seen in the emergency department today for low blood sugar.  I am urgently referring you to endocrinology.  They should call you within the next 48 hours.  If they do not I have placed the information in your discharge paperwork to give them a call.  Please return to the emergency department for any recurrent low blood sugars or concerns or for any seizure from low blood sugar or abnormal mental status.  Please do not drive alone or operate heavy machinery in the next week.  Additionally follow-up with your primary care physician on a regular schedule.

## 2021-06-02 NOTE — ED Provider Notes (Signed)
Care of patient handed off to me by Sponseller, PA-C at this time.  Please see her note for full work-up at this point.  Briefly, this is a 56 year old male with type 1 diabetes who presents to the emergency department with hypoglycemia.  Per the wife, she heard him get up this morning and fall.  She states that she went to check on him and he was unarousable.  At some point she was able to wake him up to have orange juice and she called EMS. EMS with initial BG 44. They gave him an amp of D50 with improvement in blood sugar to greater than 200.  At the time of his arrival he was somnolent but easily arousable to voice.  He does not recall the event. Physical Exam  BP (!) 176/99   Pulse 85   Temp 97.9 F (36.6 C) (Oral)   Resp 19   Ht '5\' 10"'$  (1.778 m)   Wt 82.9 kg   SpO2 97%   BMI 26.22 kg/m   Physical Exam Vitals and nursing note reviewed.  Constitutional:      Appearance: He is ill-appearing and diaphoretic.  HENT:     Head: Normocephalic.     Comments: Skin tear to the right parietal scalp    Mouth/Throat:     Mouth: Mucous membranes are moist.     Pharynx: Oropharynx is clear.  Eyes:     General: No scleral icterus.    Extraocular Movements: Extraocular movements intact.     Conjunctiva/sclera: Conjunctivae normal.     Pupils: Pupils are equal, round, and reactive to light.  Cardiovascular:     Rate and Rhythm: Normal rate and regular rhythm.     Pulses: Normal pulses.     Heart sounds: No murmur heard. Pulmonary:     Effort: Pulmonary effort is normal. No respiratory distress.     Breath sounds: Normal breath sounds.  Abdominal:     General: Bowel sounds are normal. There is no distension.     Palpations: Abdomen is soft.     Tenderness: There is no abdominal tenderness.  Musculoskeletal:        General: Normal range of motion.     Cervical back: Neck supple.  Skin:    General: Skin is warm.     Capillary Refill: Capillary refill takes less than 2 seconds.   Neurological:     General: No focal deficit present.     Mental Status: He is oriented to person, place, and time. He is lethargic.     Cranial Nerves: Cranial nerves 2-12 are intact.     Motor: Motor function is intact.  Psychiatric:        Speech: Speech is delayed.        Behavior: Behavior is slowed. Behavior is cooperative.        Thought Content: Thought content normal.        Cognition and Memory: He exhibits impaired recent memory.    Procedures  Procedures  ED Course / MDM    Medical Decision Making Amount and/or Complexity of Data Reviewed Labs: ordered. Radiology: ordered.  Risk OTC drugs.  This patient presents to the ED for concern of hypoglycemia, this involves an extensive number of treatment options, and is a complaint that carries with it a high risk of complications and morbidity.  The differential diagnosis includes somogyi effect, acute illness, seizure, etc.  Co morbidities that complicate the patient evaluation T1DM  Additional history obtained:  Additional history  obtained from: Wife at bedside External records from outside source obtained and reviewed including: Most recent 2 family medicine physician notes  EKG: EKG: normal EKG, normal sinus rhythm.   Cardiac Monitoring: The patient was maintained on a cardiac monitor.  I personally viewed and interpreted the cardiac monitored which showed an underlying rhythm of: Sinus rhythm  Lab Results: I personally ordered, reviewed, and interpreted labs. Pertinent results include: BMP shows metabolic acidosis  CBC within normal limits Glucose 198 --> 156 --> 297 (ate between sugar checks) ---> 244 Lipase negative Mag within normal limits    Imaging Studies ordered:  I ordered imaging studies which included x-ray and CT.  I independently reviewed & interpreted imaging & am in agreement with radiology impression. Imaging shows: Chest x-ray within normal limits CT head negative   Medications  I  ordered medication including bacitracin for skin tear Reevaluation of the patient after medication shows that patient  n/a -I reviewed the patient's home medications and did not make adjustments. -I did not prescribe new home medications.  Tests Considered: Lactic for possible seizure - deferred   Critical Interventions: Glucose checks  Consultations: None required  SDH None identified  ED Course: 56 year old male who presents to emergency department hypoglycemia.  Initially on exam he is drowsy.  He will wake to voice and verbalize and then he drifts off to sleep again.  Nonfocal neuro exam.  The rest of his physical exam is unremarkable. Labs as above.  He has not been hypoglycemic here in the emergency department. Given that he fell, previous provider obtain CT head which was negative. In an effort to look for other etiologies of hypoglycemia, ordered chest x-ray and UA from previous provider which were both negative. He has no elevated lipase or concern for pancreatitis at this time. Mag is within normal limits  1043: Reassessment of the patient and spoke with patient at this time. He is more awake. Continues to be oriented. He is verbalizing much more. States that he took 4 units of fast-acting insulin last night ~11pm after they got home because his BG was >300. States this is not normal for him to do.   He does have a metabolic acidosis, likely from a seizure. I do think he had a seizure event, although wife did not witness any seizure-like activity. Since event at home, he has been significantly more drowsy than previous hypoglycemic episodes. He has had no further seizure like episodes here. BG is under control. He is back to baseline mental status A&O and verbal with me. He has taken PO without difficulty.   Discussed with him close follow-up.  I have sent an urgent referral to endocrinology so that he can have management of his insulin regimen.  They are in agreement with  this.  I have also given him strict return precautions for any seizure-like activity or continued episodes of hypoglycemia.  After being evaluated here from a 6 hours he has no further hypoglycemia or seizure-like activity.  He is back to baseline mental status.  I had a long discussion with them at the bedside that I do think that he had a seizure from this episode.  I do think this may be from taking quick acting insulin right before bed.  I have asked them to avoid this until they are able to see endocrinology.  Discussed that I would rather him have permissive hyperglycemia overnight rather than taking quick acting insulin and having another seizure event, diabetic coma, death.  They verbalized understanding.  Given very strict return precautions.  Wife and patient verbalized understanding.  After consideration of the diagnostic results and the patients response to treatment, I feel that the patent would benefit from discharge.  Considered admission for the hypoglycemia event and possible seizure.  Although the patient is now at baseline.  His work-up has been unremarkable and he has continued to improve throughout his stay here.  Think that it is safe for him to be discharged home with strict return precautions and endocrinology follow-up.  If he were to return, would consider admission at that time.. The patient has been appropriately medically screened and/or stabilized in the ED. I have low suspicion for any other emergent medical condition which would require further screening, evaluation or treatment in the ED or require inpatient management. The patient is overall well appearing and non-toxic in appearance. They are hemodynamically stable at time of discharge.         Mickie Hillier, PA-C 06/02/21 [place], DO 06/02/21 1454

## 2021-06-02 NOTE — ED Triage Notes (Signed)
Pt BIB EMS wife states that she heard pt fall and checked his sugar and found to be 44 and unable to arouse enough to drink juice. D50 given by EMS CBG improved to 208. Skin tear noted to top of head. Pt unable to remember event, c/o headache, and numbness to right side of face.

## 2021-06-02 NOTE — ED Provider Notes (Signed)
[facility] EMERGENCY DEPARTMENT Provider Note   CSN: [id number] Arrival date & time: 06/02/21  0550     History  Chief Complaint  Patient presents with   Hypoglycemia    [name] is a 56 y.o. male  Diabetes mellituswith type I insulin-dependent he woke up this morning and according to the patient's wife she heard him get up and then heard him fall.  She went to his side and was unable to arouse him.  She was unable to get him to stay still enough to check his blood sugar; was able to get him to drink one half of a glass of orange juice before he refused to swallow anymore and began to gag.  She then called EMS and when they arrived they found his CBG to be 44 after the orange juice.    They were able to administer an amp of D50 in route with improvement in his sugar to greater than 200.  Level 5 caveat due to acuity of patient's presentation upon arrival.  At this time patient is somnolent but easily arousable to voice.  Does not recall the incident.  Will await his wife's arrival for further insight.  I personally reviewed his medical records.  In addition to the above listed he has history of hypertension, GERD, anemia, and hyperlipidemia.  HPI     Home Medications Prior to Admission medications   Medication Sig Start Date End Date Taking? Authorizing Provider  Blood Pressure KIT Use daily as needed to check blood pressure 04/25/21   Vivi Barrack, MD  esomeprazole (NEXIUM) 40 MG capsule TAKE (1) CAPSULE BY MOUTH ONCE DAILY. 02/02/21   Vivi Barrack, MD  ferrous sulfate (SLOW RELEASE IRON) 160 (50 Fe) MG TBCR SR tablet Take 1 tablet (160 mg total) by mouth 2 (two) times daily. 11/13/17   Irene Shipper, MD  insulin glargine-yfgn (SEMGLEE) 100 UNIT/ML Pen Inject 22 Units into the skin daily. 04/25/21   Vivi Barrack, MD  insulin lispro (HUMALOG KWIKPEN) 100 UNIT/ML KwikPen INJECT 7-12 UNITS UNDER THE SKIN THREE TIMES DAILY WITH MEALS. 02/02/21   Vivi Barrack, MD  Insulin Pen Needle (BD PEN NEEDLE NANO U/F) 32G X 4 MM MISC USE TO INJECT INSULIN UP TO FOUR TIMES DAILY 02/02/21   Vivi Barrack, MD  ipratropium (ATROVENT) 0.06 % nasal spray PLACE 2 SPRAYS IN BOTH NOSTRILS 4 TIMES DAILY. 09/27/20   Vivi Barrack, MD  irbesartan (AVAPRO) 300 MG tablet Take 1 tablet (300 mg total) by mouth daily. 02/02/21   Vivi Barrack, MD  metFORMIN (GLUCOPHAGE-XR) 500 MG 24 hr tablet Take 2 tablets (1,000 mg total) by mouth 2 (two) times daily. 02/02/21   Vivi Barrack, MD  sildenafil (REVATIO) 20 MG tablet Take 1-5 tablets (20-100 mg total) by mouth daily as needed (erectile dysfunction). 08/04/19   Vivi Barrack, MD  Vitamin D, Ergocalciferol, (DRISDOL) 1.25 MG (50000 UNIT) CAPS capsule Take 1 capsule (50,000 Units total) by mouth every 7 (seven) days. 02/07/21   Vivi Barrack, MD  Vitamin D, Ergocalciferol, (DRISDOL) 1.25 MG (50000 UNIT) CAPS capsule Take 1 capsule (50,000 Units total) by mouth every 7 (seven) days. 04/27/21   Vivi Barrack, MD      Allergies    Crestor [rosuvastatin calcium], Iron, Lipitor [atorvastatin calcium], and Statins    Review of Systems   Review of Systems  Unable to perform ROS: Mental status change   Physical  Exam Updated Vital Signs BP (!) 176/99   Pulse 85   Temp 97.9 F (36.6 C) (Oral)   Resp 19   Ht '5\' 10"'  (1.778 m)   Wt 82.9 kg   SpO2 97%   BMI 26.22 kg/m  Physical Exam Vitals and nursing note reviewed.  Constitutional:      Appearance: He is not ill-appearing or toxic-appearing.  HENT:     Head: Normocephalic.      Nose: Nose normal.     Mouth/Throat:     Mouth: Mucous membranes are moist.     Pharynx: Oropharynx is clear. Uvula midline. No oropharyngeal exudate or posterior oropharyngeal erythema.     Tonsils: No tonsillar exudate.  Eyes:     General: Lids are normal. Vision grossly intact.        Right eye: No discharge.        Left eye: No discharge.     Extraocular Movements: Extraocular  movements intact.     Conjunctiva/sclera: Conjunctivae normal.     Pupils: Pupils are equal, round, and reactive to light.  Neck:     Trachea: Trachea and phonation normal.  Cardiovascular:     Rate and Rhythm: Normal rate and regular rhythm.     Pulses: Normal pulses.     Heart sounds: Normal heart sounds. No murmur heard. Pulmonary:     Effort: Pulmonary effort is normal. No tachypnea, bradypnea, accessory muscle usage or respiratory distress.     Breath sounds: Normal breath sounds. No wheezing or rales.  Chest:     Chest wall: No mass, lacerations, deformity, swelling, tenderness or crepitus.  Abdominal:     General: Bowel sounds are normal. There is no distension.     Palpations: Abdomen is soft.     Tenderness: There is no abdominal tenderness. There is no right CVA tenderness, left CVA tenderness, guarding or rebound.  Musculoskeletal:        General: No deformity.     Cervical back: Normal range of motion and neck supple.     Right lower leg: No edema.     Left lower leg: No edema.  Lymphadenopathy:     Cervical: No cervical adenopathy.  Skin:    General: Skin is warm and dry.     Capillary Refill: Capillary refill takes less than 2 seconds.  Neurological:     General: No focal deficit present.     Mental Status: He is alert and oriented to person, place, and time. Mental status is at baseline.     GCS: GCS eye subscore is 3. GCS verbal subscore is 5. GCS motor subscore is 6.     Cranial Nerves: Cranial nerves 2-12 are intact.     Sensory: Sensation is intact.     Motor: Motor function is intact.  Psychiatric:        Mood and Affect: Mood normal.    ED Results / Procedures / Treatments   Labs (all labs ordered are listed, but only abnormal results are displayed) Labs Reviewed  CBG MONITORING, ED - Abnormal; Notable for the following components:      Result Value   Glucose-Capillary 198 (*)    All other components within normal limits  BASIC METABOLIC PANEL  CBC   URINALYSIS, ROUTINE W REFLEX MICROSCOPIC  CBG MONITORING, ED    EKG None  Radiology No results found.  Procedures Procedures    Medications Ordered in ED Medications  sodium chloride flush (NS) 0.9 % injection 3 mL (has  no administration in time range)  sodium chloride flush (NS) 0.9 % injection 3 mL (has no administration in time range)    ED Course/ Medical Decision Making/ A&P                           Medical Decision Making 56 year old male with history of type 1 diabetes who presents after episode of syncope and collapse as well as altered mental status hypoglycemia.  Hypertensive on intake, vital signs otherwise normal.  Cardiopulmonary exam is normal, abdominal exam is benign.  Patient with skin tear to the scalp and to the dorsum of the right hand.  Patient easily arousable to voice with GCS of 14, ANO x4.  No focal deficit on neurologic exam initially but subsequently patient states that he feels like the right side of his face is pins-and-needles.    Amount and/or Complexity of Data Reviewed Labs: ordered.    Details: CBG 198   Patient signed out to oncoming ED provider Theodis Blaze, PA-C at time of shift change patient awaiting laboratory studies, CT imaging, chest x-ray and reevaluation.  All pertinent HPI and physical exam findings were discussed with her prior to my departure.  I appreciate her collaboration in care of this patient.  Freddy's family voiced understanding of his medical evaluation and treatment plan thus far.  This chart was dictated using voice recognition software, Dragon. Despite the best efforts of this provider to proofread and correct errors, errors may still occur which can change documentation meaning.  Final Clinical Impression(s) / ED Diagnoses Final diagnoses:  None    Rx / DC Orders ED Discharge Orders     None         Emeline Darling, PA-C 06/02/21 0655    Maudie Flakes, MD 06/02/21 [phone]

## 2021-06-02 NOTE — ED Notes (Signed)
Returned from CT.

## 2021-06-02 NOTE — ED Notes (Signed)
Patient transported to CT 

## 2021-06-02 NOTE — Telephone Encounter (Signed)
Called pt for urgent referral from Hospital. Patient can be seen on Monday per [facility].

## 2021-06-04 ENCOUNTER — Other Ambulatory Visit: Payer: Self-pay | Admitting: Family Medicine

## 2021-06-05 ENCOUNTER — Other Ambulatory Visit: Payer: Self-pay | Admitting: Family Medicine

## 2021-06-06 ENCOUNTER — Encounter: Payer: Self-pay | Admitting: Nurse Practitioner

## 2021-06-06 ENCOUNTER — Ambulatory Visit (INDEPENDENT_AMBULATORY_CARE_PROVIDER_SITE_OTHER): Payer: Commercial Managed Care - PPO | Admitting: Nurse Practitioner

## 2021-06-06 VITALS — BP 145/84 | HR 67 | Ht 70.0 in | Wt 180.0 lb

## 2021-06-06 DIAGNOSIS — I10 Essential (primary) hypertension: Secondary | ICD-10-CM

## 2021-06-06 DIAGNOSIS — E109 Type 1 diabetes mellitus without complications: Secondary | ICD-10-CM | POA: Diagnosis not present

## 2021-06-06 DIAGNOSIS — E782 Mixed hyperlipidemia: Secondary | ICD-10-CM

## 2021-06-06 MED ORDER — BD PEN NEEDLE NANO U/F 32G X 4 MM MISC: 0 refills | Status: DC

## 2021-06-06 MED ORDER — INSULIN LISPRO (1 UNIT DIAL) 100 UNIT/ML (KWIKPEN): PEN_INJECTOR | SUBCUTANEOUS | 0 refills | Status: DC

## 2021-06-06 MED ORDER — DEXCOM G6 SENSOR MISC: 3 refills | Status: DC

## 2021-06-06 MED ORDER — DEXCOM G6 TRANSMITTER MISC: 3 refills | Status: DC

## 2021-06-06 MED ORDER — INSULIN GLARGINE-YFGN 100 UNIT/ML ~~LOC~~ SOPN: 18.0000 [IU] | PEN_INJECTOR | Freq: Every day | SUBCUTANEOUS | 3 refills | Status: DC

## 2021-06-06 NOTE — Patient Instructions (Signed)

## 2021-06-06 NOTE — Progress Notes (Signed)
Endocrinology Consult Note       06/06/2021, 11:29 AM   Subjective:    Patient ID: [name], male    DOB: 1965/04/04.  [name] is being seen in consultation for management of currently uncontrolled symptomatic diabetes requested by  Vivi Barrack, MD.   Past Medical History:  Diagnosis Date   Allergy    Anemia    Diabetes mellitus without complication (HCC)    GERD (gastroesophageal reflux disease)    HLD (hyperlipidemia)    Hypertension     Past Surgical History:  Procedure Laterality Date   ANKLE SURGERY Right    UPPER GASTROINTESTINAL ENDOSCOPY     VASECTOMY  1994    Social History   Socioeconomic History   Marital status: Married    Spouse name: Not on file   Number of children: 1   Years of education: Not on file   Highest education level: Not on file  Occupational History   Occupation: Architect  Tobacco Use   Smoking status: Every Day    Packs/day: 1.00    Types: Cigarettes   Smokeless tobacco: Never  Vaping Use   Vaping Use: Never used  Substance and Sexual Activity   Alcohol use: Yes    Alcohol/week: 8.0 standard drinks    Types: 8 Cans of beer per week    Comment: occ   Drug use: No   Sexual activity: Yes  Other Topics Concern   Not on file  Social History Narrative   Not on file   Social Determinants of Health   Financial Resource Strain: Not on file  Food Insecurity: Not on file  Transportation Needs: Not on file  Physical Activity: Not on file  Stress: Not on file  Social Connections: Not on file    Family History  Problem Relation Age of Onset   COPD Mother    Diabetes Mother    Early death Father 33       MI   Heart attack Father    Lymphoma Sister 86   Hypertension Brother    Diabetes Maternal Grandmother    Stroke Neg Hx    Alcohol abuse Neg Hx    Drug abuse Neg Hx    Hyperlipidemia Neg Hx    Kidney disease Neg Hx    Colon cancer Neg  Hx    Esophageal cancer Neg Hx    Stomach cancer Neg Hx    Rectal cancer Neg Hx     Outpatient Encounter Medications as of 06/06/2021  Medication Sig   Continuous Blood Gluc Sensor (DEXCOM G6 SENSOR) MISC Change sensor every 10 days as directed   Continuous Blood Gluc Transmit (DEXCOM G6 TRANSMITTER) MISC Change transmitter every 90 days as directed.   Blood Pressure KIT Use daily as needed to check blood pressure   esomeprazole (NEXIUM) 40 MG capsule TAKE (1) CAPSULE BY MOUTH ONCE DAILY.   ferrous sulfate (SLOW RELEASE IRON) 160 (50 Fe) MG TBCR SR tablet Take 1 tablet (160 mg total) by mouth 2 (two) times daily.   insulin glargine-yfgn (SEMGLEE) 100 UNIT/ML Pen Inject 18 Units into the skin at bedtime.   insulin lispro (HUMALOG KWIKPEN) 100  UNIT/ML KwikPen INJECT 4-7 UNITS UNDER THE SKIN THREE TIMES DAILY WITH MEALS.   Insulin Pen Needle (BD PEN NEEDLE NANO U/F) 32G X 4 MM MISC USE TO INJECT INSULIN UP TO FOUR TIMES DAILY   irbesartan (AVAPRO) 300 MG tablet Take 1 tablet (300 mg total) by mouth daily.   pantoprazole (PROTONIX) 40 MG tablet Take 40 mg by mouth daily. (Patient not taking: Reported on 06/06/2021)   sildenafil (REVATIO) 20 MG tablet Take 1-5 tablets (20-100 mg total) by mouth daily as needed (erectile dysfunction).   Vitamin D, Ergocalciferol, (DRISDOL) 1.25 MG (50000 UNIT) CAPS capsule Take 1 capsule (50,000 Units total) by mouth every 7 (seven) days.   [DISCONTINUED] esomeprazole (NEXIUM) 40 MG capsule TAKE (1) CAPSULE BY MOUTH ONCE DAILY.   [DISCONTINUED] insulin glargine-yfgn (SEMGLEE) 100 UNIT/ML Pen Inject 22 Units into the skin daily.   [DISCONTINUED] insulin lispro (HUMALOG KWIKPEN) 100 UNIT/ML KwikPen INJECT 7-12 UNITS UNDER THE SKIN THREE TIMES DAILY WITH MEALS.   [DISCONTINUED] Insulin Pen Needle (BD PEN NEEDLE NANO U/F) 32G X 4 MM MISC USE TO INJECT INSULIN UP TO FOUR TIMES DAILY   [DISCONTINUED] ipratropium (ATROVENT) 0.06 % nasal spray PLACE 2 SPRAYS IN BOTH NOSTRILS  4 TIMES DAILY. (Patient not taking: Reported on 06/02/2021)   [DISCONTINUED] metFORMIN (GLUCOPHAGE-XR) 500 MG 24 hr tablet Take 2 tablets (1,000 mg total) by mouth 2 (two) times daily.   No facility-administered encounter medications on file as of 06/06/2021.    ALLERGIES: Allergies  Allergen Reactions   Crestor [Rosuvastatin Calcium] Other (See Comments)    Muscle aches   Iron Other (See Comments)    "Tore up my stomach"  "Liquid Iron"   Lipitor [Atorvastatin Calcium] Other (See Comments)    Muscles aches   Statins Other (See Comments)    Muscle aches    VACCINATION STATUS: Immunization History  Administered Date(s) Administered   Influenza,inj,Quad PF,6+ Mos 01/18/2016, 10/17/2017, 11/06/2019, 10/28/2020   Influenza,inj,quad, With Preservative 10/30/2018   Influenza-Unspecified 10/17/2017   PFIZER(Purple Top)SARS-COV-2 Vaccination 04/10/2019, 05/05/2019   Pneumococcal Polysaccharide-23 10/21/2014   Tdap 06/02/2013    Diabetes He presents for his initial diabetic visit. He has type 1 diabetes mellitus. Onset time: Diagnosed with LADA at age of 56. His disease course has been fluctuating. Hypoglycemia symptoms include nervousness/anxiousness, sweats and tremors. Associated symptoms include blurred vision and fatigue. Hypoglycemia complications include hospitalization and nocturnal hypoglycemia. Symptoms are stable. There are no diabetic complications. Risk factors for coronary artery disease include diabetes mellitus, dyslipidemia, family history, male sex and tobacco exposure. Current diabetic treatment includes intensive insulin program and oral agent (monotherapy). He is compliant with treatment most of the time. His weight is stable. He is following a generally unhealthy diet. When asked about meal planning, he reported none. He has not had a previous visit with a dietitian. He participates in exercise daily. (He presents today for his consultation with no meter or logs to review.   His most recent A1c was 7.1% on 04/25/21.  He has recently had ED trip for hypoglycemia.  He was diagnosed with LADA in 2007.  He monitors glucose 4-6 times per day.  He drinks water, gatorade zero (during hot days in the sun working), and occasionally a diet soda.  He does not have any sort of routine eating schedule.  He job is physically active.  He is due for eye exam, has never seen podiatry in the past.  He does note frequent drops in glucose.) An ACE inhibitor/angiotensin II receptor  blocker is being taken. He does not see a podiatrist.Eye exam is not current.    Review of systems  Constitutional: + Minimally fluctuating body weight, current Body mass index is 25.83 kg/m., + fatigue, no subjective hyperthermia, no subjective hypothermia Eyes: + blurry vision-improved, no xerophthalmia ENT: no sore throat, no nodules palpated in throat, no dysphagia/odynophagia, no hoarseness Cardiovascular: no chest pain, no shortness of breath, no palpitations, no leg swelling Respiratory: no cough, no shortness of breath Gastrointestinal: no nausea/vomiting/diarrhea Musculoskeletal: no muscle/joint aches Skin: no rashes, no hyperemia Neurological: no tremors, no numbness, no tingling, no dizziness Psychiatric: no depression, no anxiety  Objective:     BP (!) 145/84   Pulse 67   Ht '5\' 10"'  (1.778 m)   Wt 180 lb (81.6 kg)   BMI 25.83 kg/m   Wt Readings from Last 3 Encounters:  06/06/21 180 lb (81.6 kg)  06/02/21 182 lb 12.2 oz (82.9 kg)  04/25/21 182 lb 12.8 oz (82.9 kg)     BP Readings from Last 3 Encounters:  06/06/21 (!) 145/84  06/02/21 (!) 153/90  04/25/21 (!) 162/102     Physical Exam- Limited  Constitutional:  Body mass index is 25.83 kg/m. , not in acute distress, normal state of mind Eyes:  EOMI, no exophthalmos Neck: Supple Cardiovascular: RRR, no murmurs, rubs, or gallops, no edema Respiratory: Adequate breathing efforts, no crackles, rales, rhonchi, or  wheezing Musculoskeletal: no gross deformities, strength intact in all four extremities, no gross restriction of joint movements Skin:  no rashes, no hyperemia Neurological: no tremor with outstretched hands    CMP ( most recent) CMP     Component Value Date/Time   NA 135 06/02/2021 0625   K 4.7 06/02/2021 0625   CL 105 06/02/2021 0625   CO2 18 (L) 06/02/2021 0625   GLUCOSE 170 (H) 06/02/2021 0625   BUN 15 06/02/2021 0625   CREATININE 1.03 06/02/2021 0625   CALCIUM 8.7 (L) 06/02/2021 0625   PROT 7.3 02/02/2021 1046   ALBUMIN 4.1 02/02/2021 1046   AST 23 02/02/2021 1046   ALT 18 02/02/2021 1046   ALKPHOS 111 02/02/2021 1046   BILITOT 0.2 02/02/2021 1046   GFRNONAA >60 06/02/2021 0625   GFRAA >60 10/10/2017 0542     Diabetic Labs (most recent): Lab Results  Component Value Date   HGBA1C 7.1 (A) 04/25/2021   HGBA1C 8.7 (H) 02/02/2021   HGBA1C 7.7 (A) 10/28/2020     Lipid Panel ( most recent) Lipid Panel     Component Value Date/Time   CHOL 241 (H) 10/28/2020 0943   TRIG 77.0 10/28/2020 0943   HDL 90.50 10/28/2020 0943   CHOLHDL 3 10/28/2020 0943   VLDL 15.4 10/28/2020 0943   LDLCALC 135 (H) 10/28/2020 0943   LDLDIRECT 156.0 01/18/2016 1020      Lab Results  Component Value Date   TSH 1.78 02/02/2021   TSH 1.65 10/28/2020   TSH 2.15 01/18/2016           Assessment & Plan:   1) Type 1 diabetes mellitus without complication ([place])  He presents today for his consultation with no meter or logs to review.  His most recent A1c was 7.1% on 04/25/21.  He has recently had ED trip for hypoglycemia.  He was diagnosed with LADA in 2007.  He monitors glucose 4-6 times per day.  He drinks water, gatorade zero (during hot days in the sun working), and occasionally a diet soda.  He does not have any  sort of routine eating schedule.  He job is physically active.  He is due for eye exam, has never seen podiatry in the past.  He does note frequent drops in glucose.  - [name] has currently uncontrolled symptomatic LADA DM since 56 years of age, with most recent A1c of 7.1 %.   -Recent labs reviewed.  - I had a long discussion with him about the progressive nature of diabetes and the pathology behind its complications. -his diabetes is complicated by drastic fluctuations and severe hypoglycemia and he remains at a high risk for more acute and chronic complications which include CAD, CVA, CKD, retinopathy, and neuropathy. These are all discussed in detail with him.  The following Lifestyle Medicine recommendations according to [facility]) were discussed and offered to patient and he agrees to start the journey:  A. Whole Foods, Plant-based plate comprising of fruits and vegetables, plant-based proteins, whole-grain carbohydrates was discussed in detail with the patient.   A list for source of those nutrients were also provided to the patient.  Patient will use only water or unsweetened tea for hydration. B.  The need to stay away from risky substances including alcohol, smoking; obtaining 7 to 9 hours of restorative sleep, at least 150 minutes of moderate intensity exercise weekly, the importance of healthy social connections,  and stress reduction techniques were discussed. C.  A full color page of  Calorie density of various food groups per pound showing examples of each food groups was provided to the patient.  - I have counseled him on diet and weight management by adopting a carbohydrate restricted/protein rich diet. Patient is encouraged to switch to unprocessed or minimally processed complex starch and increased protein intake (animal or plant source), fruits, and vegetables. -  he is advised to stick to a routine mealtimes to eat 3 meals a day and avoid unnecessary snacks (to snack only to correct hypoglycemia).   - he acknowledges that there is a room for improvement in his food and drink choices. - Suggestion is made for  him to avoid simple carbohydrates from his diet including Cakes, Sweet Desserts, Ice Cream, Soda (diet and regular), Sweet Tea, Candies, Chips, Cookies, Store Bought Juices, Alcohol in Excess of 1-2 drinks a day, Artificial Sweeteners, Coffee Creamer, and "Sugar-free" Products. This will help patient to have more stable blood glucose profile and potentially avoid unintended weight Golden.  - I have approached him with the following individualized plan to manage his diabetes and patient agrees:   -He is advised to lower his Semglee to 18 units SQ nightly (changing from morning admin) and adjust his Humalog to 4-7 units TID with meals if glucose is above 90 and he is eating (Specific instructions on how to titrate insulin dosage based on glucose readings given to patient in writing).  He demonstrated his ability to properly use the SSI today with me.  -he is encouraged to start monitoring glucose 4 times daily, before meals and before bed, to log their readings on the clinic sheets provided, and bring them to review at follow up appointment in 2 weeks.  He could greatly benefit from CGM device, I ordered Dexcm G6 for him through his local pharmacy.  We also discussed the possibility of using an insulin pump such as Omnipod 5 in the future.  - he is warned not to take insulin without proper monitoring per orders. - Adjustment parameters are given to him for hypo and hyperglycemia  in writing. - he is encouraged to call clinic for blood glucose levels less than 70 or above 300 mg /dl.  - his Metformin will be discontinued, risk outweighs benefit for this patient, given his type 1 diagnosis where insulin treatment is necessary.  - Specific targets for  A1c; LDL, HDL, and Triglycerides were discussed with the patient.  2) Blood Pressure /Hypertension:  his blood pressure is not controlled to target.   he is advised to continue his current medications including Irbesartan 300 mg p.o. daily with  breakfast.  3) Lipids/Hyperlipidemia:    Review of his recent lipid panel from 10/28/20 showed uncontrolled LDL at 135 . He is allergic to statin medications.  He is advised to avoid fried foods and butter.  4)  Weight/Diet:  his Body mass index is 25.83 kg/m.  -    he is NOT a candidate for weight loss.   Exercise, and detailed carbohydrates information provided  -  detailed on discharge instructions.  5) Chronic Care/Health Maintenance: -he is on ACEI/ARB and not on Statin medications and is encouraged to initiate and continue to follow up with Ophthalmology, Dentist, Podiatrist at least yearly or according to recommendations, and advised to [facility]. I have recommended yearly flu vaccine and pneumonia vaccine at least every 5 years; moderate intensity exercise for up to 150 minutes weekly; and sleep for at least 7 hours a day.  - he is advised to maintain close follow up with Vivi Barrack, MD for primary care needs, as well as his other providers for optimal and coordinated care.   - Time spent in this patient care: 60 min, of which > 50% was spent in counseling him about his diabetes and the rest reviewing his blood glucose logs, discussing his hypoglycemia and hyperglycemia episodes, reviewing his current and previous labs/studies (including abstraction from other facilities) and medications doses and developing a long term treatment plan based on the latest standards of care/guidelines; and documenting his care.    Please refer to Patient Instructions for Blood Glucose Monitoring and Insulin/Medications Dosing Guide" in media tab for additional information. Please also refer to "Patient Self Inventory" in the Media tab for reviewed elements of pertinent patient history.  [name] participated in the discussions, expressed understanding, and voiced agreement with the above plans.  All questions were answered to his satisfaction. he is encouraged to contact clinic should he have  any questions or concerns prior to his return visit.     Follow up plan: - Return in about 2 weeks (around 06/20/2021) for Bring meter and logs, Diabetes F/U.    Rayetta Pigg, [facility] [address] [place], [place] [address] Phone: [phone] Fax: [phone]  06/06/2021, 11:29 AM

## 2021-06-07 MED ORDER — OMNIPOD 5 DEXG7G6 INTRO GEN 5 KIT: PACK | 0 refills | Status: DC

## 2021-06-07 MED ORDER — OMNIPOD 5 DEXG7G6 PODS GEN 5 MISC: 11 refills | Status: DC

## 2021-06-20 ENCOUNTER — Ambulatory Visit (INDEPENDENT_AMBULATORY_CARE_PROVIDER_SITE_OTHER): Payer: Commercial Managed Care - PPO | Admitting: Nurse Practitioner

## 2021-06-20 ENCOUNTER — Encounter: Payer: Self-pay | Admitting: Nurse Practitioner

## 2021-06-20 VITALS — BP 136/85 | HR 88 | Ht 70.0 in | Wt 183.0 lb

## 2021-06-20 DIAGNOSIS — I10 Essential (primary) hypertension: Secondary | ICD-10-CM | POA: Diagnosis not present

## 2021-06-20 DIAGNOSIS — E782 Mixed hyperlipidemia: Secondary | ICD-10-CM | POA: Diagnosis not present

## 2021-06-20 DIAGNOSIS — E109 Type 1 diabetes mellitus without complications: Secondary | ICD-10-CM

## 2021-06-20 LAB — POCT UA - MICROALBUMIN
Albumin/Creatinine Ratio, Urine, POC: >300
Creatinine, POC: 50 mg/dL
Microalbumin Ur, POC: 150 mg/L

## 2021-06-20 NOTE — Patient Instructions (Signed)

## 2021-06-20 NOTE — Progress Notes (Addendum)
Endocrinology Follow Up Note       06/20/2021, 12:09 PM   Subjective:    Patient ID: [name], male    DOB: 18-Oct-1965.  [name] is being seen in follow up after being seen in consultation for management of currently uncontrolled symptomatic diabetes requested by  Vivi Barrack, MD.   Past Medical History:  Diagnosis Date   Allergy    Anemia    Diabetes mellitus without complication (HCC)    GERD (gastroesophageal reflux disease)    HLD (hyperlipidemia)    Hypertension     Past Surgical History:  Procedure Laterality Date   ANKLE SURGERY Right    UPPER GASTROINTESTINAL ENDOSCOPY     VASECTOMY  1994    Social History   Socioeconomic History   Marital status: Married    Spouse name: Not on file   Number of children: 1   Years of education: Not on file   Highest education level: Not on file  Occupational History   Occupation: Architect  Tobacco Use   Smoking status: Every Day    Packs/day: 1.00    Types: Cigarettes   Smokeless tobacco: Never  Vaping Use   Vaping Use: Never used  Substance and Sexual Activity   Alcohol use: Yes    Alcohol/week: 8.0 standard drinks    Types: 8 Cans of beer per week    Comment: occ   Drug use: No   Sexual activity: Yes  Other Topics Concern   Not on file  Social History Narrative   Not on file   Social Determinants of Health   Financial Resource Strain: Not on file  Food Insecurity: Not on file  Transportation Needs: Not on file  Physical Activity: Not on file  Stress: Not on file  Social Connections: Not on file    Family History  Problem Relation Age of Onset   COPD Mother    Diabetes Mother    Early death Father 44       MI   Heart attack Father    Lymphoma Sister 76   Hypertension Brother    Diabetes Maternal Grandmother    Stroke Neg Hx    Alcohol abuse Neg Hx    Drug abuse Neg Hx    Hyperlipidemia Neg Hx    Kidney  disease Neg Hx    Colon cancer Neg Hx    Esophageal cancer Neg Hx    Stomach cancer Neg Hx    Rectal cancer Neg Hx     Outpatient Encounter Medications as of 06/20/2021  Medication Sig   Blood Pressure KIT Use daily as needed to check blood pressure   Continuous Blood Gluc Sensor (DEXCOM G6 SENSOR) MISC Change sensor every 10 days as directed   Continuous Blood Gluc Transmit (DEXCOM G6 TRANSMITTER) MISC Change transmitter every 90 days as directed.   esomeprazole (NEXIUM) 40 MG capsule TAKE (1) CAPSULE BY MOUTH ONCE DAILY.   ferrous sulfate (SLOW RELEASE IRON) 160 (50 Fe) MG TBCR SR tablet Take 1 tablet (160 mg total) by mouth 2 (two) times daily.   Insulin Disposable Pump (OMNIPOD 5 G6 INTRO, GEN 5,) KIT Change pod every 48-72  hrs   Insulin Disposable Pump (OMNIPOD 5 G6 POD, GEN 5,) MISC Change pod every 48-72 hrs   insulin glargine-yfgn (SEMGLEE) 100 UNIT/ML Pen Inject 18 Units into the skin at bedtime.   insulin lispro (HUMALOG KWIKPEN) 100 UNIT/ML KwikPen INJECT 4-7 UNITS UNDER THE SKIN THREE TIMES DAILY WITH MEALS.   Insulin Pen Needle (BD PEN NEEDLE NANO U/F) 32G X 4 MM MISC USE TO INJECT INSULIN UP TO FOUR TIMES DAILY   irbesartan (AVAPRO) 300 MG tablet Take 1 tablet (300 mg total) by mouth daily.   sildenafil (REVATIO) 20 MG tablet Take 1-5 tablets (20-100 mg total) by mouth daily as needed (erectile dysfunction).   Vitamin D, Ergocalciferol, (DRISDOL) 1.25 MG (50000 UNIT) CAPS capsule Take 1 capsule (50,000 Units total) by mouth every 7 (seven) days.   [DISCONTINUED] pantoprazole (PROTONIX) 40 MG tablet Take 40 mg by mouth daily. (Patient not taking: Reported on 06/06/2021)   No facility-administered encounter medications on file as of 06/20/2021.    ALLERGIES: Allergies  Allergen Reactions   Crestor [Rosuvastatin Calcium] Other (See Comments)    Muscle aches   Iron Other (See Comments)    "Tore up my stomach"  "Liquid Iron"   Lipitor [Atorvastatin Calcium] Other (See Comments)     Muscles aches   Statins Other (See Comments)    Muscle aches    VACCINATION STATUS: Immunization History  Administered Date(s) Administered   Influenza,inj,Quad PF,6+ Mos 01/18/2016, 10/17/2017, 11/06/2019, 10/28/2020   Influenza,inj,quad, With Preservative 10/30/2018   Influenza-Unspecified 10/17/2017   PFIZER(Purple Top)SARS-COV-2 Vaccination 04/10/2019, 05/05/2019   Pneumococcal Polysaccharide-23 10/21/2014   Tdap 06/02/2013    Diabetes He presents for his follow-up diabetic visit. He has type 1 diabetes mellitus. Onset time: Diagnosed with LADA at age of 49. His disease course has been improving. There are no hypoglycemic associated symptoms. Associated symptoms include blurred vision and fatigue. There are no hypoglycemic complications. Symptoms are stable. There are no diabetic complications. Risk factors for coronary artery disease include diabetes mellitus, dyslipidemia, family history, male sex and tobacco exposure. Current diabetic treatment includes intensive insulin program. He is compliant with treatment most of the time. His weight is stable. He is following a generally unhealthy diet. When asked about meal planning, he reported none. He has not had a previous visit with a dietitian. He participates in exercise daily. His home blood glucose trend is decreasing steadily. (He presents today with his logs, no meter, showing continued fluctuating but improved glycemic profile overall.  Since his initial visit, he has not had any more significant hypoglycemia.  He has obtained his supplies to start his Omnipod and Dexcom combo, is waiting on training session to start.) An ACE inhibitor/angiotensin II receptor blocker is being taken. He does not see a podiatrist.Eye exam is not current.    Review of systems  Constitutional: + Minimally fluctuating body weight, current Body mass index is 26.26 kg/m., + fatigue, no subjective hyperthermia, no subjective hypothermia Eyes: + blurry  vision-improved, no xerophthalmia ENT: no sore throat, no nodules palpated in throat, no dysphagia/odynophagia, no hoarseness Cardiovascular: no chest pain, no shortness of breath, no palpitations, no leg swelling Respiratory: no cough, no shortness of breath Gastrointestinal: no nausea/vomiting/diarrhea Musculoskeletal: no muscle/joint aches Skin: no rashes, no hyperemia Neurological: no tremors, no numbness, no tingling, no dizziness Psychiatric: no depression, no anxiety  Objective:     BP 136/85   Pulse 88   Ht '5\' 10"'  (1.778 m)   Wt 183 lb (83 kg)  BMI 26.26 kg/m   Wt Readings from Last 3 Encounters:  06/20/21 183 lb (83 kg)  06/06/21 180 lb (81.6 kg)  06/02/21 182 lb 12.2 oz (82.9 kg)     BP Readings from Last 3 Encounters:  06/20/21 136/85  06/06/21 (!) 145/84  06/02/21 (!) 153/90      Physical Exam- Limited  Constitutional:  Body mass index is 26.26 kg/m. , not in acute distress, normal state of mind Eyes:  EOMI, no exophthalmos Neck: Supple Cardiovascular: RRR, no murmurs, rubs, or gallops, no edema Respiratory: Adequate breathing efforts, no crackles, rales, rhonchi, or wheezing Musculoskeletal: no gross deformities, strength intact in all four extremities, no gross restriction of joint movements Skin:  no rashes, no hyperemia Neurological: no tremor with outstretched hands   Diabetic Foot Exam - Simple   Simple Foot Form Diabetic Foot exam was performed with the following findings: Yes 06/20/2021 12:10 PM  Visual Inspection See comments: Yes Sensation Testing Intact to touch and monofilament testing bilaterally: Yes Pulse Check Posterior Tibialis and Dorsalis pulse intact bilaterally: Yes Comments Mild onychomycosis bilaterally     CMP ( most recent) CMP     Component Value Date/Time   NA 135 06/02/2021 0625   K 4.7 06/02/2021 0625   CL 105 06/02/2021 0625   CO2 18 (L) 06/02/2021 0625   GLUCOSE 170 (H) 06/02/2021 0625   BUN 15 06/02/2021  0625   CREATININE 1.03 06/02/2021 0625   CALCIUM 8.7 (L) 06/02/2021 0625   PROT 7.3 02/02/2021 1046   ALBUMIN 4.1 02/02/2021 1046   AST 23 02/02/2021 1046   ALT 18 02/02/2021 1046   ALKPHOS 111 02/02/2021 1046   BILITOT 0.2 02/02/2021 1046   GFRNONAA >60 06/02/2021 0625   GFRAA >60 10/10/2017 0542     Diabetic Labs (most recent): Lab Results  Component Value Date   HGBA1C 7.1 (A) 04/25/2021   HGBA1C 8.7 (H) 02/02/2021   HGBA1C 7.7 (A) 10/28/2020     Lipid Panel ( most recent) Lipid Panel     Component Value Date/Time   CHOL 241 (H) 10/28/2020 0943   TRIG 77.0 10/28/2020 0943   HDL 90.50 10/28/2020 0943   CHOLHDL 3 10/28/2020 0943   VLDL 15.4 10/28/2020 0943   LDLCALC 135 (H) 10/28/2020 0943   LDLDIRECT 156.0 01/18/2016 1020      Lab Results  Component Value Date   TSH 1.78 02/02/2021   TSH 1.65 10/28/2020   TSH 2.15 01/18/2016           Assessment & Plan:   1) Type 1 diabetes mellitus without complication ([place])  He presents today with his logs, no meter, showing continued fluctuating but improved glycemic profile overall.  Since his initial visit, he has not had any more significant hypoglycemia.  He has obtained his supplies to start his Omnipod and Dexcom combo, is waiting on training session to start.  - TREYON WYMORE has currently uncontrolled symptomatic LADA DM since 56 years of age, with most recent A1c of 7.1 %.   -Recent labs reviewed.  - I had a long discussion with him about the progressive nature of diabetes and the pathology behind its complications. -his diabetes is complicated by drastic fluctuations and severe hypoglycemia and he remains at a high risk for more acute and chronic complications which include CAD, CVA, CKD, retinopathy, and neuropathy. These are all discussed in detail with him.  The following Lifestyle Medicine recommendations according to [facility]) were discussed  and offered to patient and  he agrees to start the journey:  A. Whole Foods, Plant-based plate comprising of fruits and vegetables, plant-based proteins, whole-grain carbohydrates was discussed in detail with the patient.   A list for source of those nutrients were also provided to the patient.  Patient will use only water or unsweetened tea for hydration. B.  The need to stay away from risky substances including alcohol, smoking; obtaining 7 to 9 hours of restorative sleep, at least 150 minutes of moderate intensity exercise weekly, the importance of healthy social connections,  and stress reduction techniques were discussed. C.  A full color page of  Calorie density of various food groups per pound showing examples of each food groups was provided to the patient.  - Nutritional counseling repeated at each appointment due to patients tendency to fall back in to old habits.  - The patient admits there is a room for improvement in their diet and drink choices. -  Suggestion is made for the patient to avoid simple carbohydrates from their diet including Cakes, Sweet Desserts / Pastries, Ice Cream, Soda (diet and regular), Sweet Tea, Candies, Chips, Cookies, Sweet Pastries, Store Bought Juices, Alcohol in Excess of 1-2 drinks a day, Artificial Sweeteners, Coffee Creamer, and "Sugar-free" Products. This will help patient to have stable blood glucose profile and potentially avoid unintended weight Golden.   - I encouraged the patient to switch to unprocessed or minimally processed complex starch and increased protein intake (animal or plant source), fruits, and vegetables.   - Patient is advised to stick to a routine mealtimes to eat 3 meals a day and avoid unnecessary snacks (to snack only to correct hypoglycemia).  - I have approached him with the following individualized plan to manage his diabetes and patient agrees:   -He is advised to continue his current meds of Semglee 18 units SQ nightly and continue his Humalog to 4-7 units  TID with meals if glucose is above 90 and he is eating (Specific instructions on how to titrate insulin dosage based on glucose readings given to patient in writing).    -he is encouraged to continue monitoring glucose 4 times daily, before meals and before bed, and to call the clinic if he has readings less than 70 or above 300 for 3 tests in a row.  Will await Omnipod 5 and Dexcom start.  - he is warned not to take insulin without proper monitoring per orders. - Adjustment parameters are given to him for hypo and hyperglycemia in writing.  - his Metformin will be discontinued, risk outweighs benefit for this patient, given his type 1 diagnosis where insulin treatment is necessary.  - Specific targets for  A1c; LDL, HDL, and Triglycerides were discussed with the patient.  2) Blood Pressure /Hypertension:  his blood pressure is controlled to target.   he is advised to continue his current medications including Irbesartan 300 mg p.o. daily with breakfast.  3) Lipids/Hyperlipidemia:    Review of his recent lipid panel from 10/28/20 showed uncontrolled LDL at 135 . He is allergic to statin medications.  He is advised to avoid fried foods and butter.  4)  Weight/Diet:  his Body mass index is 26.26 kg/m.  -    he is NOT a candidate for weight loss.   Exercise, and detailed carbohydrates information provided  -  detailed on discharge instructions.  5) Chronic Care/Health Maintenance: -he is on ACEI/ARB and not on Statin medications and is encouraged to initiate and  continue to follow up with Ophthalmology, Dentist, Podiatrist at least yearly or according to recommendations, and advised to [place]. I have recommended yearly flu vaccine and pneumonia vaccine at least every 5 years; moderate intensity exercise for up to 150 minutes weekly; and sleep for at least 7 hours a day.  - he is advised to maintain close follow up with Vivi Barrack, MD for primary care needs, as well as his other  providers for optimal and coordinated care.     I spent 35 minutes in the care of the patient today including review of labs from [place], Lipids, Thyroid Function, Hematology (current and previous including abstractions from other facilities); face-to-face time discussing  his blood glucose readings/logs, discussing hypoglycemia and hyperglycemia episodes and symptoms, medications doses, his options of short and long term treatment based on the latest standards of care / guidelines;  discussion about incorporating lifestyle medicine;  and documenting the encounter.    Please refer to Patient Instructions for Blood Glucose Monitoring and Insulin/Medications Dosing Guide"  in media tab for additional information. Please  also refer to " Patient Self Inventory" in the Media  tab for reviewed elements of pertinent patient history.  [name] participated in the discussions, expressed understanding, and voiced agreement with the above plans.  All questions were answered to his satisfaction. he is encouraged to contact clinic should he have any questions or concerns prior to his return visit.     Follow up plan: - Return in about 3 months (around 09/20/2021) for Diabetes F/U with A1c in office, No previsit labs, Bring meter and logs.    Rayetta Pigg, [facility] [address] [place], [place] [address] Phone: [phone] Fax: [phone]  06/20/2021, 12:09 PM

## 2021-07-06 MED ORDER — INSULIN LISPRO 100 UNIT/ML IJ SOLN
INTRAMUSCULAR | 3 refills | Status: DC
Start: 2021-07-06 — End: 2021-08-29

## 2021-07-06 NOTE — Addendum Note (Signed)
Addended by: Brita Romp on: 07/06/2021 05:48 PM   Modules accepted: Orders

## 2021-08-01 ENCOUNTER — Ambulatory Visit (INDEPENDENT_AMBULATORY_CARE_PROVIDER_SITE_OTHER): Payer: Commercial Managed Care - PPO | Admitting: Family

## 2021-08-01 ENCOUNTER — Encounter: Payer: Self-pay | Admitting: Family

## 2021-08-01 VITALS — BP 122/83 | HR 92 | Temp 98.5°F | Ht 70.0 in | Wt 175.8 lb

## 2021-08-01 DIAGNOSIS — R197 Diarrhea, unspecified: Secondary | ICD-10-CM | POA: Diagnosis not present

## 2021-08-01 MED ORDER — DIPHENOXYLATE-ATROPINE 2.5-0.025 MG PO TABS: 1.0000 | ORAL_TABLET | Freq: Four times a day (QID) | ORAL | 0 refills | Status: DC | PRN

## 2021-08-01 NOTE — Progress Notes (Signed)
Patient ID: [name], male    DOB: Jun 25, 1965, 56 y.o.   MRN: [id number]  Chief Complaint  Patient presents with   Diarrhea    Pt c/o constant diarrhea since 7/15, Has tried imodium and Pepto bismol which did not help.Pt states he has been going none stop since, every 30 min's. Pt states he does not have any abdominal pain but is having some back pains.     HPI: Diarrhea:  reports loose bowels starting on Saturday, Friday took generic Viagra, started feeling bad that night and diarrhea started next morning.    Assessment & Plan:  1. Diarrhea, unspecified type Started Saturday morning after taking 79m of Viagra for the first time the night before. He reports eating buffalo wings Friday night, but no one else with same sx. Sending Lomotil, advised on use & SE, eat BRAT diet, hydrate with 2L water qd, RTO if sx are not improving after a few days.  - diphenoxylate-atropine (LOMOTIL) 2.5-0.025 MG tablet; Take 1-2 tablets by mouth 4 (four) times daily as needed for diarrhea or loose stools (Max dose of 8 pills in 24 hours).  Dispense: 30 tablet; Refill: 0   Subjective:    Outpatient Medications Prior to Visit  Medication Sig Dispense Refill   Blood Pressure KIT Use daily as needed to check blood pressure 1 kit 0   Continuous Blood Gluc Sensor (DEXCOM G6 SENSOR) MISC Change sensor every 10 days as directed 9 each 3   Continuous Blood Gluc Transmit (DEXCOM G6 TRANSMITTER) MISC Change transmitter every 90 days as directed. 1 each 3   esomeprazole (NEXIUM) 40 MG capsule TAKE (1) CAPSULE BY MOUTH ONCE DAILY. 90 capsule 0   ferrous sulfate (SLOW RELEASE IRON) 160 (50 Fe) MG TBCR SR tablet Take 1 tablet (160 mg total) by mouth 2 (two) times daily. 60 tablet 6   Insulin Disposable Pump (OMNIPOD 5 G6 INTRO, GEN 5,) KIT Change pod every 48-72 hrs 1 kit 0   Insulin Disposable Pump (OMNIPOD 5 G6 POD, GEN 5,) MISC Change pod every 48-72 hrs 3 each 11   insulin glargine-yfgn (SEMGLEE) 100 UNIT/ML  Pen Inject 18 Units into the skin at bedtime. 15 mL 3   insulin lispro (HUMALOG) 100 UNIT/ML injection Use with Omnipod for TDD around 35 units per day 60 mL 3   Insulin Pen Needle (BD PEN NEEDLE NANO U/F) 32G X 4 MM MISC USE TO INJECT INSULIN UP TO FOUR TIMES DAILY 100 each 0   irbesartan (AVAPRO) 300 MG tablet Take 1 tablet (300 mg total) by mouth daily. 90 tablet 3   sildenafil (REVATIO) 20 MG tablet Take 1-5 tablets (20-100 mg total) by mouth daily as needed (erectile dysfunction). 90 tablet 3   Vitamin D, Ergocalciferol, (DRISDOL) 1.25 MG (50000 UNIT) CAPS capsule Take 1 capsule (50,000 Units total) by mouth every 7 (seven) days. 12 capsule 0   No facility-administered medications prior to visit.   Past Medical History:  Diagnosis Date   Allergy    Anemia    Diabetes mellitus without complication (HCC)    GERD (gastroesophageal reflux disease)    HLD (hyperlipidemia)    Hypertension    Past Surgical History:  Procedure Laterality Date   ANKLE SURGERY Right    UPPER GASTROINTESTINAL ENDOSCOPY     VASECTOMY  1994   Allergies  Allergen Reactions   Crestor [Rosuvastatin Calcium] Other (See Comments)    Muscle aches   Iron Other (See Comments)    "  Tore up my stomach"  "Liquid Iron"   Lipitor [Atorvastatin Calcium] Other (See Comments)    Muscles aches   Statins Other (See Comments)    Muscle aches      Objective:    Physical Exam Vitals and nursing note reviewed.  Constitutional:      General: He is not in acute distress.    Appearance: Normal appearance.  HENT:     Head: Normocephalic.  Cardiovascular:     Rate and Rhythm: Normal rate and regular rhythm.  Pulmonary:     Effort: Pulmonary effort is normal.     Breath sounds: Normal breath sounds.  Musculoskeletal:        General: Normal range of motion.     Cervical back: Normal range of motion.  Skin:    General: Skin is warm and dry.  Neurological:     Mental Status: He is alert and oriented to person, place,  and time.  Psychiatric:        Mood and Affect: Mood normal.   BP 122/83 (BP Location: Left Arm, Patient Position: Sitting, Cuff Size: Large)   Pulse 92   Temp 98.5 F (36.9 C) (Temporal)   Ht '5\' 10"'  (1.778 m)   Wt 175 lb 12.8 oz (79.7 kg)   SpO2 95%   BMI 25.22 kg/m  Wt Readings from Last 3 Encounters:  08/01/21 175 lb 12.8 oz (79.7 kg)  06/20/21 183 lb (83 kg)  06/06/21 180 lb (81.6 kg)       Jeanie Sewer, NP

## 2021-08-01 NOTE — Patient Instructions (Signed)
It was very nice to see you today!  I have sent a medication to start to help slow down your diarrhea, take as directed on the bottle. Please increase your fluid intake. Hydration is key! Get plenty of rest.  If you feel up to eating solid foods, try to follow the dietary guide at the bottom of your After Visit Summary. These foods are gentler on the stomach. If you are unable to keep fluids down by mouth, or if symptoms acutely worsen, please go to the ER as you may need IV fluids.   Food Choices to Help Relieve Diarrhea, Adult When you have diarrhea, the foods you eat and your eating habits are very important. Choosing the right foods and drinks can help relieve diarrhea. Also, because diarrhea can last up to 7 days, you need to replace lost fluids and electrolytes (such as sodium, potassium, and chloride) in order to help prevent dehydration.   WHAT GENERAL GUIDELINES DO I NEED TO FOLLOW? Slowly drink 1 cup (8 oz) of fluid for each episode of diarrhea. If you are getting enough fluid, your urine will be clear or pale yellow. Eat starchy foods. Some good choices include white rice, white toast, pasta, low-fiber cereal, baked potatoes (without the skin), saltine crackers, and bagels. Avoid large servings of any cooked vegetables. Limit fruit to two servings per day. A serving is  cup or 1 small piece. Choose foods with less than 2 g of fiber per serving. Avoid fried foods. Eat foods that have probiotics in them. Probiotics can be found in certain dairy products. Avoid foods and beverages that may increase the speed at which food moves through the stomach and intestines (gastrointestinal tract). Things to avoid include: High-fiber foods, such as dried fruit, raw fruits and vegetables, nuts, seeds, and whole grain foods. Spicy foods and high-fat foods. Foods and beverages sweetened with high-fructose corn syrup, honey, or sugar alcohols such as xylitol, sorbitol, and mannitol.  WHAT FOODS ARE  RECOMMENDED? Grains White rice. White, [place], or pita breads (fresh or toasted), including plain rolls, buns, or bagels. White pasta. Saltine, soda, or graham crackers. Pretzels. Low-fiber cereal. Cooked cereals made with water (such as cornmeal, farina, or cream cereals). Plain muffins. Matzo. Melba toast. Zwieback.  Vegetables Potatoes (without the skin). Strained tomato and vegetable juices. Most well-cooked and canned vegetables without seeds. Tender lettuce. Fruits Cooked or canned applesauce, apricots, cherries, fruit cocktail, grapefruit, peaches, pears, or plums. Fresh bananas, apples without skin, cherries, grapes, cantaloupe, grapefruit, peaches, oranges, or plums.  Meat and Other Protein Products Baked or boiled chicken. Eggs. Tofu. Fish. Seafood. Smooth peanut butter. Ground or well-cooked tender beef, ham, veal, lamb, pork, or poultry.  Dairy Plain yogurt, kefir, and unsweetened liquid yogurt. Lactose-free milk, buttermilk, or soy milk. Plain hard cheese. Beverages Sport drinks. Clear broths. Diluted fruit juices (except prune). Regular, caffeine-free sodas such as ginger ale. Water. Decaffeinated teas. Oral rehydration solutions. Sugar-free beverages not sweetened with sugar alcohols. Other Bouillon, broth, or soups made from recommended foods.  The items listed above may not be a complete list of recommended foods or beverages. Contact your dietitian for more options.  WHAT SHOULD I DO IF I BECOME DEHYDRATED? Diarrhea can sometimes lead to dehydration. Signs of dehydration include dark urine and dry mouth and skin. If you think you are dehydrated, you should rehydrate with an oral rehydration solution. These solutions can be purchased at pharmacies, retail stores, or online.  Drink -1 cup (120-240 mL) of oral rehydration solution  each time you have an episode of diarrhea. If drinking this amount makes your diarrhea worse, try drinking smaller amounts more often. For example, drink  1-3 tsp (5-15 mL) every 5-10 minutes.  A general rule for staying hydrated is to drink 1-2 L of fluid per day. Talk to your health care provider about the specific amount you should be drinking each day. Drink enough fluids to keep your urine clear or pale yellow.

## 2021-08-05 MED ORDER — OMNIPOD 5 DEXG7G6 PODS GEN 5 MISC: 6 refills | Status: DC

## 2021-08-05 NOTE — Addendum Note (Signed)
Addended by: Brita Romp on: 08/05/2021 07:21 AM   Modules accepted: Orders

## 2021-08-11 ENCOUNTER — Other Ambulatory Visit: Payer: Self-pay | Admitting: Family Medicine

## 2021-08-29 MED ORDER — INSULIN LISPRO 100 UNIT/ML IJ SOLN: INTRAMUSCULAR | 3 refills | Status: DC

## 2021-08-29 MED ORDER — DEXCOM G6 SENSOR MISC: 3 refills | Status: DC

## 2021-08-29 MED ORDER — DEXCOM G6 TRANSMITTER MISC: 3 refills | Status: DC

## 2021-08-29 MED ORDER — OMNIPOD 5 DEXG7G6 PODS GEN 5 MISC: 6 refills | Status: DC

## 2021-08-29 NOTE — Telephone Encounter (Signed)
Says he sent the reply to me... can you also see it?

## 2021-08-29 NOTE — Telephone Encounter (Signed)
Here is his response

## 2021-08-29 NOTE — Addendum Note (Signed)
Addended by: Brita Romp on: 08/29/2021 07:30 AM   Modules accepted: Orders

## 2021-09-11 ENCOUNTER — Other Ambulatory Visit: Payer: Self-pay

## 2021-09-11 ENCOUNTER — Emergency Department (HOSPITAL_COMMUNITY): Payer: Commercial Managed Care - PPO

## 2021-09-11 ENCOUNTER — Emergency Department (HOSPITAL_COMMUNITY)
Admission: EM | Admit: 2021-09-11 | Discharge: 2021-09-11 | Disposition: A | Discharge status: Home / Self Care | Payer: Commercial Managed Care - PPO | Attending: Emergency Medicine | Admitting: Emergency Medicine

## 2021-09-11 ENCOUNTER — Encounter (HOSPITAL_COMMUNITY): Payer: Self-pay

## 2021-09-11 DIAGNOSIS — M79651 Pain in right thigh: Secondary | ICD-10-CM | POA: Diagnosis not present

## 2021-09-11 DIAGNOSIS — M25551 Pain in right hip: Secondary | ICD-10-CM | POA: Insufficient documentation

## 2021-09-11 DIAGNOSIS — Z794 Long term (current) use of insulin: Secondary | ICD-10-CM | POA: Insufficient documentation

## 2021-09-11 DIAGNOSIS — M5431 Sciatica, right side: Secondary | ICD-10-CM | POA: Insufficient documentation

## 2021-09-11 LAB — CBG MONITORING, ED: Glucose-Capillary: 139 mg/dL — ABNORMAL HIGH (ref 70–99)

## 2021-09-11 MED ORDER — METHOCARBAMOL 500 MG PO TABS
1000.0000 mg | ORAL_TABLET | Freq: Once | ORAL | Status: AC
Start: 2021-09-11 — End: 2021-09-11
  Administered 2021-09-11: 1000 mg via ORAL
  Filled 2021-09-11: qty 2

## 2021-09-11 MED ORDER — IBUPROFEN 400 MG PO TABS
600.0000 mg | ORAL_TABLET | Freq: Once | ORAL | Status: AC
  Administered 2021-09-11: 600 mg via ORAL
  Filled 2021-09-11: qty 2

## 2021-09-11 MED ORDER — IBUPROFEN 600 MG PO TABS: 600.0000 mg | ORAL_TABLET | Freq: Three times a day (TID) | ORAL | 0 refills | Status: DC | PRN

## 2021-09-11 MED ORDER — METHOCARBAMOL 500 MG PO TABS: 1000.0000 mg | ORAL_TABLET | Freq: Three times a day (TID) | ORAL | 0 refills | Status: AC | PRN

## 2021-09-11 NOTE — ED Provider Triage Note (Signed)
Emergency Medicine Provider Triage Evaluation Note  [name] , a 56 y.o. male  was evaluated in triage.  Pt complains of right hip/buttocks pain.  Had a fall a few months ago where he injured that hip and his head.  Some discomfort in his hip ever since as well as his buttocks.  However tonight it is acutely worse.  Pain seems to radiate down the back of his leg, to his knee but also sometimes further down.  His leg feels tight and numb.  No bowel/bladder incontinence..  Review of Systems  Positive: Right leg/buttock pain Negative: Bowel/bladder incontinence  Physical Exam  BP (!) 144/94 (BP Location: Right Arm)   Pulse 72   Temp 98 F (36.7 C)   Resp 18   Ht '5\' 10"'$  (1.778 m)   Wt 81.6 kg   SpO2 100%   BMI 25.83 kg/m  Gen:   Awake, no distress   Resp:  Normal effort MSK:   Mild tenderness over right greater trochanter.  Able to flex hip without difficulty though extending the hip induces pain.  Intact strength and sensation in the right lower extremity though a little limited due to pain.   Medical Decision Making  Medically screening exam initiated at 10:19 PM.  Appropriate orders placed.  [name] was informed that the remainder of the evaluation will be completed by another provider, this initial triage assessment does not replace that evaluation, and the importance of remaining in the ED until their evaluation is complete.  Xray hip pending. Will give ibuprofen for pain (doesn't want anything else currently).    Sherwood Gambler, MD 09/11/21 2225

## 2021-09-11 NOTE — Discharge Instructions (Addendum)
If you develop worsening, recurrent, or continued back pain, numbness or weakness in the legs, incontinence of your bowels or bladders, numbness of your buttocks, fever, abdominal pain, or any other new/concerning symptoms then return to the ER for evaluation.  

## 2021-09-11 NOTE — ED Provider Notes (Addendum)
[facility] EMERGENCY DEPARTMENT Provider Note   CSN: [id number] Arrival date & time: 09/11/21  2053     History  Chief Complaint  Patient presents with   Hip Pain    [name] is a 56 y.o. male.  HPI 56 year old male presents with right buttocks pain.  In June he slipped and fell and injured his hip and head.  Since then has been dealing with some on and off pain, especially if he sits down he will notice some pain in his right buttocks/lateral hip.  However tonight after walking down an uneven path he developed a sudden jolt of pain.  The pain is much worse than its been.  He feels like his leg has become swollen though its not visually swollen.  Pain is primarily near his greater trochanter.  He did not have any direct trauma this time.  Maybe some tingling but no numbness or weakness.  No incontinence or fevers.  No back pain.  Has not taken anything for the pain.  Home Medications Prior to Admission medications   Medication Sig Start Date End Date Taking? Authorizing Provider  ibuprofen (ADVIL) 600 MG tablet Take 1 tablet (600 mg total) by mouth every 8 (eight) hours as needed. 09/11/21  Yes Sherwood Gambler, MD  methocarbamol (ROBAXIN) 500 MG tablet Take 2 tablets (1,000 mg total) by mouth every 8 (eight) hours as needed for up to 5 days for muscle spasms. 09/11/21 09/16/21 Yes Sherwood Gambler, MD  Blood Pressure KIT Use daily as needed to check blood pressure 04/25/21   Vivi Barrack, MD  Continuous Blood Gluc Sensor (DEXCOM G6 SENSOR) MISC Change sensor every 10 days as directed 08/29/21   Brita Romp, NP  Continuous Blood Gluc Transmit (DEXCOM G6 TRANSMITTER) MISC Change transmitter every 90 days as directed. 08/29/21   Brita Romp, NP  diphenoxylate-atropine (LOMOTIL) 2.5-0.025 MG tablet Take 1-2 tablets by mouth 4 (four) times daily as needed for diarrhea or loose stools (Max dose of 8 pills in 24 hours). 08/01/21   Jeanie Sewer, NP  esomeprazole (NEXIUM) 40  MG capsule TAKE (1) CAPSULE BY MOUTH ONCE DAILY. 08/11/21   Vivi Barrack, MD  ferrous sulfate (SLOW RELEASE IRON) 160 (50 Fe) MG TBCR SR tablet Take 1 tablet (160 mg total) by mouth 2 (two) times daily. 11/13/17   Irene Shipper, MD  Insulin Disposable Pump (OMNIPOD 5 G6 INTRO, GEN 5,) KIT Change pod every 48-72 hrs 06/07/21   Brita Romp, NP  Insulin Disposable Pump (OMNIPOD 5 G6 POD, GEN 5,) MISC Change pod every 48-72 hrs 08/29/21   Brita Romp, NP  insulin glargine-yfgn (SEMGLEE) 100 UNIT/ML Pen Inject 18 Units into the skin at bedtime. 06/06/21   Brita Romp, NP  insulin lispro (HUMALOG) 100 UNIT/ML injection Use with Omnipod for TDD around 35 units per day 08/29/21   Brita Romp, NP  Insulin Pen Needle (BD PEN NEEDLE NANO U/F) 32G X 4 MM MISC USE TO INJECT INSULIN UP TO FOUR TIMES DAILY 06/06/21   Vivi Barrack, MD  irbesartan (AVAPRO) 300 MG tablet Take 1 tablet (300 mg total) by mouth daily. 02/02/21   Vivi Barrack, MD  sildenafil (REVATIO) 20 MG tablet Take 1-5 tablets (20-100 mg total) by mouth daily as needed (erectile dysfunction). 08/04/19   Vivi Barrack, MD  Vitamin D, Ergocalciferol, (DRISDOL) 1.25 MG (50000 UNIT) CAPS capsule Take 1 capsule (50,000 Units total) by mouth every 7 (seven)  days. 02/07/21   Vivi Barrack, MD      Allergies    Crestor [rosuvastatin calcium], Iron, Lipitor [atorvastatin calcium], and Statins    Review of Systems   Review of Systems  Constitutional:  Negative for fever.  Musculoskeletal:  Positive for arthralgias. Negative for back pain.  Neurological:  Negative for weakness and numbness.    Physical Exam Updated Vital Signs BP (!) 144/94 (BP Location: Right Arm)   Pulse 72   Temp 98 F (36.7 C)   Resp 18   Ht _0  (1.778 m)   Wt 81.6 kg   SpO2 100%   BMI 25.83 kg/m  Physical Exam Vitals and nursing note reviewed.  Constitutional:      Appearance: He is well-developed.  HENT:     Head: Normocephalic and  atraumatic.  Cardiovascular:     Rate and Rhythm: Normal rate and regular rhythm.     Pulses:          Posterior tibial pulses are 2+ on the right side.  Pulmonary:     Effort: Pulmonary effort is normal.     Breath sounds: Normal breath sounds.  Abdominal:     General: There is no distension.  Musculoskeletal:     Lumbar back: No tenderness.     Right hip: Tenderness present. No deformity. Decreased range of motion.  Skin:    General: Skin is warm and dry.  Neurological:     Mental Status: He is alert.     Comments: 5/5 strength in both lower extremities though a little limited in the right lower extremity due to pain.  Grossly normal sensation.     ED Results / Procedures / Treatments   Labs (all labs ordered are listed, but only abnormal results are displayed) Labs Reviewed  CBG MONITORING, ED - Abnormal; Notable for the following components:      Result Value   Glucose-Capillary 139 (*)    All other components within normal limits    EKG None  Radiology DG HIP UNILAT WITH PELVIS 2-3 VIEWS RIGHT  Result Date: 09/11/2021 CLINICAL DATA:  Right hip pain. EXAM: DG HIP (WITH OR WITHOUT PELVIS) 2-3V RIGHT COMPARISON:  None Available. FINDINGS: There is no evidence of hip fracture or dislocation. Mild to moderate severity degenerative changes are seen in the form of joint space narrowing and acetabular sclerosis. IMPRESSION: Degenerative changes without an acute osseous abnormality. Electronically Signed   By: Virgina Norfolk M.D.   On: 09/11/2021 22:25    Procedures Procedures    Medications Ordered in ED Medications  methocarbamol (ROBAXIN) tablet 1,000 mg (has no administration in time range)  ibuprofen (ADVIL) tablet 600 mg (600 mg Oral Given 09/11/21 2233)    ED Course/ Medical Decision Making/ A&P                           Medical Decision Making Risk Prescription drug management.   X-ray of his right hip/pelvis viewed/interpreted by myself and there is no  fracture or dislocation of the hip.  No pelvis fracture.  CBG is okay.  This sounds like sciatica, especially given left leg raise induces pain.  I discussed options with patient, he does not I think too strong for pain so we will do NSAIDs, muscle relaxer, and topical therapy.  He does not have any red flags at this time though we discussed these and counseled on return precautions.  Will refer to orthopedics.  No back pain currently.  Of note, I have pretty low suspicion for DVT.  No calf or thigh swelling on exam.  No tenderness to these areas.       Final Clinical Impression(s) / ED Diagnoses Final diagnoses:  Sciatica of right side    Rx / DC Orders ED Discharge Orders          Ordered    ibuprofen (ADVIL) 600 MG tablet  Every 8 hours PRN        09/11/21 2250    methocarbamol (ROBAXIN) 500 MG tablet  Every 8 hours PRN        09/11/21 2250              Sherwood Gambler, MD 09/11/21 1859    Sherwood Gambler, MD 09/11/21 2255

## 2021-09-11 NOTE — ED Triage Notes (Addendum)
Pt presents with right hip pain after bending down to pick something up today. Pt not able to walk without assistance. Pt fell back in June on same hip but it improved and pt was never seen for it.

## 2021-09-12 ENCOUNTER — Encounter: Payer: Self-pay | Admitting: Family Medicine

## 2021-09-12 ENCOUNTER — Telehealth: Payer: Self-pay | Admitting: Family Medicine

## 2021-09-12 ENCOUNTER — Ambulatory Visit (INDEPENDENT_AMBULATORY_CARE_PROVIDER_SITE_OTHER): Payer: Commercial Managed Care - PPO | Admitting: Nurse Practitioner

## 2021-09-12 ENCOUNTER — Encounter: Payer: Self-pay | Admitting: Nurse Practitioner

## 2021-09-12 VITALS — BP 154/97 | HR 89 | Ht 70.0 in | Wt 180.8 lb

## 2021-09-12 DIAGNOSIS — E109 Type 1 diabetes mellitus without complications: Secondary | ICD-10-CM | POA: Diagnosis not present

## 2021-09-12 DIAGNOSIS — I10 Essential (primary) hypertension: Secondary | ICD-10-CM | POA: Diagnosis not present

## 2021-09-12 DIAGNOSIS — E782 Mixed hyperlipidemia: Secondary | ICD-10-CM | POA: Diagnosis not present

## 2021-09-12 LAB — POCT GLYCOSYLATED HEMOGLOBIN (HGB A1C): Hemoglobin A1C: 7.7 % — AB (ref 4.0–5.6)

## 2021-09-12 NOTE — Progress Notes (Signed)
Endocrinology Follow Up Note       09/12/2021, 6:10 PM   Subjective:    Patient ID: [name], male    DOB: Mar 17, 1965.  [name] is being seen in follow up after being seen in consultation for management of currently uncontrolled symptomatic diabetes requested by  Vivi Barrack, MD.   Past Medical History:  Diagnosis Date   Allergy    Anemia    Diabetes mellitus without complication (HCC)    GERD (gastroesophageal reflux disease)    HLD (hyperlipidemia)    Hypertension     Past Surgical History:  Procedure Laterality Date   ANKLE SURGERY Right    UPPER GASTROINTESTINAL ENDOSCOPY     VASECTOMY  1994    Social History   Socioeconomic History   Marital status: Married    Spouse name: Not on file   Number of children: 1   Years of education: Not on file   Highest education level: Not on file  Occupational History   Occupation: Architect  Tobacco Use   Smoking status: Every Day    Packs/day: 1.00    Types: Cigarettes   Smokeless tobacco: Never  Vaping Use   Vaping Use: Never used  Substance and Sexual Activity   Alcohol use: Yes    Alcohol/week: 8.0 standard drinks of alcohol    Types: 8 Cans of beer per week    Comment: occ   Drug use: No   Sexual activity: Yes  Other Topics Concern   Not on file  Social History Narrative   Not on file   Social Determinants of Health   Financial Resource Strain: Not on file  Food Insecurity: Not on file  Transportation Needs: Not on file  Physical Activity: Not on file  Stress: Not on file  Social Connections: Not on file    Family History  Problem Relation Age of Onset   COPD Mother    Diabetes Mother    Early death Father 73       MI   Heart attack Father    Lymphoma Sister 60   Hypertension Brother    Diabetes Maternal Grandmother    Stroke Neg Hx    Alcohol abuse Neg Hx    Drug abuse Neg Hx    Hyperlipidemia Neg Hx     Kidney disease Neg Hx    Colon cancer Neg Hx    Esophageal cancer Neg Hx    Stomach cancer Neg Hx    Rectal cancer Neg Hx     Outpatient Encounter Medications as of 09/12/2021  Medication Sig   Continuous Blood Gluc Sensor (DEXCOM G6 SENSOR) MISC Change sensor every 10 days as directed   Continuous Blood Gluc Transmit (DEXCOM G6 TRANSMITTER) MISC Change transmitter every 90 days as directed.   esomeprazole (NEXIUM) 40 MG capsule TAKE (1) CAPSULE BY MOUTH ONCE DAILY.   ibuprofen (ADVIL) 600 MG tablet Take 1 tablet (600 mg total) by mouth every 8 (eight) hours as needed.   Insulin Disposable Pump (OMNIPOD 5 G6 INTRO, GEN 5,) KIT Change pod every 48-72 hrs   Insulin Disposable Pump (OMNIPOD 5 G6 POD, GEN 5,) MISC Change pod every  48-72 hrs   insulin lispro (HUMALOG) 100 UNIT/ML injection Use with Omnipod for TDD around 35 units per day   irbesartan (AVAPRO) 300 MG tablet Take 1 tablet (300 mg total) by mouth daily.   methocarbamol (ROBAXIN) 500 MG tablet Take 2 tablets (1,000 mg total) by mouth every 8 (eight) hours as needed for up to 5 days for muscle spasms.   sildenafil (REVATIO) 20 MG tablet Take 1-5 tablets (20-100 mg total) by mouth daily as needed (erectile dysfunction).   Blood Pressure KIT Use daily as needed to check blood pressure (Patient not taking: Reported on 09/12/2021)   Vitamin D, Ergocalciferol, (DRISDOL) 1.25 MG (50000 UNIT) CAPS capsule Take 1 capsule (50,000 Units total) by mouth every 7 (seven) days. (Patient not taking: Reported on 09/12/2021)   [DISCONTINUED] diphenoxylate-atropine (LOMOTIL) 2.5-0.025 MG tablet Take 1-2 tablets by mouth 4 (four) times daily as needed for diarrhea or loose stools (Max dose of 8 pills in 24 hours).   [DISCONTINUED] ferrous sulfate (SLOW RELEASE IRON) 160 (50 Fe) MG TBCR SR tablet Take 1 tablet (160 mg total) by mouth 2 (two) times daily.   [DISCONTINUED] insulin glargine-yfgn (SEMGLEE) 100 UNIT/ML Pen Inject 18 Units into the skin at bedtime.  (Patient not taking: Reported on 09/12/2021)   [DISCONTINUED] Insulin Pen Needle (BD PEN NEEDLE NANO U/F) 32G X 4 MM MISC USE TO INJECT INSULIN UP TO FOUR TIMES DAILY   No facility-administered encounter medications on file as of 09/12/2021.    ALLERGIES: Allergies  Allergen Reactions   Crestor [Rosuvastatin Calcium] Other (See Comments)    Muscle aches   Iron Other (See Comments)    "Tore up my stomach"  "Liquid Iron"   Lipitor [Atorvastatin Calcium] Other (See Comments)    Muscles aches   Statins Other (See Comments)    Muscle aches    VACCINATION STATUS: Immunization History  Administered Date(s) Administered   Influenza,inj,Quad PF,6+ Mos 01/18/2016, 10/17/2017, 11/06/2019, 10/28/2020   Influenza,inj,quad, With Preservative 10/30/2018   Influenza-Unspecified 10/17/2017   PFIZER(Purple Top)SARS-COV-2 Vaccination 04/10/2019, 05/05/2019   Pneumococcal Polysaccharide-23 10/21/2014   Tdap 06/02/2013    Diabetes He presents for his follow-up diabetic visit. He has type 1 diabetes mellitus. Onset time: Diagnosed with LADA at age of 56. His disease course has been fluctuating. There are no hypoglycemic associated symptoms. Associated symptoms include blurred vision and fatigue. There are no hypoglycemic complications. Symptoms are stable. There are no diabetic complications. Risk factors for coronary artery disease include diabetes mellitus, dyslipidemia, family history, male sex and tobacco exposure. Current diabetic treatment includes insulin pump. He is compliant with treatment most of the time. His weight is stable. He is following a generally healthy diet. When asked about meal planning, he reported none. He has not had a previous visit with a dietitian. He participates in exercise daily. His home blood glucose trend is decreasing steadily. His overall blood glucose range is >200 mg/dl. (He presents today, accompanied by his wife, with his CGM and Omnipod 5 combo showing at goal fasting  and above target postprandial readings.  His POCT A1c today is 7.7%, increasing slightly from last visit of 7.1%.  He notes his Omnipod switches from automated mode to manual mode every day without warning.  I also noticed he may not be bolusing enough during meals.  Analysis of his CGM shows TIR 43%, TAR 57%, TBR 0% with a GMI of 8.2%.) An ACE inhibitor/angiotensin II receptor blocker is being taken. He does not see a podiatrist.Eye exam is  not current.     Review of systems  Constitutional: + Minimally fluctuating body weight,  current Body mass index is 25.94 kg/m. , no fatigue, no subjective hyperthermia, no subjective hypothermia Eyes: no blurry vision, no xerophthalmia ENT: no sore throat, no nodules palpated in throat, no dysphagia/odynophagia, no hoarseness Cardiovascular: no chest pain, no shortness of breath, no palpitations, no leg swelling Respiratory: no cough, no shortness of breath Gastrointestinal: no nausea/vomiting/diarrhea Musculoskeletal: severe back pain- seeing his PCP for this Skin: no rashes, no hyperemia Neurological: no tremors, no numbness, no tingling, no dizziness Psychiatric: no depression, no anxiety  Objective:     BP (!) 154/97 (BP Location: Right Arm, Patient Position: Sitting, Cuff Size: Large)   Pulse 89   Ht '5\' 10"'  (1.778 m)   Wt 180 lb 12.8 oz (82 kg)   BMI 25.94 kg/m   Wt Readings from Last 3 Encounters:  09/12/21 180 lb 12.8 oz (82 kg)  09/11/21 180 lb (81.6 kg)  08/01/21 175 lb 12.8 oz (79.7 kg)     BP Readings from Last 3 Encounters:  09/12/21 (!) 154/97  09/11/21 (!) 144/94  08/01/21 122/83      Physical Exam- Limited  Constitutional:  Body mass index is 25.94 kg/m. , not in acute distress, normal state of mind Eyes:  EOMI, no exophthalmos Neck: Supple Cardiovascular: RRR, no murmurs, rubs, or gallops, no edema Respiratory: Adequate breathing efforts, no crackles, rales, rhonchi, or wheezing Musculoskeletal: no gross  deformities, strength intact in all four extremities, no gross restriction of joint movements Skin:  no rashes, no hyperemia Neurological: no tremor with outstretched hands   Diabetic Foot Exam - Simple   No data filed     CMP ( most recent) CMP     Component Value Date/Time   NA 135 06/02/2021 0625   K 4.7 06/02/2021 0625   CL 105 06/02/2021 0625   CO2 18 (L) 06/02/2021 0625   GLUCOSE 170 (H) 06/02/2021 0625   BUN 15 06/02/2021 0625   CREATININE 1.03 06/02/2021 0625   CALCIUM 8.7 (L) 06/02/2021 0625   PROT 7.3 02/02/2021 1046   ALBUMIN 4.1 02/02/2021 1046   AST 23 02/02/2021 1046   ALT 18 02/02/2021 1046   ALKPHOS 111 02/02/2021 1046   BILITOT 0.2 02/02/2021 1046   GFRNONAA >60 06/02/2021 0625   GFRAA >60 10/10/2017 0542     Diabetic Labs (most recent): Lab Results  Component Value Date   HGBA1C 7.7 (A) 09/12/2021   HGBA1C 7.1 (A) 04/25/2021   HGBA1C 8.7 (H) 02/02/2021   MICROALBUR 150 06/20/2021   MICROALBUR 17.0 (H) 01/18/2016     Lipid Panel ( most recent) Lipid Panel     Component Value Date/Time   CHOL 241 (H) 10/28/2020 0943   TRIG 77.0 10/28/2020 0943   HDL 90.50 10/28/2020 0943   CHOLHDL 3 10/28/2020 0943   VLDL 15.4 10/28/2020 0943   LDLCALC 135 (H) 10/28/2020 0943   LDLDIRECT 156.0 01/18/2016 1020      Lab Results  Component Value Date   TSH 1.78 02/02/2021   TSH 1.65 10/28/2020   TSH 2.15 01/18/2016           Assessment & Plan:   1) Type 1 diabetes mellitus without complication ([place])  He presents today, accompanied by his wife, with his CGM and Omnipod 5 combo showing at goal fasting and above target postprandial readings.  His POCT A1c today is 7.7%, increasing slightly from last visit of 7.1%.  He  notes his Omnipod switches from automated mode to manual mode every day without warning.  I also noticed he may not be bolusing enough during meals.  Analysis of his CGM shows TIR 43%, TAR 57%, TBR 0% with a GMI of 8.2%.  - WAINO MOUNSEY has currently uncontrolled symptomatic LADA DM since 56 years of age.   -Recent labs reviewed.  - I had a long discussion with him about the progressive nature of diabetes and the pathology behind its complications. -his diabetes is complicated by drastic fluctuations and severe hypoglycemia and he remains at a high risk for more acute and chronic complications which include CAD, CVA, CKD, retinopathy, and neuropathy. These are all discussed in detail with him.  The following Lifestyle Medicine recommendations according to [facility]) were discussed and offered to patient and he agrees to start the journey:  A. Whole Foods, Plant-based plate comprising of fruits and vegetables, plant-based proteins, whole-grain carbohydrates was discussed in detail with the patient.   A list for source of those nutrients were also provided to the patient.  Patient will use only water or unsweetened tea for hydration. B.  The need to stay away from risky substances including alcohol, smoking; obtaining 7 to 9 hours of restorative sleep, at least 150 minutes of moderate intensity exercise weekly, the importance of healthy social connections,  and stress reduction techniques were discussed. C.  A full color page of  Calorie density of various food groups per pound showing examples of each food groups was provided to the patient.  - Nutritional counseling repeated at each appointment due to patients tendency to fall back in to old habits.  - The patient admits there is a room for improvement in their diet and drink choices. -  Suggestion is made for the patient to avoid simple carbohydrates from their diet including Cakes, Sweet Desserts / Pastries, Ice Cream, Soda (diet and regular), Sweet Tea, Candies, Chips, Cookies, Sweet Pastries, Store Bought Juices, Alcohol in Excess of 1-2 drinks a day, Artificial Sweeteners, Coffee Creamer, and "Sugar-free" Products. This will help patient  to have stable blood glucose profile and potentially avoid unintended weight Golden.   - I encouraged the patient to switch to unprocessed or minimally processed complex starch and increased protein intake (animal or plant source), fruits, and vegetables.   - Patient is advised to stick to a routine mealtimes to eat 3 meals a day and avoid unnecessary snacks (to snack only to correct hypoglycemia).  - I have approached him with the following individualized plan to manage his diabetes and patient agrees:   -I did make an adjustment to his insulin carb ratio on his Omnipod 5 pump.  I changed him from 1:15 carbs to 1: 10 to help control his postprandial hyperglycemia.  I also advised him to reach out to [facility] for assistance with why his pump keeps switching to manual mode.    -he is encouraged to continue monitoring glucose 4 times daily (using his CGM), before meals and before bed, and to call the clinic if he has readings less than 70 or above 300 for 3 tests in a row.    - he is warned not to take insulin without proper monitoring per orders. - Adjustment parameters are given to him for hypo and hyperglycemia in writing.  - his Metformin will be discontinued, risk outweighs benefit for this patient, given his type 1 diagnosis where insulin treatment is necessary.  - Specific targets  for  A1c; LDL, HDL, and Triglycerides were discussed with the patient.  2) Blood Pressure /Hypertension:  his blood pressure is controlled to target.   he is advised to continue his current medications including Irbesartan 300 mg p.o. daily with breakfast.  3) Lipids/Hyperlipidemia:    Review of his recent lipid panel from 10/28/20 showed uncontrolled LDL at 135 . He is allergic to statin medications.  He is advised to avoid fried foods and butter.  4)  Weight/Diet:  his Body mass index is 25.94 kg/m.  -    he is NOT a candidate for weight loss.   Exercise, and detailed carbohydrates information provided  -   detailed on discharge instructions.  5) Chronic Care/Health Maintenance: -he is on ACEI/ARB and not on Statin medications and is encouraged to initiate and continue to follow up with Ophthalmology, Dentist, Podiatrist at least yearly or according to recommendations, and advised to [place]. I have recommended yearly flu vaccine and pneumonia vaccine at least every 5 years; moderate intensity exercise for up to 150 minutes weekly; and sleep for at least 7 hours a day.  - he is advised to maintain close follow up with Vivi Barrack, MD for primary care needs, as well as his other providers for optimal and coordinated care.       I spent 40 minutes in the care of the patient today including review of labs from [place], Lipids, Thyroid Function, Hematology (current and previous including abstractions from other facilities); face-to-face time discussing  his blood glucose readings/logs, discussing hypoglycemia and hyperglycemia episodes and symptoms, medications doses, his options of short and long term treatment based on the latest standards of care / guidelines;  discussion about incorporating lifestyle medicine;  and documenting the encounter. Risk reduction counseling performed per USPSTF guidelines to reduce obesity and cardiovascular risk factors.     Please refer to Patient Instructions for Blood Glucose Monitoring and Insulin/Medications Dosing Guide"  in media tab for additional information. Please  also refer to " Patient Self Inventory" in the Media  tab for reviewed elements of pertinent patient history.  [name] participated in the discussions, expressed understanding, and voiced agreement with the above plans.  All questions were answered to his satisfaction. he is encouraged to contact clinic should he have any questions or concerns prior to his return visit.     Follow up plan: - Return in about 3 months (around 12/13/2021) for Diabetes F/U with A1c in office, No previsit  labs, Bring meter and logs.    Rayetta Pigg, [facility] [address] [place], [place] [address] Phone: [phone] Fax: [phone]  09/12/2021, 6:10 PM

## 2021-09-12 NOTE — Telephone Encounter (Signed)
See note

## 2021-09-12 NOTE — Telephone Encounter (Signed)
Patient states he hurt his back and went to the ED last night (09/11/21). Patient states he did not want to take any pain medications until he saw his Endocrinologist to discuss what would be a safe medication. Patient states Endocrinologist advised Patient to let Dr. [name] know that Patient can take Tramadol.  Patient requests RX for Tramadol be sent to: [facility] [id number] - [place], [address] Phone:  [phone]  Fax:  [phone]

## 2021-09-13 ENCOUNTER — Encounter: Payer: Self-pay | Admitting: Family Medicine

## 2021-09-13 NOTE — Telephone Encounter (Signed)
Recommend office visit with me. I can't send in controlled medications without an office visit.  [name]. [name], MD 09/13/2021 8:56 AM

## 2021-09-13 NOTE — Telephone Encounter (Signed)
Caller states: -She was calling for an update on message started below. I read patient message below and she verbalized understanding.  - Patient is in a lot of pain and would possibly need an MRI to see where pain is coming from   I informed patient that an opening with PCP isn't available until 08/31. I was able to schedule pt a ED follow up with Dr. Cherlynn Kaiser on 08/30 @ 8am to address concerns in a quicker manner.   Informed caller that if pain worsens, patient will need to return to ED per ED after visit instructions. Pt verbalized understanding.

## 2021-09-14 ENCOUNTER — Ambulatory Visit: Payer: Commercial Managed Care - PPO | Admitting: Family Medicine

## 2021-09-15 ENCOUNTER — Encounter: Payer: Self-pay | Admitting: Nurse Practitioner

## 2021-09-15 NOTE — Telephone Encounter (Signed)
Please see pt note and advise

## 2021-09-15 NOTE — Telephone Encounter (Signed)
Patient was scheduled with Cherlynn Kaiser on 8/30 and cancelled appt

## 2021-09-16 NOTE — Telephone Encounter (Signed)
We can send in gabapentin '300mg'$  nightly but recommend he schedule a follow up visit here if he is still having issues.  [name]. [name], MD 09/16/2021 1:05 PM

## 2021-09-20 ENCOUNTER — Ambulatory Visit: Payer: Commercial Managed Care - PPO | Admitting: Nurse Practitioner

## 2021-09-22 MED ORDER — INSULIN LISPRO 100 UNIT/ML IJ SOLN: INTRAMUSCULAR | 3 refills | Status: DC

## 2021-09-22 NOTE — Telephone Encounter (Signed)
Type of form received: [place]  Additional comments: Caller declined appointment unless requested by PCP Team  Caller states: -pt is in a lot of pain and getting in and out of a car for an appointment will be difficult.  Received by: [facility]  Form should be Faxed to: [facility]   Form placed:   With [employer] for any needed follow up  Attach charge sheet.  y  Individual made aware of 3-5 business day turn around (Y/N)?  n

## 2021-09-23 ENCOUNTER — Telehealth: Payer: Self-pay | Admitting: Family Medicine

## 2021-09-23 NOTE — Telephone Encounter (Signed)
Wife dropped clearance form for surgery on 09/22/21 - Patient called today 09/23/21 asking status of form.   Form is under Dr Ellwood Handler care awaiting completion. Patient aware he may need an appointment for clearance form completion.

## 2021-09-23 NOTE — Telephone Encounter (Signed)
Spoke to [facility] - sx is not scheduled yet. Cannot be scheduled until following week. Tho Dr Rolena Infante wants Sx done ASAP is not an emergency. Per surgical team at [facility], sx can wait.  After speaking with [facility], patient was contacted. Agrees to see Dr [name] during a virtual apt on 09/26/21.

## 2021-09-26 ENCOUNTER — Encounter: Payer: Self-pay | Admitting: Family Medicine

## 2021-09-26 ENCOUNTER — Telehealth (INDEPENDENT_AMBULATORY_CARE_PROVIDER_SITE_OTHER): Payer: Commercial Managed Care - PPO | Admitting: Family Medicine

## 2021-09-26 VITALS — Ht 70.0 in | Wt 180.0 lb

## 2021-09-26 DIAGNOSIS — E1065 Type 1 diabetes mellitus with hyperglycemia: Secondary | ICD-10-CM | POA: Diagnosis not present

## 2021-09-26 DIAGNOSIS — E1159 Type 2 diabetes mellitus with other circulatory complications: Secondary | ICD-10-CM | POA: Diagnosis not present

## 2021-09-26 DIAGNOSIS — I152 Hypertension secondary to endocrine disorders: Secondary | ICD-10-CM | POA: Diagnosis not present

## 2021-09-26 DIAGNOSIS — K219 Gastro-esophageal reflux disease without esophagitis: Secondary | ICD-10-CM

## 2021-09-26 MED ORDER — ESOMEPRAZOLE MAGNESIUM 40 MG PO CPDR
40.0000 mg | DELAYED_RELEASE_CAPSULE | Freq: Every day | ORAL | 3 refills | Status: DC
Start: 2021-09-26 — End: 2022-06-27

## 2021-09-26 MED ORDER — IRBESARTAN 300 MG PO TABS: 300.0000 mg | ORAL_TABLET | Freq: Every day | ORAL | 3 refills | Status: DC

## 2021-09-26 NOTE — Assessment & Plan Note (Signed)
Home blood pressure readings have been at goal.  We will refill his irbesartan.

## 2021-09-26 NOTE — Telephone Encounter (Signed)
Patient had VV with PCP form was faxed today to Fax [phone]

## 2021-09-26 NOTE — Assessment & Plan Note (Signed)
Stable on Nexium.  Refill today.

## 2021-09-26 NOTE — Telephone Encounter (Signed)
Patient had VV today with PCP

## 2021-09-26 NOTE — Assessment & Plan Note (Signed)
Managed by endocrinology.  Currently on insulin pump which seems to be working well.  Last A1c was 7.7.

## 2021-09-26 NOTE — Progress Notes (Signed)
   [name] is a 56 y.o. male who presents today for a virtual office visit.  Assessment/Plan:  New/Acute Problems: Encounter for surgical clearance Patient needs to have urgent surgery done for decompression due to herniated disc.  His risk factors are well controlled and he is overall low cardiac risk for complication from surgery.  We will complete his surgical clearance form faxed to surgeon's office today.  Chronic Problems Addressed Today: Hypertension associated with diabetes ([place]) Home blood pressure readings have been at goal.  We will refill his irbesartan.  Gastroesophageal reflux disease without esophagitis Stable on Nexium.  Refill today.  Diabetes mellitus type 1 ([place]) Managed by endocrinology.  Currently on insulin pump which seems to be working well.  Last A1c was 7.7.     Subjective:  HPI:  See A/p for status of chronic conditions.  Patient fell about 2 months ago while at the beach. Since then he has had persistent pain in his hips and legs.  He went to the ED 2 weeks ago after having a sudden onset of pain. In the ED he had xray which was negative and he was discharged home. He ended up going to orthopedics and got an MRI which showed a ruptured disc. HE is scheduled to have surgery ASAP.  He is starting to have more weakness in the lower extremities.  Still has a lot of pain.  Worse with certain motions.  This has significantly impacted his ADLs.       Objective/Observations  Physical Exam: Gen: NAD, resting comfortably Pulm: Normal work of breathing Neuro: Grossly normal, moves all extremities Psych: Normal affect and thought content  Virtual Visit via Video   I connected with [name] on 09/26/21 at 11:00 AM EDT by a video enabled telemedicine application and verified that I am speaking with the correct person using two identifiers. The limitations of evaluation and management by telemedicine and the availability of in person appointments were  discussed. The patient expressed understanding and agreed to proceed.   Patient location: Home Provider location: [place] participating in the virtual visit: Myself and Patient     [name]. [name], MD 09/26/2021 11:39 AM

## 2021-09-28 ENCOUNTER — Ambulatory Visit: Payer: Self-pay | Admitting: Orthopedic Surgery

## 2021-09-28 DIAGNOSIS — M5126 Other intervertebral disc displacement, lumbar region: Secondary | ICD-10-CM

## 2021-09-30 NOTE — Progress Notes (Addendum)
Surgical Instructions    Your procedure is scheduled on Friday September 22nd .  Report to [facility] Main Entrance "A" at 10:45 A.M., then check in with the Admitting office.  Call this number if you have problems the morning of surgery:  [phone]   If you have any questions prior to your surgery date call [phone]: Open Monday-Friday 8am-4pm    Remember:  Do not eat after midnight the night before your surgery  You may drink clear liquids until 9:45 the morning of your surgery.   Clear liquids allowed are: Water, Non-Citrus Juices (without pulp), Carbonated Beverages, Clear Tea, Black Coffee ONLY (NO MILK, CREAM OR POWDERED CREAMER of any kind), and Gatorade   Enhanced Recovery after Surgery for Orthopedics Enhanced Recovery after Surgery is a protocol used to improve the stress on your body and your recovery after surgery.  Patient Instructions   The day of surgery (if you have diabetes):  Drink ONE small 10 oz bottle of water by __9:45___ am the morning of surgery This bottle was given to you during your hospital  pre-op appointment visit.  Nothing else to drink after completing the  Small 10 oz bottle of water.         If you have questions, please contact your surgeon's office.   Take these medicines the morning of surgery with A SIP OF WATER: gabapentin (NEURONTIN) 300 MG capsule oxyCODONE-acetaminophen (PERCOCET/ROXICET) 5-325 MG tablet   As of today, STOP taking any Aspirin (unless otherwise instructed by your surgeon) Aleve, Naproxen, Ibuprofen, Motrin, Advil, Goody's, BC's, all herbal medications, fish oil, and all vitamins.  WHAT DO I DO ABOUT MY DIABETES MEDICATION?   Do not take oral diabetes medicines (pills) the morning of surgery.  THE DAY BEFORE SURGERY, take usual dose of Humalog insulin before meals but DO NOT take a bedtime dose of Humalog.     THE MORNING OF SURGERY if your CBG is greater than 220 mg/dL, you may take  of your sliding  scale (correction) dose of Humalog insulin.   HOW TO MANAGE YOUR DIABETES BEFORE AND AFTER SURGERY  Why is it important to control my blood sugar before and after surgery? Improving blood sugar levels before and after surgery helps healing and can limit problems. A way of improving blood sugar control is eating a healthy diet by:  Eating less sugar and carbohydrates  Increasing activity/exercise  Talking with your doctor about reaching your blood sugar goals High blood sugars (greater than 180 mg/dL) can raise your risk of infections and slow your recovery, so you will need to focus on controlling your diabetes during the weeks before surgery. Make sure that the doctor who takes care of your diabetes knows about your planned surgery including the date and location.  How do I manage my blood sugar before surgery? Check your blood sugar at least 4 times a day, starting 2 days before surgery, to make sure that the level is not too high or low.  Check your blood sugar the morning of your surgery when you wake up and every 2 hours until you get to the Short Stay unit.  If your blood sugar is less than 70 mg/dL, you will need to treat for low blood sugar: Do not take insulin. Treat a low blood sugar (less than 70 mg/dL) with  cup of clear juice (cranberry or apple), 4 glucose tablets, OR glucose gel. Recheck blood sugar in 15 minutes after treatment (to make sure it is greater than  70 mg/dL). If your blood sugar is not greater than 70 mg/dL on recheck, call [phone] for further instructions. Report your blood sugar to the short stay nurse when you get to Short Stay.  If you are admitted to the hospital after surgery: Your blood sugar will be checked by the staff and you will probably be given insulin after surgery (instead of oral diabetes medicines) to make sure you have good blood sugar levels. The goal for blood sugar control after surgery is 80-180 mg/dL.       DAY OF SURGERY       Do not wear jewelry  Do not wear lotions, powders, cologne or deodorant. Do not shave 48 hours prior to surgery.  Men may shave face and neck. Do not bring valuables to the hospital. Do not wear nail polish  [facility] is not responsible for any belongings or valuables.    Do NOT Smoke (Tobacco/Vaping)  24 hours prior to your procedure  If you use a CPAP at night, you may bring your mask for your overnight stay.   Contacts, glasses, hearing aids, dentures or partials may not be worn into surgery, please bring cases for these belongings   For patients admitted to the hospital, discharge time will be determined by your treatment team.   Patients discharged the day of surgery will not be allowed to drive home, and someone needs to stay with them for 24 hours.   SURGICAL WAITING ROOM VISITATION Patients having surgery or a procedure may have no more than 2 support people in the waiting area - these visitors may rotate.   Children under the age of 29 must have an adult with them who is not the patient. If the patient needs to stay at the hospital during part of their recovery, the visitor guidelines for inpatient rooms apply. Pre-op nurse will coordinate an appropriate time for 1 support person to accompany patient in pre-op.  This support person may not rotate.   Please refer to the [facility] website for the visitor guidelines for Inpatients (after your surgery is over and you are in a regular room).    Special instructions:    Oral Hygiene is also important to reduce your risk of infection.  Remember - BRUSH YOUR TEETH THE MORNING OF SURGERY WITH YOUR REGULAR TOOTHPASTE   [place]- Preparing For Surgery  Before surgery, you can play an important role. Because skin is not sterile, your skin needs to be as free of germs as possible. You can reduce the number of germs on your skin by washing with CHG (chlorahexidine gluconate) Soap before surgery.  CHG is an antiseptic cleaner  which kills germs and bonds with the skin to continue killing germs even after washing.     Please do not use if you have an allergy to CHG or antibacterial soaps. If your skin becomes reddened/irritated stop using the CHG.  Do not shave (including legs and underarms) for at least 48 hours prior to first CHG shower. It is OK to shave your face.  Please follow these instructions carefully.     Shower the NIGHT BEFORE SURGERY and the MORNING OF SURGERY with CHG Soap.   If you chose to wash your hair, wash your hair first as usual with your normal shampoo. After you shampoo, rinse your hair and body thoroughly to remove the shampoo.  Then [employer] and genitals (private parts) with your normal soap and rinse thoroughly to remove soap.  After that Use  CHG Soap as you would any other liquid soap. You can apply CHG directly to the skin and wash gently with a scrungie or a clean washcloth.   Apply the CHG Soap to your body ONLY FROM THE NECK DOWN.  Do not use on open wounds or open sores. Avoid contact with your eyes, ears, mouth and genitals (private parts). Wash Face and genitals (private parts)  with your normal soap.   Wash thoroughly, paying special attention to the area where your surgery will be performed.  Thoroughly rinse your body with warm water from the neck down.  DO NOT shower/wash with your normal soap after using and rinsing off the CHG Soap.  Pat yourself dry with a CLEAN TOWEL.  Wear CLEAN PAJAMAS to bed the night before surgery  Place CLEAN SHEETS on your bed the night before your surgery  DO NOT SLEEP WITH PETS.   Day of Surgery:  Take a shower with CHG soap. Wear Clean/Comfortable clothing the morning of surgery Do not apply any deodorants/lotions.   Remember to brush your teeth WITH YOUR REGULAR TOOTHPASTE.    If you received a COVID test during your pre-op visit, it is requested that you wear a mask when out in public, stay away from anyone that may not be  feeling well, and notify your surgeon if you develop symptoms. If you have been in contact with anyone that has tested positive in the last 10 days, please notify your surgeon.    Please read over the following fact sheets that you were given.

## 2021-10-03 ENCOUNTER — Encounter (HOSPITAL_COMMUNITY)
Admission: RE | Admit: 2021-10-03 | Discharge: 2021-10-03 | Disposition: A | Discharge status: Home / Self Care | Payer: Commercial Managed Care - PPO | Source: Ambulatory Visit | Attending: Specialist | Admitting: Specialist

## 2021-10-03 ENCOUNTER — Ambulatory Visit: Payer: Self-pay | Admitting: Orthopedic Surgery

## 2021-10-03 ENCOUNTER — Encounter (HOSPITAL_COMMUNITY): Payer: Self-pay

## 2021-10-03 ENCOUNTER — Ambulatory Visit (HOSPITAL_COMMUNITY)
Admission: RE | Admit: 2021-10-03 | Discharge: 2021-10-03 | Disposition: A | Discharge status: Home / Self Care | Payer: Commercial Managed Care - PPO | Source: Ambulatory Visit | Attending: Orthopedic Surgery | Admitting: Orthopedic Surgery

## 2021-10-03 ENCOUNTER — Other Ambulatory Visit: Payer: Self-pay

## 2021-10-03 VITALS — BP 169/94 | HR 73 | Temp 97.6°F | Resp 18

## 2021-10-03 DIAGNOSIS — E109 Type 1 diabetes mellitus without complications: Secondary | ICD-10-CM | POA: Diagnosis not present

## 2021-10-03 DIAGNOSIS — I272 Pulmonary hypertension, unspecified: Secondary | ICD-10-CM | POA: Insufficient documentation

## 2021-10-03 DIAGNOSIS — M5126 Other intervertebral disc displacement, lumbar region: Secondary | ICD-10-CM

## 2021-10-03 DIAGNOSIS — Z01812 Encounter for preprocedural laboratory examination: Secondary | ICD-10-CM | POA: Insufficient documentation

## 2021-10-03 DIAGNOSIS — Z01818 Encounter for other preprocedural examination: Secondary | ICD-10-CM

## 2021-10-03 LAB — CBC
HCT: 43.1 % (ref 39.0–52.0)
Hemoglobin: 15.4 g/dL (ref 13.0–17.0)
MCH: 33.6 pg (ref 26.0–34.0)
MCHC: 35.7 g/dL (ref 30.0–36.0)
MCV: 93.9 fL (ref 80.0–100.0)
Platelets: 296 10*3/uL (ref 150–400)
RBC: 4.59 MIL/uL (ref 4.22–5.81)
RDW: 12.1 % (ref 11.5–15.5)
WBC: 10.8 10*3/uL — ABNORMAL HIGH (ref 4.0–10.5)
nRBC: 0 % (ref 0.0–0.2)

## 2021-10-03 LAB — BASIC METABOLIC PANEL
Anion gap: 13 (ref 5–15)
BUN: 15 mg/dL (ref 6–20)
CO2: 26 mmol/L (ref 22–32)
Calcium: 9.5 mg/dL (ref 8.9–10.3)
Chloride: 90 mmol/L — ABNORMAL LOW (ref 98–111)
Creatinine, Ser: 1.13 mg/dL (ref 0.61–1.24)
GFR, Estimated: >60 mL/min (ref >60)
Glucose, Bld: 332 mg/dL — ABNORMAL HIGH (ref 70–99)
Potassium: 5.4 mmol/L — ABNORMAL HIGH (ref 3.5–5.1)
Sodium: 129 mmol/L — ABNORMAL LOW (ref 135–145)

## 2021-10-03 LAB — SURGICAL PCR SCREEN
MRSA, PCR: NEGATIVE
Staphylococcus aureus: POSITIVE — AB

## 2021-10-03 LAB — GLUCOSE, CAPILLARY: Glucose-Capillary: 323 mg/dL — ABNORMAL HIGH (ref 70–99)

## 2021-10-03 NOTE — Progress Notes (Addendum)
  PCP -  Dimas Chyle Cardiologist - denies Endocrinologist-Whitney Rearden  PPM/ICD - denies Chest x-ray - 06/02/21 (1view) EKG - 06/02/21 Stress Test - "years ago" per patient- normal ECHO - 10/10/17 Cardiac Cath - denies Lumbar Xray- 10/03/2021 per order  Sleep Study - denies  Fasting Blood Sugar - Type 1 Diabetic with insulin pump and glucose monitor.  Hgb A1c 7.7 on 09/12/21.  Patient has Dexcom6 Continuous glucose monitor as well as Omnipod Insulin Pump. Patient due to change Omnipod on Wednesday 10/05/21. Patient was instructed by Dr. Tonita Cong that he can wear his insulin pump during surgery, but he cannot have insulin pump on abdomen during surgery. Patient states he will contact his endocrinologist about another site to place insulin pump. Pt changes pump next on 10/05/21 and stated that if he has to take the pump off on Friday, he can replace it after. Patient states that his basal rate is currently set to 0.5units/hour and he gives himself boluses based on his blood sugar when he eats. Pt reports he gave himself a bolus of 2.4 units of insulin at 10:02 AM. Patient CBG 323 from finger stick at PAT at 10:07. Pt reports his blood sugar is 298 on his Dexcom6 upon arrival to PAT office. Patient reports he has not eaten since yesterday evening, but did have coffee with cream no sugar this morning. Pt reports his blood sugars have been over 200 the past few days, but prior to that his blood sugars have been "normal" in the 150s.  Pt states he will contact his endocrinologist regarding perioperative instructions for insulin pump. Pt states his endocrinologist prefers to make changes to his pump herself.  Pt states he will notify endocrinologist of elevated blood sugars for the past few days.  Diabetic coordinator notified of patient.   Blood Thinner Instructions: N/A no blood thinners  ERAS Protcol - ERAS per protocol with G2  COVID TEST- N/A  Anesthesia review: Type 1 Diabetic with insulin pump,  pt reports he got clearance from medical doctor as well as endocrinologist. Review EKG  Patient denies shortness of breath, fever, cough and chest pain at PAT appointment  All instructions explained to the patient, with a verbal understanding of the material. Patient agrees to go over the instructions while at home for a better understanding. . The opportunity to ask questions was provided.

## 2021-10-03 NOTE — H&P (Signed)
[name] is an 56 y.o. male.   Chief Complaint: back and leg pain HPI: Reason for Visit: (normal) visit for: (back); 1 week check/ 13 weeks original injury and 16 days out from most recent Location (Lower Extremity): leg pain on the right, , ; foot pain on the right, , ; right buttock Severity: pain level 6/10 Aggravating Factors: walking for ; lying down Alleviating Factors: PT/OT; TENS Associated Symptoms: numbness/tingling (right great toe) Medications: Gabapentin 335m 1 qd and 2 qhs. Patient takes Oxycodone and Robaxin only as needed  Past Medical History:  Diagnosis Date   Allergy    Anemia    Diabetes mellitus without complication (HCC)    GERD (gastroesophageal reflux disease)    HLD (hyperlipidemia)    Hypertension     Past Surgical History:  Procedure Laterality Date   ANKLE SURGERY Right    UPPER GASTROINTESTINAL ENDOSCOPY     VASECTOMY  1994    Family History  Problem Relation Age of Onset   COPD Mother    Diabetes Mother    Early death Father [id number]      MI   Heart attack Father    Lymphoma Sister [phone]  Hypertension Brother    Diabetes Maternal Grandmother    Stroke Neg Hx    Alcohol abuse Neg Hx    Drug abuse Neg Hx    Hyperlipidemia Neg Hx    Kidney disease Neg Hx    Colon cancer Neg Hx    Esophageal cancer Neg Hx    Stomach cancer Neg Hx    Rectal cancer Neg Hx    Social History:  reports that he has been smoking cigarettes. He has been smoking an average of 1 pack per day. He has never used smokeless tobacco. He reports current alcohol use of about 8.0 standard drinks of alcohol per week. He reports that he does not use drugs.  Allergies:  Allergies  Allergen Reactions   Crestor [Rosuvastatin Calcium] Other (See Comments)    Muscle aches   Iron Other (See Comments)    "Tore up my stomach"  "Liquid Iron"   Lipitor [Atorvastatin Calcium] Other (See Comments)    Muscles aches   Statins Other (See Comments)    Muscle aches    Current  meds: Dexcom G6 Sensor device Dexcom G6 Transmitter device esomeprazole magnesium 40 mg capsule,delayed release ibuprofen 600 mg tablet irbesartan 300 mg tablet methocarbamoL 500 mg tablet Neurontin 300 mg capsule Omnipod 5 G6 Intro Kit (Gen 5) subcutaneous cartridge with controller oxyCODONE-acetaminophen 5 mg-325 mg tablet pantoprazole 40 mg tablet,delayed release predniSONE 5 mg tablets in a dose pack  Review of Systems  Constitutional: Negative.   HENT: Negative.    Eyes: Negative.   Respiratory: Negative.    Cardiovascular: Negative.   Endocrine: Negative.   Genitourinary: Negative.   Musculoskeletal:  Positive for back pain and gait problem.  Skin: Negative.   Neurological:  Positive for weakness and numbness.  Hematological: Negative.   Psychiatric/Behavioral: Negative.      There were no vitals taken for this visit. Physical Exam Constitutional:      General: He is in acute distress.     Appearance: Normal appearance.  HENT:     Head: Normocephalic and atraumatic.     Right Ear: External ear normal.     Left Ear: External ear normal.     Nose: Nose normal.     Mouth/Throat:     Pharynx: Oropharynx is clear.  Eyes:     Conjunctiva/sclera: Conjunctivae normal.  Cardiovascular:     Rate and Rhythm: Normal rate and regular rhythm.     Pulses: Normal pulses.  Pulmonary:     Effort: Pulmonary effort is normal.  Abdominal:     General: Bowel sounds are normal.  Musculoskeletal:     Cervical back: Normal range of motion.     Comments: Gait and Station: Appearance: ambulating with no assistive devices and antalgic gait.  Constitutional: General Appearance: healthy-appearing and distress (mild).  Psychiatric: Mood and Affect: active and alert.  Cardiovascular System: Edema Right: none; Dorsalis and posterior tibial pulses 2+. Edema Left: none.  Abdomen: Inspection and Palpation: non-distended and no tenderness.  Skin: Inspection and palpation: no  rash.  Lumbar Spine: Inspection: normal alignment. Bony Palpation of the Lumbar Spine: tender at lumbosacral junction.. Bony Palpation of the Right Hip: no tenderness of the greater trochanter and tenderness of the SI joint; Pelvis stable. Bony Palpation of the Left Hip: no tenderness of the greater trochanter and tenderness of the SI joint. Soft Tissue Palpation on the Right: No flank pain with percussion. Active Range of Motion: limited flexion and extention.  Motor Strength: L1 Motor Strength on the Right: hip flexion iliopsoas 5/5. L1 Motor Strength on the Left: hip flexion iliopsoas 5/5. L2-L4 Motor Strength on the Right: knee extension quadriceps 5/5. L2-L4 Motor Strength on the Left: knee extension quadriceps 5/5. L5 Motor Strength on the Right: ankle dorsiflexion tibialis anterior 3/5 and great toe extension extensor hallucis longus 3/5. L5 Motor Strength on the Left: ankle dorsiflexion tibialis anterior 5/5 and great toe extension extensor hallucis longus 5/5. S1 Motor Strength on the Right: plantar flexion gastrocnemius 5/5. S1 Motor Strength on the Left: plantar flexion gastrocnemius 5/5.  Neurological System: Knee Reflex Right: normal (2). Knee Reflex Left: normal (2). Ankle Reflex Right: normal (2). Ankle Reflex Left: normal (2). Babinski Reflex Right: plantar reflex absent. Babinski Reflex Left: plantar reflex absent. Sensation on the Right: normal distal extremities and decreased sensation on the lateral leg and dorsum of the foot (L5). Sensation on the Left: normal distal extremities. Special Tests on the Right: no clonus of the ankle/knee and seated straight leg raising test positive. Special Tests on the Left: no clonus of the ankle/knee.  Skin:    General: Skin is warm and dry.  Neurological:     Mental Status: He is alert.    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  Assessment/Plan Impression:  Persistent progressive right lower  extremity radicular pain myotome weakness dermatomal dysesthesias despite rest activity modification physical therapy analgesics patient has a profound foot drop.  Plan:  We discussed options given the progression and the lack of improvement despite conservative treatment we discussed a lumbar decompression ,he would like to proceed with that I had an extensive discussion with the patient concerning the pathology relevant anatomy and treatment options. At this point exhausting conservative treatment and in the presence of a neurologic deficit we discussed microlumbar decompression. I discussed the risks and benefits including bleeding, infection, DVT, PE, anesthetic complications, worsening in their symptoms, improvement in their symptoms, C SF leakage, epidural fibrosis, need for future surgeries such as revision discectomy and lumbar fusion. I also indicated that this is an operation to basically decompress the nerve roots to allow recovery as opposed to fixing a herniated disc if it is encountered and that the incidence of recurrent chest disc herniation can approach 15%. Also that nerve   root recovery is variable and may not recover completely. Any ligament or bone that is contributing to compressing the nerves will be removed as well.  I discussed the operative course including overnight in the hospital. Immediate ambulation. Follow-up in 2 weeks for suture removal. 6 weeks until healing of the herniation and surgical incision followed by 6 weeks of reconditioning and strengthening of the core musculature. Also discussed the need to employ the concepts of disc pressure management and core motion following the surgery to minimize the risk of recurrent disc herniation. We will obtain preoperative clearance i if necessary and proceed accordingly.  We will need preoperative clearance. He is in insulin-dependent diabetic.  Patient was given a prescription for gabapentin to be taken as directed. It is to be  titrated for effect up to 3 times a day for neuropathic pain as needed. Starting with 1 a day for 3 days, then 2 a day for 3 days, then 3 times a day as needed. It is frequent that during the day the side effects are not tolerated and therefore taken only at night. Symptoms such as dizziness and being groggy. Occasionally there is fluid retention and weight gain associated with this medication. When the pain subsides and the medication is no longer needed it should be discontinued by slowly weaning off the medication. If taking it 3 times a day then it should be decreased to 2 times a day for a week and then 1 time a day for a week and then as needed.  Patient is to call or present to the emergency room if there is any numbness or weakness to suggest worsening of their condition. In addition although rare if there is loss of bowel or bladder function such as incontinence or inability to void that this may represent a cauda equina syndrome. If noted the patient is to present immediately to the emergency room for evaluation and treatment otherwise permanent bowel or bladder dysfunction can occur as a result.  Plan microlumbar decompression L4-5, possible L5-S1  Cecilie Kicks, PA-C for Dr Tonita Cong 10/03/2021, 7:48 AM

## 2021-10-03 NOTE — H&P (View-Only) (Signed)
[name] is an 56 y.o. male.   Chief Complaint: back and leg pain HPI: Reason for Visit: (normal) visit for: (back); 1 week check/ 13 weeks original injury and 16 days out from most recent Location (Lower Extremity): leg pain on the right, , ; foot pain on the right, , ; right buttock Severity: pain level 6/10 Aggravating Factors: walking for ; lying down Alleviating Factors: PT/OT; TENS Associated Symptoms: numbness/tingling (right great toe) Medications: Gabapentin 335m 1 qd and 2 qhs. Patient takes Oxycodone and Robaxin only as needed  Past Medical History:  Diagnosis Date   Allergy    Anemia    Diabetes mellitus without complication (HCC)    GERD (gastroesophageal reflux disease)    HLD (hyperlipidemia)    Hypertension     Past Surgical History:  Procedure Laterality Date   ANKLE SURGERY Right    UPPER GASTROINTESTINAL ENDOSCOPY     VASECTOMY  1994    Family History  Problem Relation Age of Onset   COPD Mother    Diabetes Mother    Early death Father [id number]      MI   Heart attack Father    Lymphoma Sister [phone]  Hypertension Brother    Diabetes Maternal Grandmother    Stroke Neg Hx    Alcohol abuse Neg Hx    Drug abuse Neg Hx    Hyperlipidemia Neg Hx    Kidney disease Neg Hx    Colon cancer Neg Hx    Esophageal cancer Neg Hx    Stomach cancer Neg Hx    Rectal cancer Neg Hx    Social History:  reports that he has been smoking cigarettes. He has been smoking an average of 1 pack per day. He has never used smokeless tobacco. He reports current alcohol use of about 8.0 standard drinks of alcohol per week. He reports that he does not use drugs.  Allergies:  Allergies  Allergen Reactions   Crestor [Rosuvastatin Calcium] Other (See Comments)    Muscle aches   Iron Other (See Comments)    "Tore up my stomach"  "Liquid Iron"   Lipitor [Atorvastatin Calcium] Other (See Comments)    Muscles aches   Statins Other (See Comments)    Muscle aches    Current  meds: Dexcom G6 Sensor device Dexcom G6 Transmitter device esomeprazole magnesium 40 mg capsule,delayed release ibuprofen 600 mg tablet irbesartan 300 mg tablet methocarbamoL 500 mg tablet Neurontin 300 mg capsule Omnipod 5 G6 Intro Kit (Gen 5) subcutaneous cartridge with controller oxyCODONE-acetaminophen 5 mg-325 mg tablet pantoprazole 40 mg tablet,delayed release predniSONE 5 mg tablets in a dose pack  Review of Systems  Constitutional: Negative.   HENT: Negative.    Eyes: Negative.   Respiratory: Negative.    Cardiovascular: Negative.   Endocrine: Negative.   Genitourinary: Negative.   Musculoskeletal:  Positive for back pain and gait problem.  Skin: Negative.   Neurological:  Positive for weakness and numbness.  Hematological: Negative.   Psychiatric/Behavioral: Negative.      There were no vitals taken for this visit. Physical Exam Constitutional:      General: He is in acute distress.     Appearance: Normal appearance.  HENT:     Head: Normocephalic and atraumatic.     Right Ear: External ear normal.     Left Ear: External ear normal.     Nose: Nose normal.     Mouth/Throat:     Pharynx: Oropharynx is clear.  Eyes:     Conjunctiva/sclera: Conjunctivae normal.  Cardiovascular:     Rate and Rhythm: Normal rate and regular rhythm.     Pulses: Normal pulses.  Pulmonary:     Effort: Pulmonary effort is normal.  Abdominal:     General: Bowel sounds are normal.  Musculoskeletal:     Cervical back: Normal range of motion.     Comments: Gait and Station: Appearance: ambulating with no assistive devices and antalgic gait.  Constitutional: General Appearance: healthy-appearing and distress (mild).  Psychiatric: Mood and Affect: active and alert.  Cardiovascular System: Edema Right: none; Dorsalis and posterior tibial pulses 2+. Edema Left: none.  Abdomen: Inspection and Palpation: non-distended and no tenderness.  Skin: Inspection and palpation: no  rash.  Lumbar Spine: Inspection: normal alignment. Bony Palpation of the Lumbar Spine: tender at lumbosacral junction.. Bony Palpation of the Right Hip: no tenderness of the greater trochanter and tenderness of the SI joint; Pelvis stable. Bony Palpation of the Left Hip: no tenderness of the greater trochanter and tenderness of the SI joint. Soft Tissue Palpation on the Right: No flank pain with percussion. Active Range of Motion: limited flexion and extention.  Motor Strength: L1 Motor Strength on the Right: hip flexion iliopsoas 5/5. L1 Motor Strength on the Left: hip flexion iliopsoas 5/5. L2-L4 Motor Strength on the Right: knee extension quadriceps 5/5. L2-L4 Motor Strength on the Left: knee extension quadriceps 5/5. L5 Motor Strength on the Right: ankle dorsiflexion tibialis anterior 3/5 and great toe extension extensor hallucis longus 3/5. L5 Motor Strength on the Left: ankle dorsiflexion tibialis anterior 5/5 and great toe extension extensor hallucis longus 5/5. S1 Motor Strength on the Right: plantar flexion gastrocnemius 5/5. S1 Motor Strength on the Left: plantar flexion gastrocnemius 5/5.  Neurological System: Knee Reflex Right: normal (2). Knee Reflex Left: normal (2). Ankle Reflex Right: normal (2). Ankle Reflex Left: normal (2). Babinski Reflex Right: plantar reflex absent. Babinski Reflex Left: plantar reflex absent. Sensation on the Right: normal distal extremities and decreased sensation on the lateral leg and dorsum of the foot (L5). Sensation on the Left: normal distal extremities. Special Tests on the Right: no clonus of the ankle/knee and seated straight leg raising test positive. Special Tests on the Left: no clonus of the ankle/knee.  Skin:    General: Skin is warm and dry.  Neurological:     Mental Status: He is alert.    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  Assessment/Plan Impression:  Persistent progressive right lower  extremity radicular pain myotome weakness dermatomal dysesthesias despite rest activity modification physical therapy analgesics patient has a profound foot drop.  Plan:  We discussed options given the progression and the lack of improvement despite conservative treatment we discussed a lumbar decompression ,he would like to proceed with that I had an extensive discussion with the patient concerning the pathology relevant anatomy and treatment options. At this point exhausting conservative treatment and in the presence of a neurologic deficit we discussed microlumbar decompression. I discussed the risks and benefits including bleeding, infection, DVT, PE, anesthetic complications, worsening in their symptoms, improvement in their symptoms, C SF leakage, epidural fibrosis, need for future surgeries such as revision discectomy and lumbar fusion. I also indicated that this is an operation to basically decompress the nerve roots to allow recovery as opposed to fixing a herniated disc if it is encountered and that the incidence of recurrent chest disc herniation can approach 15%. Also that nerve  root recovery is variable and may not recover completely. Any ligament or bone that is contributing to compressing the nerves will be removed as well.  I discussed the operative course including overnight in the hospital. Immediate ambulation. Follow-up in 2 weeks for suture removal. 6 weeks until healing of the herniation and surgical incision followed by 6 weeks of reconditioning and strengthening of the core musculature. Also discussed the need to employ the concepts of disc pressure management and core motion following the surgery to minimize the risk of recurrent disc herniation. We will obtain preoperative clearance i if necessary and proceed accordingly.  We will need preoperative clearance. He is in insulin-dependent diabetic.  Patient was given a prescription for gabapentin to be taken as directed. It is to be  titrated for effect up to 3 times a day for neuropathic pain as needed. Starting with 1 a day for 3 days, then 2 a day for 3 days, then 3 times a day as needed. It is frequent that during the day the side effects are not tolerated and therefore taken only at night. Symptoms such as dizziness and being groggy. Occasionally there is fluid retention and weight gain associated with this medication. When the pain subsides and the medication is no longer needed it should be discontinued by slowly weaning off the medication. If taking it 3 times a day then it should be decreased to 2 times a day for a week and then 1 time a day for a week and then as needed.  Patient is to call or present to the emergency room if there is any numbness or weakness to suggest worsening of their condition. In addition although rare if there is loss of bowel or bladder function such as incontinence or inability to void that this may represent a cauda equina syndrome. If noted the patient is to present immediately to the emergency room for evaluation and treatment otherwise permanent bowel or bladder dysfunction can occur as a result.  Plan microlumbar decompression L4-5, possible L5-S1  Cecilie Kicks, PA-C for Dr Tonita Cong 10/03/2021, 7:48 AM

## 2021-10-03 NOTE — Progress Notes (Signed)
Surgical Instructions    Your procedure is scheduled on Friday September 22nd .  Report to [facility] Main Entrance "A" at 10:45 A.M., then check in with the Admitting office.  Call this number if you have problems the morning of surgery:  [phone]   If you have any questions prior to your surgery date call [phone]: Open Monday-Friday 8am-4pm    Remember:  Do not eat after midnight the night before your surgery  You may drink clear liquids until 9:45 the morning of your surgery.   Clear liquids allowed are: Water, Non-Citrus Juices (without pulp), Carbonated Beverages, Clear Tea, Black Coffee ONLY (NO MILK, CREAM OR POWDERED CREAMER of any kind), and Gatorade  Patient Instructions  The night before surgery:  No food after midnight. ONLY clear liquids after midnight   The day of surgery (if you have diabetes): Drink ONE (1) 12 oz G2 given to you in your pre admission testing appointment by 9:45AM the morning of surgery. Drink in one sitting. Do not sip.  This drink was given to you during your hospital  pre-op appointment visit.  Nothing else to drink after completing the  12 oz bottle of G2.         If you have questions, please contact your surgeon's office.    Take these medicines the morning of surgery with A SIP OF WATER: gabapentin (NEURONTIN) 300 MG capsule oxyCODONE-acetaminophen (PERCOCET/ROXICET) 5-325 MG tablet if needed   As of today, STOP taking any Aspirin (unless otherwise instructed by your surgeon) Aleve, Naproxen, Ibuprofen, Motrin, Advil, Goody's, BC's, all herbal medications, fish oil, and all vitamins.  WHAT DO I DO ABOUT MY DIABETES MEDICATION?    Insulin Pump- Contact your primary care provider or endocrinologist for instructions on how to manage insulin pump in the peri-operative period OR Reduce all basal rates by 20% at midnight the night before your surgery.   -Let your endocrinologist know that your blood sugars have been elevated over  200 the last few days.  -Let your endocrinologist know what time your surgery is and that you will not be eating after midnight, but drinking clear liquids until 9:45AM.  -Ask your endocrinologist what site to change your Omnipod to prior to surgery. You will be lying on your stomach during surgery.     HOW TO MANAGE YOUR DIABETES BEFORE AND AFTER SURGERY  Why is it important to control my blood sugar before and after surgery? Improving blood sugar levels before and after surgery helps healing and can limit problems. A way of improving blood sugar control is eating a healthy diet by:  Eating less sugar and carbohydrates  Increasing activity/exercise  Talking with your doctor about reaching your blood sugar goals High blood sugars (greater than 180 mg/dL) can raise your risk of infections and slow your recovery, so you will need to focus on controlling your diabetes during the weeks before surgery. Make sure that the doctor who takes care of your diabetes knows about your planned surgery including the date and location.  How do I manage my blood sugar before surgery? Check your blood sugar at least 4 times a day, starting 2 days before surgery, to make sure that the level is not too high or low.  Check your blood sugar the morning of your surgery when you wake up and every 2 hours until you get to the Short Stay unit.  If your blood sugar is less than 70 mg/dL, you will need to treat for low  blood sugar: Do not take insulin. Treat a low blood sugar (less than 70 mg/dL) with  cup of clear juice (cranberry or apple), 4 glucose tablets, OR glucose gel. Recheck blood sugar in 15 minutes after treatment (to make sure it is greater than 70 mg/dL). If your blood sugar is not greater than 70 mg/dL on recheck, call [phone] for further instructions. Report your blood sugar to the short stay nurse when you get to Short Stay.  If you are admitted to the hospital after surgery: Your blood sugar  will be checked by the staff and you will probably be given insulin after surgery (instead of oral diabetes medicines) to make sure you have good blood sugar levels. The goal for blood sugar control after surgery is 80-180 mg/dL.    [facility] is not responsible for any belongings or valuables.    Do NOT Smoke (Tobacco/Vaping)  24 hours prior to your procedure  If you use a CPAP at night, you may bring your mask for your overnight stay.   Contacts, glasses, hearing aids, dentures or partials may not be worn into surgery, please bring cases for these belongings   For patients admitted to the hospital, discharge time will be determined by your treatment team.   Patients discharged the day of surgery will not be allowed to drive home, and someone needs to stay with them for 24 hours.   SURGICAL WAITING ROOM VISITATION Patients having surgery or a procedure may have no more than 2 support people in the waiting area - these visitors may rotate.   Children under the age of 4 must have an adult with them who is not the patient. If the patient needs to stay at the hospital during part of their recovery, the visitor guidelines for inpatient rooms apply. Pre-op nurse will coordinate an appropriate time for 1 support person to accompany patient in pre-op.  This support person may not rotate.   Please refer to the [facility] website for the visitor guidelines for Inpatients (after your surgery is over and you are in a regular room).    Special instructions:    Oral Hygiene is also important to reduce your risk of infection.  Remember - BRUSH YOUR TEETH THE MORNING OF SURGERY WITH YOUR REGULAR TOOTHPASTE   [facility]- Preparing For Surgery  Before surgery, you can play an important role. Because skin is not sterile, your skin needs to be as free of germs as possible. You can reduce the number of germs on your skin by washing with CHG (chlorahexidine gluconate) Soap before surgery.  CHG is an  antiseptic cleaner which kills germs and bonds with the skin to continue killing germs even after washing.     Please do not use if you have an allergy to CHG or antibacterial soaps. If your skin becomes reddened/irritated stop using the CHG.  Do not shave (including legs and underarms) for at least 48 hours prior to first CHG shower. It is OK to shave your face.  Please follow these instructions carefully.     Shower the NIGHT BEFORE SURGERY and the MORNING OF SURGERY with CHG Soap.   If you chose to wash your hair, wash your hair first as usual with your normal shampoo. After you shampoo, rinse your hair and body thoroughly to remove the shampoo.  Then [employer] and genitals (private parts) with your normal soap and rinse thoroughly to remove soap.  After that Use CHG Soap as you would any other  liquid soap. You can apply CHG directly to the skin and wash gently with a scrungie or a clean washcloth.   Apply the CHG Soap to your body ONLY FROM THE NECK DOWN.  Do not use on open wounds or open sores. Avoid contact with your eyes, ears, mouth and genitals (private parts). Wash Face and genitals (private parts)  with your normal soap.   Wash thoroughly, paying special attention to the area where your surgery will be performed.  Thoroughly rinse your body with warm water from the neck down.  DO NOT shower/wash with your normal soap after using and rinsing off the CHG Soap.  Pat yourself dry with a CLEAN TOWEL.  Wear CLEAN PAJAMAS to bed the night before surgery  Place CLEAN SHEETS on your bed the night before your surgery  DO NOT SLEEP WITH PETS.   Day of Surgery:  Take a shower with CHG soap. Wear Clean/Comfortable clothing the morning of surgery      DAY OF SURGERY      Do not wear jewelry  Do not wear lotions, powders, cologne or deodorant. Do not shave 48 hours prior to surgery.  Men may shave face and neck. Do not bring valuables to the hospital. Do not wear nail  polish Remember to brush your teeth WITH YOUR REGULAR TOOTHPASTE.    If you received a COVID test during your pre-op visit, it is requested that you wear a mask when out in public, stay away from anyone that may not be feeling well, and notify your surgeon if you develop symptoms. If you have been in contact with anyone that has tested positive in the last 10 days, please notify your surgeon.    Please read over the following fact sheets that you were given.

## 2021-10-04 NOTE — Progress Notes (Signed)
Anesthesia Chart Review:  Patient is a type I diabetic.  A1c 7.7 on 09/12/2021.  He uses an Omni pod insulin pump.  Reports sugars have been running higher than normal over the past several days.  CBG 323 at PAT visit.  Patient reported he had not eaten since the prior day, but did have coffee with cream and sugar this morning. He was advised to reach out to his endocrinology provider Rayetta Pigg, NP to review recent hyperglycemia and recommendations for management.  He understands that markedly elevated blood sugar on day of surgery could be cause for cancellation.  I also reached out to Dr. Reather Littler surgical scheduler to advise of recent hyperglycemia.  Patient was seen by his PCP Dr. Dimas Chyle on 09/26/2021 for preop evaluation.  Per note, "Encounter for surgical clearance: Patient needs to have urgent surgery done for decompression due to herniated disc.  His risk factors are well controlled and he is overall low cardiac risk for complication from surgery.  We will complete his surgical clearance form faxed to surgeon's office today."  Preop labs reviewed, sodium low at 129 (135 when corrected for hyperglycemia), potassium mildly elevated at 5.4, otherwise unremarkable.  EKG 06/02/2021: Sinus rhythm.  Rate 89.  LAFB.  TTE 10/10/2017: - Left ventricle: The cavity size was normal. Wall thickness was    increased in a pattern of mild LVH. Systolic function was normal.    The estimated ejection fraction was in the range of 60% to 65%.    Wall motion was normal; there were no regional wall motion    abnormalities. Left ventricular diastolic function parameters    were normal.  - Mitral valve: Mildly thickened leaflets . There was trivial    regurgitation.  - Left atrium: The atrium was normal in size.  - Tricuspid valve: There was mild regurgitation.  - Pulmonary arteries: PA peak pressure: 55 mm Hg (S).  - Inferior vena cava: The vessel was dilated. The respirophasic    diameter changes were  blunted (< 50%), consistent with elevated    central venous pressure.   Impressions:   - LVEF 60-65%, mild LVH, normal wall motion, normal diastolic    function, normal biatrial size, mild TR, moderate pulmonary    hypertension (RVSP 55 mmHg), dilated IVC.     Wynonia Musty [facility] Short Stay Center/Anesthesiology Phone [phone] 10/04/2021 3:40 PM

## 2021-10-04 NOTE — Anesthesia Preprocedure Evaluation (Addendum)
Anesthesia Evaluation  Patient identified by MRN, date of birth, ID band Patient awake    Reviewed: Allergy & Precautions, NPO status , Patient's Chart, lab work & pertinent test results  Airway Mallampati: II  TM Distance: >3 FB Neck ROM: Full    Dental no notable dental hx.    Pulmonary Current Smoker,    Pulmonary exam normal breath sounds clear to auscultation       Cardiovascular hypertension, Normal cardiovascular exam Rhythm:Regular Rate:Normal   LVEF 60-65%, mild LVH, normal wall motion, normal diastolic  function, normal biatrial size, mild TR, moderate pulmonary  hypertension (RVSP 55 mmHg), dilated IVC.    Neuro/Psych negative neurological ROS  negative psych ROS   GI/Hepatic Neg liver ROS, GERD  Medicated,  Endo/Other  diabetes, Poorly Controlled, Type 1, Insulin Dependent  Renal/GU negative Renal ROS  negative genitourinary   Musculoskeletal negative musculoskeletal ROS (+)   Abdominal   Peds negative pediatric ROS (+)  Hematology negative hematology ROS (+)   Anesthesia Other Findings   Reproductive/Obstetrics negative OB ROS                            Anesthesia Physical Anesthesia Plan  ASA: 3  Anesthesia Plan: General   Post-op Pain Management: Ofirmev IV (intra-op)*   Induction: Intravenous  PONV Risk Score and Plan: 1 and Ondansetron and Treatment may vary due to age or medical condition  Airway Management Planned: Oral ETT  Additional Equipment:   Intra-op Plan:   Post-operative Plan: Extubation in OR  Informed Consent: I have reviewed the patients History and Physical, chart, labs and discussed the procedure including the risks, benefits and alternatives for the proposed anesthesia with the patient or authorized representative who has indicated his/her understanding and acceptance.     Dental advisory given  Plan Discussed with: CRNA and  Surgeon  Anesthesia Plan Comments: (PAT note by Karoline Caldwell, PA-C: Patient is a type I diabetic.  A1c 7.7 on 09/12/2021.  He uses an Omni pod insulin pump.  Reports sugars have been running higher than normal over the past several days.  CBG 323 at PAT visit.  Patient reported he had not eaten since the prior day, but did have coffee with cream and sugar this morning. He was advised to reach out to his endocrinology provider Rayetta Pigg, NP to review recent hyperglycemia and recommendations for management.  He understands that markedly elevated blood sugar on day of surgery could be cause for cancellation.  I also reached out to Dr. Reather Littler surgical scheduler to advise of recent hyperglycemia.  Patient was seen by his PCP Dr. Dimas Chyle on 09/26/2021 for preop evaluation.  Per note, "Encounter for surgical clearance: Patient needs to have urgent surgery done for decompression due to herniated disc. His risk factors are well controlled and he is overall low cardiacrisk for complicationfrom surgery.We will complete his surgical clearance form faxed to surgeon's office today."  Preop labs reviewed, sodium low at 129 (135 when corrected for hyperglycemia), potassium mildly elevated at 5.4, otherwise unremarkable.  EKG 06/02/2021: Sinus rhythm.  Rate 89.  LAFB.  TTE 10/10/2017: - Left ventricle: The cavity size was normal. Wall thickness was  increased in a pattern of mild LVH. Systolic function was normal.  The estimated ejection fraction was in the range of 60% to 65%.  Wall motion was normal; there were no regional wall motion  abnormalities. Left ventricular diastolic function parameters  were normal.  -  Mitral valve: Mildly thickened leaflets . There was trivial  regurgitation.  - Left atrium: The atrium was normal in size.  - Tricuspid valve: There was mild regurgitation.  - Pulmonary arteries: PA peak pressure: 55 mm Hg (S).  - Inferior vena cava: The vessel was dilated.  The respirophasic  diameter changes were blunted (< 50%), consistent with elevated  central venous pressure.   Impressions:   - LVEF 60-65%, mild LVH, normal wall motion, normal diastolic  function, normal biatrial size, mild TR, moderate pulmonary  hypertension (RVSP 55 mmHg), dilated IVC. )       Anesthesia Quick Evaluation

## 2021-10-07 ENCOUNTER — Ambulatory Visit (HOSPITAL_COMMUNITY)
Admission: RE | Admit: 2021-10-07 | Discharge: 2021-10-08 | Disposition: A | Discharge status: Home / Self Care | Payer: Commercial Managed Care - PPO | Attending: Specialist | Admitting: Specialist

## 2021-10-07 ENCOUNTER — Ambulatory Visit (HOSPITAL_BASED_OUTPATIENT_CLINIC_OR_DEPARTMENT_OTHER): Payer: Commercial Managed Care - PPO | Admitting: Anesthesiology

## 2021-10-07 ENCOUNTER — Ambulatory Visit (HOSPITAL_COMMUNITY): Payer: Commercial Managed Care - PPO

## 2021-10-07 ENCOUNTER — Other Ambulatory Visit: Payer: Self-pay

## 2021-10-07 ENCOUNTER — Encounter (HOSPITAL_COMMUNITY): Payer: Self-pay | Admitting: Specialist

## 2021-10-07 ENCOUNTER — Ambulatory Visit (HOSPITAL_COMMUNITY): Payer: Commercial Managed Care - PPO | Admitting: Physician Assistant

## 2021-10-07 ENCOUNTER — Ambulatory Visit (HOSPITAL_COMMUNITY)
Admission: RE | Disposition: A | Discharge status: Home / Self Care | Payer: Self-pay | Source: Home / Self Care | Attending: Specialist

## 2021-10-07 DIAGNOSIS — K219 Gastro-esophageal reflux disease without esophagitis: Secondary | ICD-10-CM | POA: Diagnosis not present

## 2021-10-07 DIAGNOSIS — M48061 Spinal stenosis, lumbar region without neurogenic claudication: Secondary | ICD-10-CM

## 2021-10-07 DIAGNOSIS — M5126 Other intervertebral disc displacement, lumbar region: Secondary | ICD-10-CM | POA: Diagnosis present

## 2021-10-07 DIAGNOSIS — E1065 Type 1 diabetes mellitus with hyperglycemia: Secondary | ICD-10-CM

## 2021-10-07 DIAGNOSIS — F17210 Nicotine dependence, cigarettes, uncomplicated: Secondary | ICD-10-CM

## 2021-10-07 DIAGNOSIS — E109 Type 1 diabetes mellitus without complications: Secondary | ICD-10-CM | POA: Diagnosis not present

## 2021-10-07 DIAGNOSIS — I10 Essential (primary) hypertension: Secondary | ICD-10-CM | POA: Insufficient documentation

## 2021-10-07 DIAGNOSIS — Z794 Long term (current) use of insulin: Secondary | ICD-10-CM | POA: Insufficient documentation

## 2021-10-07 DIAGNOSIS — M5116 Intervertebral disc disorders with radiculopathy, lumbar region: Secondary | ICD-10-CM | POA: Insufficient documentation

## 2021-10-07 HISTORY — PX: LUMBAR LAMINECTOMY/DECOMPRESSION MICRODISCECTOMY: SHX5026

## 2021-10-07 LAB — GLUCOSE, CAPILLARY
Glucose-Capillary: 237 mg/dL — ABNORMAL HIGH (ref 70–99)
Glucose-Capillary: 165 mg/dL — ABNORMAL HIGH (ref 70–99)
Glucose-Capillary: 120 mg/dL — ABNORMAL HIGH (ref 70–99)
Glucose-Capillary: 130 mg/dL — ABNORMAL HIGH (ref 70–99)
Glucose-Capillary: 282 mg/dL — ABNORMAL HIGH (ref 70–99)
Glucose-Capillary: 369 mg/dL — ABNORMAL HIGH (ref 70–99)

## 2021-10-07 SURGERY — LUMBAR LAMINECTOMY/DECOMPRESSION MICRODISCECTOMY 2 LEVELS
Anesthesia: General

## 2021-10-07 MED ORDER — ACETAMINOPHEN 650 MG RE SUPP: 650.0000 mg | RECTAL | Status: DC | PRN

## 2021-10-07 MED ORDER — ACETAMINOPHEN 10 MG/ML IV SOLN
INTRAVENOUS | Status: AC
  Filled 2021-10-07: qty 100

## 2021-10-07 MED ORDER — CEFAZOLIN SODIUM-DEXTROSE 2-4 GM/100ML-% IV SOLN
2.0000 g | Freq: Three times a day (TID) | INTRAVENOUS | Status: DC
  Administered 2021-10-07 – 2021-10-08 (×2): 2 g via INTRAVENOUS
  Filled 2021-10-07 (×2): qty 100

## 2021-10-07 MED ORDER — ROCURONIUM BROMIDE 10 MG/ML (PF) SYRINGE
PREFILLED_SYRINGE | INTRAVENOUS | Status: DC | PRN
  Administered 2021-10-07: 60 mg via INTRAVENOUS
  Administered 2021-10-07: 10 mg via INTRAVENOUS
  Administered 2021-10-07: 20 mg via INTRAVENOUS

## 2021-10-07 MED ORDER — OXYCODONE HCL 5 MG/5ML PO SOLN: 5.0000 mg | Freq: Once | ORAL | Status: DC | PRN

## 2021-10-07 MED ORDER — SODIUM CHLORIDE 0.45 % IV SOLN: INTRAVENOUS | Status: DC

## 2021-10-07 MED ORDER — AMISULPRIDE (ANTIEMETIC) 5 MG/2ML IV SOLN: 10.0000 mg | Freq: Once | INTRAVENOUS | Status: DC | PRN

## 2021-10-07 MED ORDER — SUGAMMADEX SODIUM 200 MG/2ML IV SOLN
INTRAVENOUS | Status: DC | PRN
  Administered 2021-10-07: 162.4 mg via INTRAVENOUS

## 2021-10-07 MED ORDER — INSULIN ASPART 100 UNIT/ML IJ SOLN
10.0000 [IU] | Freq: Once | INTRAMUSCULAR | Status: AC
  Administered 2021-10-07: 10 [IU] via SUBCUTANEOUS

## 2021-10-07 MED ORDER — POLYETHYLENE GLYCOL 3350 17 G PO PACK: 17.0000 g | PACK | Freq: Every day | ORAL | 0 refills | Status: DC

## 2021-10-07 MED ORDER — FENTANYL CITRATE (PF) 250 MCG/5ML IJ SOLN
INTRAMUSCULAR | Status: DC | PRN
  Administered 2021-10-07 (×2): 50 ug via INTRAVENOUS
  Administered 2021-10-07: 100 ug via INTRAVENOUS
  Administered 2021-10-07: 50 ug via INTRAVENOUS

## 2021-10-07 MED ORDER — TRANEXAMIC ACID-NACL 1000-0.7 MG/100ML-% IV SOLN
1000.0000 mg | INTRAVENOUS | Status: AC
  Administered 2021-10-07: 1000 mg via INTRAVENOUS

## 2021-10-07 MED ORDER — PANTOPRAZOLE SODIUM 40 MG PO TBEC
40.0000 mg | DELAYED_RELEASE_TABLET | Freq: Every day | ORAL | Status: DC
  Administered 2021-10-07: 40 mg via ORAL
  Filled 2021-10-07: qty 1

## 2021-10-07 MED ORDER — LACTATED RINGERS IV SOLN: INTRAVENOUS | Status: DC

## 2021-10-07 MED ORDER — BISACODYL 5 MG PO TBEC: 5.0000 mg | DELAYED_RELEASE_TABLET | Freq: Every day | ORAL | Status: DC | PRN

## 2021-10-07 MED ORDER — METHOCARBAMOL 500 MG PO TABS
500.0000 mg | ORAL_TABLET | Freq: Four times a day (QID) | ORAL | Status: DC | PRN
  Administered 2021-10-07 – 2021-10-08 (×3): 500 mg via ORAL
  Filled 2021-10-07 (×3): qty 1

## 2021-10-07 MED ORDER — DOCUSATE SODIUM 100 MG PO CAPS: 100.0000 mg | ORAL_CAPSULE | Freq: Two times a day (BID) | ORAL | 1 refills | Status: DC | PRN

## 2021-10-07 MED ORDER — INSULIN ASPART 100 UNIT/ML IJ SOLN
0.0000 [IU] | Freq: Three times a day (TID) | INTRAMUSCULAR | Status: DC
  Administered 2021-10-07: 8 [IU] via SUBCUTANEOUS

## 2021-10-07 MED ORDER — ONDANSETRON HCL 4 MG/2ML IJ SOLN
4.0000 mg | Freq: Four times a day (QID) | INTRAMUSCULAR | Status: DC | PRN
  Administered 2021-10-07: 4 mg via INTRAVENOUS

## 2021-10-07 MED ORDER — TRANEXAMIC ACID 1000 MG/10ML IV SOLN
INTRAVENOUS | Status: DC | PRN
  Administered 2021-10-07: 2000 mg via TOPICAL

## 2021-10-07 MED ORDER — OXYCODONE HCL 5 MG PO TABS: 5.0000 mg | ORAL_TABLET | ORAL | 0 refills | Status: DC | PRN

## 2021-10-07 MED ORDER — THROMBIN 20000 UNITS EX SOLR
CUTANEOUS | Status: DC | PRN
  Administered 2021-10-07: 20 mL

## 2021-10-07 MED ORDER — ONDANSETRON HCL 4 MG/2ML IJ SOLN: 4.0000 mg | Freq: Once | INTRAMUSCULAR | Status: DC | PRN

## 2021-10-07 MED ORDER — ONDANSETRON HCL 4 MG PO TABS: 4.0000 mg | ORAL_TABLET | Freq: Four times a day (QID) | ORAL | Status: DC | PRN

## 2021-10-07 MED ORDER — BUPIVACAINE-EPINEPHRINE 0.5% -1:200000 IJ SOLN
INTRAMUSCULAR | Status: DC | PRN
  Administered 2021-10-07: 6 mL

## 2021-10-07 MED ORDER — RISAQUAD PO CAPS
1.0000 | ORAL_CAPSULE | Freq: Every day | ORAL | Status: DC
  Administered 2021-10-07 – 2021-10-08 (×2): 1 via ORAL
  Filled 2021-10-07 (×2): qty 1

## 2021-10-07 MED ORDER — PHENYLEPHRINE 80 MCG/ML (10ML) SYRINGE FOR IV PUSH (FOR BLOOD PRESSURE SUPPORT)
PREFILLED_SYRINGE | INTRAVENOUS | Status: DC | PRN
  Administered 2021-10-07: 80 ug via INTRAVENOUS

## 2021-10-07 MED ORDER — DEXTROSE-NACL 5-0.45 % IV SOLN: INTRAVENOUS | Status: DC

## 2021-10-07 MED ORDER — FENTANYL CITRATE (PF) 250 MCG/5ML IJ SOLN
INTRAMUSCULAR | Status: AC
  Filled 2021-10-07: qty 5

## 2021-10-07 MED ORDER — INSULIN ASPART 100 UNIT/ML IJ SOLN: 0.0000 [IU] | Freq: Every day | INTRAMUSCULAR | Status: DC

## 2021-10-07 MED ORDER — IRBESARTAN 300 MG PO TABS
300.0000 mg | ORAL_TABLET | Freq: Every day | ORAL | Status: DC
  Administered 2021-10-08: 300 mg via ORAL
  Filled 2021-10-07 (×2): qty 1

## 2021-10-07 MED ORDER — FENTANYL CITRATE (PF) 100 MCG/2ML IJ SOLN
INTRAMUSCULAR | Status: AC
  Filled 2021-10-07: qty 2

## 2021-10-07 MED ORDER — PANTOPRAZOLE SODIUM 40 MG PO TBEC: 40.0000 mg | DELAYED_RELEASE_TABLET | Freq: Every day | ORAL | Status: DC

## 2021-10-07 MED ORDER — PHENOL 1.4 % MT LIQD: 1.0000 | OROMUCOSAL | Status: DC | PRN

## 2021-10-07 MED ORDER — MENTHOL 3 MG MT LOZG: 1.0000 | LOZENGE | OROMUCOSAL | Status: DC | PRN

## 2021-10-07 MED ORDER — TRANEXAMIC ACID 1000 MG/10ML IV SOLN
2000.0000 mg | Freq: Once | INTRAVENOUS | Status: DC
  Filled 2021-10-07 (×2): qty 20

## 2021-10-07 MED ORDER — MAGNESIUM CITRATE PO SOLN: 1.0000 | Freq: Once | ORAL | Status: DC | PRN

## 2021-10-07 MED ORDER — MIDAZOLAM HCL 2 MG/2ML IJ SOLN
INTRAMUSCULAR | Status: AC
  Filled 2021-10-07: qty 2

## 2021-10-07 MED ORDER — SODIUM BICARBONATE 8.4 % IV SOLN
INTRAVENOUS | Status: AC
  Filled 2021-10-07: qty 50

## 2021-10-07 MED ORDER — POLYETHYLENE GLYCOL 3350 17 G PO PACK: 17.0000 g | PACK | Freq: Every day | ORAL | Status: DC | PRN

## 2021-10-07 MED ORDER — ALBUTEROL SULFATE HFA 108 (90 BASE) MCG/ACT IN AERS
INHALATION_SPRAY | RESPIRATORY_TRACT | Status: DC | PRN
  Administered 2021-10-07: 2 via RESPIRATORY_TRACT

## 2021-10-07 MED ORDER — ACETAMINOPHEN 325 MG PO TABS
650.0000 mg | ORAL_TABLET | ORAL | Status: DC | PRN
  Administered 2021-10-08 (×2): 650 mg via ORAL
  Filled 2021-10-07 (×2): qty 2

## 2021-10-07 MED ORDER — MIDAZOLAM HCL 2 MG/2ML IJ SOLN
INTRAMUSCULAR | Status: DC | PRN
  Administered 2021-10-07: 2 mg via INTRAVENOUS

## 2021-10-07 MED ORDER — ORAL CARE MOUTH RINSE: 15.0000 mL | Freq: Once | OROMUCOSAL | Status: AC

## 2021-10-07 MED ORDER — LIDOCAINE 2% (20 MG/ML) 5 ML SYRINGE
INTRAMUSCULAR | Status: DC | PRN
  Administered 2021-10-07: 100 mg via INTRAVENOUS

## 2021-10-07 MED ORDER — OXYCODONE HCL 5 MG PO TABS
10.0000 mg | ORAL_TABLET | ORAL | Status: DC | PRN
  Administered 2021-10-07 – 2021-10-08 (×5): 10 mg via ORAL
  Filled 2021-10-07 (×5): qty 2

## 2021-10-07 MED ORDER — PRONTOSAN WOUND IRRIGATION OPTIME: TOPICAL | Status: DC | PRN

## 2021-10-07 MED ORDER — HYDROMORPHONE HCL 1 MG/ML IJ SOLN
0.5000 mg | INTRAMUSCULAR | Status: DC | PRN
  Administered 2021-10-07: 0.5 mg via INTRAVENOUS
  Filled 2021-10-07: qty 0.5

## 2021-10-07 MED ORDER — OXYCODONE HCL 5 MG PO TABS: 5.0000 mg | ORAL_TABLET | ORAL | Status: DC | PRN

## 2021-10-07 MED ORDER — DOCUSATE SODIUM 100 MG PO CAPS
100.0000 mg | ORAL_CAPSULE | Freq: Two times a day (BID) | ORAL | Status: DC
  Administered 2021-10-07 – 2021-10-08 (×2): 100 mg via ORAL
  Filled 2021-10-07 (×2): qty 1

## 2021-10-07 MED ORDER — ACETAMINOPHEN 500 MG PO TABS
ORAL_TABLET | ORAL | Status: AC
  Administered 2021-10-07: 1000 mg via ORAL
  Filled 2021-10-07: qty 2

## 2021-10-07 MED ORDER — ALUM & MAG HYDROXIDE-SIMETH 200-200-20 MG/5ML PO SUSP: 30.0000 mL | Freq: Four times a day (QID) | ORAL | Status: DC | PRN

## 2021-10-07 MED ORDER — CEFAZOLIN SODIUM-DEXTROSE 2-4 GM/100ML-% IV SOLN
INTRAVENOUS | Status: AC
  Filled 2021-10-07: qty 100

## 2021-10-07 MED ORDER — FENTANYL CITRATE (PF) 100 MCG/2ML IJ SOLN
25.0000 ug | INTRAMUSCULAR | Status: DC | PRN
  Administered 2021-10-07 (×2): 25 ug via INTRAVENOUS
  Administered 2021-10-07: 50 ug via INTRAVENOUS

## 2021-10-07 MED ORDER — INSULIN ASPART 100 UNIT/ML IJ SOLN
0.0000 [IU] | Freq: Three times a day (TID) | INTRAMUSCULAR | Status: DC
  Administered 2021-10-08: 8 [IU] via SUBCUTANEOUS
  Administered 2021-10-08: 15 [IU] via SUBCUTANEOUS

## 2021-10-07 MED ORDER — CALCIUM CHLORIDE 10 % IV SOLN
INTRAVENOUS | Status: AC
  Filled 2021-10-07: qty 10

## 2021-10-07 MED ORDER — CEFAZOLIN SODIUM-DEXTROSE 2-4 GM/100ML-% IV SOLN
2.0000 g | INTRAVENOUS | Status: AC
  Administered 2021-10-07: 2 g via INTRAVENOUS

## 2021-10-07 MED ORDER — DEXAMETHASONE SODIUM PHOSPHATE 10 MG/ML IJ SOLN
INTRAMUSCULAR | Status: DC | PRN
  Administered 2021-10-07: 10 mg via INTRAVENOUS

## 2021-10-07 MED ORDER — THROMBIN (RECOMBINANT) 20000 UNITS EX SOLR
CUTANEOUS | Status: AC
  Filled 2021-10-07: qty 20000

## 2021-10-07 MED ORDER — PHENYLEPHRINE HCL-NACL 20-0.9 MG/250ML-% IV SOLN
INTRAVENOUS | Status: DC | PRN
  Administered 2021-10-07: 25 ug/min via INTRAVENOUS

## 2021-10-07 MED ORDER — CHLORHEXIDINE GLUCONATE 0.12 % MT SOLN
OROMUCOSAL | Status: AC
  Administered 2021-10-07: 15 mL via OROMUCOSAL
  Filled 2021-10-07: qty 15

## 2021-10-07 MED ORDER — ALBUTEROL SULFATE HFA 108 (90 BASE) MCG/ACT IN AERS
INHALATION_SPRAY | RESPIRATORY_TRACT | Status: AC
  Filled 2021-10-07: qty 6.7

## 2021-10-07 MED ORDER — GABAPENTIN 300 MG PO CAPS
300.0000 mg | ORAL_CAPSULE | Freq: Three times a day (TID) | ORAL | Status: DC
  Administered 2021-10-07 – 2021-10-08 (×3): 300 mg via ORAL
  Filled 2021-10-07 (×3): qty 1

## 2021-10-07 MED ORDER — OXYCODONE HCL 5 MG PO TABS: 5.0000 mg | ORAL_TABLET | Freq: Once | ORAL | Status: DC | PRN

## 2021-10-07 MED ORDER — BUPIVACAINE-EPINEPHRINE (PF) 0.5% -1:200000 IJ SOLN
INTRAMUSCULAR | Status: AC
  Filled 2021-10-07: qty 30

## 2021-10-07 MED ORDER — ACETAMINOPHEN 500 MG PO TABS: 1000.0000 mg | ORAL_TABLET | Freq: Once | ORAL | Status: AC

## 2021-10-07 MED ORDER — METHOCARBAMOL 1000 MG/10ML IJ SOLN: 500.0000 mg | Freq: Four times a day (QID) | INTRAVENOUS | Status: DC | PRN

## 2021-10-07 MED ORDER — TRANEXAMIC ACID-NACL 1000-0.7 MG/100ML-% IV SOLN
INTRAVENOUS | Status: AC
  Filled 2021-10-07: qty 100

## 2021-10-07 MED ORDER — CHLORHEXIDINE GLUCONATE 0.12 % MT SOLN: 15.0000 mL | Freq: Once | OROMUCOSAL | Status: AC

## 2021-10-07 MED ORDER — ACETAMINOPHEN 10 MG/ML IV SOLN
INTRAVENOUS | Status: DC | PRN
  Administered 2021-10-07: 1000 mg via INTRAVENOUS

## 2021-10-07 MED ORDER — PROPOFOL 10 MG/ML IV BOLUS
INTRAVENOUS | Status: DC | PRN
  Administered 2021-10-07: 10 mg via INTRAVENOUS

## 2021-10-07 MED ORDER — 0.9 % SODIUM CHLORIDE (POUR BTL) OPTIME
TOPICAL | Status: DC | PRN
  Administered 2021-10-07: 1000 mL

## 2021-10-07 SURGICAL SUPPLY — 37 items
BAG COUNTER SPONGE SURGICOUNT (BAG) ×1 IMPLANT
BUR EGG ELITE 5.0 (BURR) IMPLANT
CLEANER TIP ELECTROSURG 2X2 (MISCELLANEOUS) ×1 IMPLANT
DRAPE LAPAROTOMY 100X72X124 (DRAPES) ×1 IMPLANT
DRAPE MICROSCOPE SLANT 54X150 (MISCELLANEOUS) ×1 IMPLANT
DRAPE SURG 17X11 SM STRL (DRAPES) ×1 IMPLANT
DRAPE UTILITY XL STRL (DRAPES) ×1 IMPLANT
DRSG AQUACEL AG ADV 3.5X 6 (GAUZE/BANDAGES/DRESSINGS) IMPLANT
DURAPREP 26ML APPLICATOR (WOUND CARE) ×1 IMPLANT
ELECT BLADE 4.0 EZ CLEAN MEGAD (MISCELLANEOUS)
ELECT REM PT RETURN 9FT ADLT (ELECTROSURGICAL) ×1
ELECTRODE BLDE 4.0 EZ CLN MEGD (MISCELLANEOUS) IMPLANT
ELECTRODE REM PT RTRN 9FT ADLT (ELECTROSURGICAL) ×1 IMPLANT
GLOVE BIOGEL PI IND STRL 7.5 (GLOVE) ×1 IMPLANT
GLOVE SURG SS PI 7.0 STRL IVOR (GLOVE) ×1 IMPLANT
GLOVE SURG SS PI 8.0 STRL IVOR (GLOVE) ×2 IMPLANT
GOWN STRL REUS W/ TWL LRG LVL3 (GOWN DISPOSABLE) ×1 IMPLANT
GOWN STRL REUS W/ TWL XL LVL3 (GOWN DISPOSABLE) ×1 IMPLANT
GOWN STRL REUS W/TWL LRG LVL3 (GOWN DISPOSABLE) ×1
GOWN STRL REUS W/TWL XL LVL3 (GOWN DISPOSABLE) ×1
IV CATH 14GX2 1/4 (CATHETERS) ×1 IMPLANT
KIT BASIN OR (CUSTOM PROCEDURE TRAY) ×1 IMPLANT
KIT POSITION SURG JACKSON T1 (MISCELLANEOUS) IMPLANT
NDL 22X1.5 STRL (OR ONLY) (MISCELLANEOUS) ×1 IMPLANT
NDL SPNL 18GX3.5 QUINCKE PK (NEEDLE) ×2 IMPLANT
NEEDLE 22X1.5 STRL (OR ONLY) (MISCELLANEOUS) ×1 IMPLANT
NEEDLE SPNL 18GX3.5 QUINCKE PK (NEEDLE) ×2 IMPLANT
PACK LAMINECTOMY NEURO (CUSTOM PROCEDURE TRAY) ×1 IMPLANT
SPONGE SURGIFOAM ABS GEL 100 (HEMOSTASIS) ×1 IMPLANT
SPONGE T-LAP 4X18 ~~LOC~~+RFID (SPONGE) IMPLANT
STAPLER VISISTAT (STAPLE) IMPLANT
SYR 3ML LL SCALE MARK (SYRINGE) ×1 IMPLANT
TOWEL GREEN STERILE (TOWEL DISPOSABLE) ×1 IMPLANT
TOWEL GREEN STERILE FF (TOWEL DISPOSABLE) ×1 IMPLANT
TRAY FOLEY MTR SLVR 16FR STAT (SET/KITS/TRAYS/PACK) ×1 IMPLANT
WIPE CHG 2% PREP (PERSONAL CARE ITEMS) ×1 IMPLANT
YANKAUER SUCT BULB TIP NO VENT (SUCTIONS) ×1 IMPLANT

## 2021-10-07 NOTE — Anesthesia Postprocedure Evaluation (Signed)
Anesthesia Post Note  Patient: [name]  Procedure(s) Performed: Microlumbar decompression L4-5, possible L5-S1     Patient location during evaluation: PACU Anesthesia Type: General Level of consciousness: awake and alert Pain management: pain level controlled Vital Signs Assessment: post-procedure vital signs reviewed and stable Respiratory status: spontaneous breathing, nonlabored ventilation, respiratory function stable and patient connected to nasal cannula oxygen Cardiovascular status: blood pressure returned to baseline and stable Postop Assessment: no apparent nausea or vomiting Anesthetic complications: no   No notable events documented.  Last Vitals:  Vitals:   10/07/21 1715 10/07/21 1729  BP: (!) 150/81 (!) 147/73  Pulse: 74 87  Resp: 11 18  Temp: 36.8 C   SpO2: 97% 99%    Last Pain:  Vitals:   10/07/21 1547  TempSrc:   PainSc: [place]

## 2021-10-07 NOTE — Interval H&P Note (Signed)
History and Physical Interval Note:  10/07/2021 12:43 PM  [name]  has presented today for surgery, with the diagnosis of Herniated disc L4-5.  The various methods of treatment have been discussed with the patient and family. After consideration of risks, benefits and other options for treatment, the patient has consented to  Procedure(s): Microlumbar decompression L4-5, possible L5-S1 (N/A) as a surgical intervention.  The patient's history has been reviewed, patient examined, no change in status, stable for surgery.  I have reviewed the patient's chart and labs.  Questions were answered to the patient's satisfaction.     [name]

## 2021-10-07 NOTE — Op Note (Signed)
NAMECLERANCE, UMLAND MEDICAL RECORD NO: [id number] ACCOUNT NO: [id number] DATE OF BIRTH: 01/11/1966 FACILITY: [facility] LOCATION: [facility] PHYSICIAN: Johnn Hai, MD  Operative Report   DATE OF PROCEDURE: 10/07/2021  PREOPERATIVE DIAGNOSIS:  Spinal stenosis, herniated nucleus pulposus, L4-L5, right.  POSTOPERATIVE DIAGNOSIS:  Spinal stenosis, herniated nucleus pulposus, L4-L5, right.  PROCEDURE PERFORMED:   1.  Microdiskectomy L4-L5, right. 2.  Hemilaminotomy with foraminotomies of L5, L4, right. 3.  Lysis of epidural venous plexus.  ANESTHESIA:  General.  ASSISTANT:  Nehemiah Massed, PA  HISTORY:  A 56 year old male with right lower extremity radicular pain, foot drop secondary to extruded fragment at L4-L5 compressing the nerve root.  It extruded and migrated caudad and  compressing the L5 nerve root.  We discussed decompression due to  the foot drop and severe pain.  Risks and benefits discussed including bleeding, infection, damage to neurovascular structures, no change in symptoms, worsening symptoms, DVT, PE, anesthetic complications.  We discussed possibility of requiring access to  L5-S1 because the disk herniation was beneath the hemilamina.  TECHNIQUE:  With the patient in supine position, after induction of adequate general anesthesia and 2 grams Kefzol, placed prone on the Wilson frame.  All bony prominences were well padded.  Lumbar region was prepped and draped in the usual sterile  fashion.  Two 18-gauge spinal needles utilized to localize L4-L5 interspace, confirmed with x-ray.  Incision was made from above the spinous process of L4 to below the spinous process of L5.  Subcutaneous tissue was dissected.  Electrocautery utilized to  achieve hemostasis.  0.25% Marcaine with epinephrine was infiltrated in subcutaneous tissue.  Dorsal lumbar fascia divided in line with skin incision.  Paraspinous muscle elevated from lamina of L4-L5 and cephalad portion of L5-S1.  Subcutaneous  tissue  was dissected.  Electrocautery was utilized to achieve hemostasis.  Paraspinous muscle elevated from lamina of L4 and L5.  McCulloch retractor was placed.  Operating microscope was draped and brought in the surgical field after confirmatory radiograph  was obtained.  Hemilaminotomy of the caudad edge of L4 was performed with 2 and 3 mm Kerrison.  A straight curette was utilized to detach ligamentum flavum from the cephalad edge of L5.  The lamina of L5 was shingled and had a vertical orientation.  I  used micro curette to detach ligamentum flavum from the cephalad edge of L5.  I then removed ligamentum flavum from the interspace, identified the nerve root proximally, traced it distally.  I protected the nerve root, decompressed the lateral recess to  the medial border of pedicle.  Hypertrophic venous plexus was noted.  There was significant tethering of the L5 nerve root with compression from the disk herniation that was identified beneath the L5 nerve root that had migrated caudad to behind the  vertebral body of L5.  I performed a generous foraminotomy of L5 and hemilaminotomy of the cephalad edge of L5.  I then protected the L5 nerve root and further decompressed the lateral recess and medial border of the pedicle. Venous plexus was cauterized  and divided.  The herniated disk was noted beneath the L5 nerve root.  Granulation tissue was noted around the disk.  This was entered and I was able to mobilize 2 large fragments of disk material from beneath the L5 nerve root and the thecal sac.  This  was gently mobilized and retrieved with a micropituitary. Normal disk was visualized.  Following this, we explored proximal to this herniated fragment to the disk  space.  Gently mobilized the shoulder of the root.  Venous plexus was cauterized.  We used  thrombin-soaked Gelfoam and topical TXA to help with hemostasis.  I performed annulotomy at L4-L5.  I removed a small fragment from the disk space.  This  was then irrigated.  I checked then the foramen of L4 and the foramen of L5, no residual disk  herniation.  Beneath thecal sac in the shoulder and the axilla of the L5 root, there was no residual disk herniation.  There was 1 cm excursion of the L5 root medial to pedicle without tension.  I placed TXA in the laminotomy defect for hemostasis and  patties.  I obtained a confirmatory radiograph with a Penfield in the disk space at L4-L5.  I then removed the patty and evacuated the TXA.  Copiously irrigated with antibiotic irrigation.  No evidence of CSF leakage or active bleeding.  I then removed  the [facility] retractor.  Paraspinous muscles copiously irrigated, cauterized any minor bleeding.  I then closed the dorsal lumbar fascia with 1 Vicryl in interrupted figure-of-eight sutures, subcutaneous with 2-0 and skin with Prolene.  Sterile dressing  applied, placed supine on the hospital bed, extubated without difficulty and transported to the recovery room in satisfactory condition.  The patient tolerated the procedure well.  No complications.  Assistant, Nehemiah Massed, PA.  Approximate blood loss 50 mL.   VAI D: 10/07/2021 3:42:10 pm T: 10/07/2021 9:06:00 pm  JOB: [id number]/ [id number]

## 2021-10-07 NOTE — Transfer of Care (Signed)
Immediate Anesthesia Transfer of Care Note  Patient: [name]  Procedure(s) Performed: Microlumbar decompression L4-5, possible L5-S1  Patient Location: PACU  Anesthesia Type:General  Level of Consciousness: awake, alert  and oriented  Airway & Oxygen Therapy: Patient Spontanous Breathing  Post-op Assessment: Report given to RN and Post -op Vital signs reviewed and stable  Post vital signs: Reviewed and stable  Last Vitals:  Vitals Value Taken Time  BP 122/71 10/07/21 1600  Temp    Pulse 75 10/07/21 1605  Resp 10 10/07/21 1605  SpO2 95 % 10/07/21 1605  Vitals shown include unvalidated device data.  Last Pain:  Vitals:   10/07/21 1547  TempSrc:   PainSc: 8          Complications: No notable events documented.

## 2021-10-07 NOTE — Discharge Instructions (Signed)

## 2021-10-07 NOTE — Anesthesia Procedure Notes (Signed)
Procedure Name: Intubation Date/Time: 10/07/2021 1:11 PM  Performed by: Minerva Ends, CRNAPre-anesthesia Checklist: Patient identified, Emergency Drugs available, Suction available and Patient being monitored Patient Re-evaluated:Patient Re-evaluated prior to induction Oxygen Delivery Method: Circle system utilized Preoxygenation: Pre-oxygenation with 100% oxygen Induction Type: IV induction Ventilation: Mask ventilation without difficulty Laryngoscope Size: Mac and 4 Grade View: Grade I Tube type: Oral Tube size: 7.5 mm Number of attempts: 1 Airway Equipment and Method: Stylet and Oral airway Placement Confirmation: ETT inserted through vocal cords under direct vision, positive ETCO2 and breath sounds checked- equal and bilateral Secured at: 21 cm Tube secured with: Tape Dental Injury: Teeth and Oropharynx as per pre-operative assessment

## 2021-10-07 NOTE — Brief Op Note (Signed)
10/07/2021  12:43 PM  PATIENT:  [name]  56 y.o. male  PRE-OPERATIVE DIAGNOSIS:  Herniated disc L4-5  POST-OPERATIVE DIAGNOSIS:  * No post-op diagnosis entered *  PROCEDURE:  Procedure(s): Microlumbar decompression L4-5, possible L5-S1 (N/A)  SURGEON:  Surgeon(s) and Role:    Susa Day, MD - Primary  PHYSICIAN ASSISTANT:   ASSISTANTSMancel Bale   ANESTHESIA:   general  EBL:  50  BLOOD ADMINISTERED:none  DRAINS: none   LOCAL MEDICATIONS USED:  MARCAINE     SPECIMEN:  No Specimen  DISPOSITION OF SPECIMEN:  N/A  COUNTS:  YES  TOURNIQUET:  * No tourniquets in log *  DICTATION: .Other Dictation: Dictation Number   [id number]  PLAN OF CARE: Admit for overnight observation  PATIENT DISPOSITION:  PACU - hemodynamically stable.   Delay start of Pharmacological VTE agent (>24hrs) due to surgical blood loss or risk of bleeding: yes

## 2021-10-07 NOTE — Progress Notes (Signed)
Pt has an Insulin Pump located at the right lower abdomen. Pt reduced his basal rate at midnight. Pt's cbg upon arrival to Pre-Op was 237. Dr. Kalman Shan with anesthesia made aware, and the pt gave himself 5 units of Humalog via the pump.

## 2021-10-08 ENCOUNTER — Encounter (HOSPITAL_COMMUNITY): Payer: Self-pay | Admitting: Specialist

## 2021-10-08 DIAGNOSIS — M48061 Spinal stenosis, lumbar region without neurogenic claudication: Secondary | ICD-10-CM | POA: Diagnosis not present

## 2021-10-08 LAB — BASIC METABOLIC PANEL
Anion gap: 8 (ref 5–15)
BUN: 23 mg/dL — ABNORMAL HIGH (ref 6–20)
CO2: 25 mmol/L (ref 22–32)
Calcium: 8.5 mg/dL — ABNORMAL LOW (ref 8.9–10.3)
Chloride: 88 mmol/L — ABNORMAL LOW (ref 98–111)
Creatinine, Ser: 1.28 mg/dL — ABNORMAL HIGH (ref 0.61–1.24)
GFR, Estimated: >60 mL/min (ref >60)
Glucose, Bld: 310 mg/dL — ABNORMAL HIGH (ref 70–99)
Potassium: 5 mmol/L (ref 3.5–5.1)
Sodium: 121 mmol/L — ABNORMAL LOW (ref 135–145)

## 2021-10-08 LAB — GLUCOSE, CAPILLARY
Glucose-Capillary: 265 mg/dL — ABNORMAL HIGH (ref 70–99)
Glucose-Capillary: 270 mg/dL — ABNORMAL HIGH (ref 70–99)
Glucose-Capillary: 307 mg/dL — ABNORMAL HIGH (ref 70–99)
Glucose-Capillary: 399 mg/dL — ABNORMAL HIGH (ref 70–99)

## 2021-10-08 NOTE — Progress Notes (Signed)
  Patient alert and oriented, mae's well, voiding adequate amount of urine, swallowing without difficulty, no c/o pain at time of discharge. Patient discharged home with family. Script and discharged instructions given to patient. Patient and family stated understanding of instructions given and was educated on salt intake.Patient has an appointment with Dr Tonita Cong.

## 2021-10-08 NOTE — Evaluation (Signed)
Occupational Therapy Evaluation Patient Details Name: [name] MRN: [id number] DOB: [birth year] Today's Date: 10/08/2021   History of Present Illness Pt is a 56y.o. male s/p microlumbar decompression L4-L5. PMH includes DM, GERD, HLD, HTN and anemia.   Clinical Impression   Pt demonstrated ability to complete ADL and functional mobility at modified independent level with use of RW and adherence to back precautions. Pt with no additional OT needs and no acute PT needs. Pt reports he was seeing OPPT prior to surgery and has plan to return to OPPT per MD orders. Patient evaluated by Occupational Therapy with no further acute OT needs identified. All education has been completed and the patient has no further questions. See below for any follow-up Occupational Therapy or equipment needs. OT to sign off. Thank you for referral.        Recommendations for follow up therapy are one component of a multi-disciplinary discharge planning process, led by the attending physician.  Recommendations may be updated based on patient status, additional functional criteria and insurance authorization.   Follow Up Recommendations  No OT follow up    Assistance Recommended at Discharge PRN  Patient can return home with the following  PRN assistance from his wife for setup and safety    Functional Status Assessment  Patient has not had a recent decline in their functional status  Equipment Recommendations  None recommended by OT    Recommendations for Other Services       Precautions / Restrictions Precautions Precautions: Back Precaution Booklet Issued: Yes (comment) Precaution Comments: reviewed and provided with handout Required Braces or Orthoses:  (none) Restrictions Weight Bearing Restrictions: No      Mobility Bed Mobility Overal bed mobility: Modified Independent                  Transfers Overall transfer level: Modified independent Equipment used: Rolling walker (2  wheels)               General transfer comment: modified independent wiht RW      Balance Overall balance assessment: Modified Independent                                         ADL either performed or assessed with clinical judgement   ADL Overall ADL's : Modified independent                                       General ADL Comments: pt dressed and demonstrated ability to complete ADL at modified independent level with adherence to back precautions. pt able to ambulate short distances without RW, improved stability with his RW     Vision         Perception     Praxis      Pertinent Vitals/Pain Pain Assessment Pain Assessment: Faces Faces Pain Scale: Hurts a little bit Pain Location: incision site Pain Descriptors / Indicators: Sore Pain Intervention(s): Limited activity within patient's tolerance, Monitored during session     Hand Dominance Right   Extremity/Trunk Assessment Upper Extremity Assessment Upper Extremity Assessment: Overall WFL for tasks assessed   Lower Extremity Assessment Lower Extremity Assessment: Overall WFL for tasks assessed;RLE deficits/detail RLE Deficits / Details: reports feeling like he is walking on the lateral aspect of his foot at times and sensation  feeling "off" but not numb. overall WFL   Cervical / Trunk Assessment Cervical / Trunk Assessment: Back Surgery   Communication Communication Communication: No difficulties   Cognition Arousal/Alertness: Awake/alert Behavior During Therapy: WFL for tasks assessed/performed Overall Cognitive Status: Within Functional Limits for tasks assessed                                       General Comments  vss    Exercises     Shoulder Instructions      Home Living Family/patient expects to be discharged to:: Private residence Living Arrangements: Spouse/significant other Available Help at Discharge: Family Type of Home:  House Home Access: Stairs to enter Technical brewer of Steps: 4 Entrance Stairs-Rails: None Home Layout: One level     Bathroom Shower/Tub: Teacher, early years/pre: Standard     Home Equipment: Conservation officer, nature (2 wheels)          Prior Functioning/Environment Prior Level of Function : Independent/Modified Independent             Mobility Comments: pt reports using RW 1 month ago due to herniation. 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        OT Problem List: Decreased knowledge of precautions      OT Treatment/Interventions:      OT Goals(Current goals can be found in the care plan section) Acute Rehab OT Goals Patient Stated Goal: to go home today OT Goal Formulation: With patient Time For Goal Achievement: 10/15/21 Potential to Achieve Goals: Good  OT Frequency:      Co-evaluation              AM-PAC OT "6 Clicks" Daily Activity     Outcome Measure Help from another person eating meals?: None Help from another person taking care of personal grooming?: None Help from another person toileting, which includes using toliet, bedpan, or urinal?: None Help from another person bathing (including washing, rinsing, drying)?: None Help from another person to put on and taking off regular upper body clothing?: None Help from another person to put on and taking off regular lower body clothing?: None 6 Click Score: 24   End of Session Equipment Utilized During Treatment: Rolling walker (2 wheels) Nurse Communication: Mobility status  Activity Tolerance: Patient tolerated treatment well Patient left: in chair;with call bell/phone within reach  OT Visit Diagnosis: Other abnormalities of gait and mobility (R26.89)                Time: 8016-5537 OT Time Calculation (min): 21 min Charges:  OT General Charges $OT Visit: 1 Visit OT Evaluation $OT Eval Low Complexity: 1 Low  Adely Facer OTR/L Acute  Rehabilitation Services Office: [phone]   Wyn Forster 10/08/2021, 9:07 AM

## 2021-10-08 NOTE — Progress Notes (Signed)
    Subjective: Patient seen in rounds for Dr. Tonita Cong.  Patient reports pain as mild to moderate.  Denies N/V/CP/SOB/Abd pain. He states his numbness in RLE is improving and limited to his big toe.  His wife is at bedside and he is wearing his insulin pump.   Objective:   VITALS:   Vitals:   10/07/21 2103 10/08/21 0001 10/08/21 0424 10/08/21 0804  BP: (!) 156/94 (!) 147/84 139/83 126/67  Pulse: 85 75 89 97  Resp: '20 20 18 20  '$ Temp: 97.7 F (36.5 C) 98.5 F (36.9 C) 98.5 F (36.9 C) 98.5 F (36.9 C)  TempSrc: Oral Oral Oral Oral  SpO2: 98% 97% 97% 100%  Weight:      Height:        Patient is sitting on the edge of the bed. Wife at bedside. NAD.  Neurologically intact ABD soft Neurovascular intact Sensation intact distally Intact pulses distally Dorsiflexion/Plantar flexion intact Incision: dressing C/D/I No cellulitis present   Lab Results  Component Value Date   WBC 10.8 (H) 10/03/2021   HGB 15.4 10/03/2021   HCT 43.1 10/03/2021   MCV 93.9 10/03/2021   PLT 296 10/03/2021   BMET    Component Value Date/Time   NA 129 (L) 10/03/2021 1032   K 5.4 (H) 10/03/2021 1032   CL 90 (L) 10/03/2021 1032   CO2 26 10/03/2021 1032   GLUCOSE 332 (H) 10/03/2021 1032   BUN 15 10/03/2021 1032   CREATININE 1.13 10/03/2021 1032   CALCIUM 9.5 10/03/2021 1032   GFRNONAA >60 10/03/2021 1032     Assessment/Plan: 1 Day Post-Op   Principal Problem:   HNP (herniated nucleus pulposus), lumbar   WBAT utilizing back precautions. No bending, twisting or lifting.  Dispo: Patient has insulin pump. Discharge home today.    Charlott Rakes, PA-C 10/08/2021, 10:42 AM   [facility]  [place] [address], [place], [place] [address] Phone: [phone] [email or web address] Facebook  [employer]

## 2021-10-08 NOTE — Progress Notes (Signed)
PT Cancellation Note  Patient Details Name: [name] MRN: [id number] DOB: [birth year]   Cancelled Treatment:    Reason Eval/Treat Not Completed: PT screened, no needs identified, will sign off Per OT, no skilled PT needs. Please re-consult if needs change.   Lou Miner, DPT  Acute Rehabilitation Services  Office: [phone]    [name] 10/08/2021, 8:49 AM

## 2021-10-10 MED FILL — Thrombin (Recombinant) For Soln 20000 Unit: CUTANEOUS | Qty: 1 | Status: AC

## 2021-10-13 ENCOUNTER — Encounter: Payer: Self-pay | Admitting: Family Medicine

## 2021-11-24 ENCOUNTER — Encounter: Payer: Self-pay | Admitting: Family Medicine

## 2021-11-28 NOTE — Telephone Encounter (Signed)
Patient need OV 

## 2021-12-15 ENCOUNTER — Ambulatory Visit: Payer: Commercial Managed Care - PPO | Admitting: Nurse Practitioner

## 2021-12-22 ENCOUNTER — Ambulatory Visit (INDEPENDENT_AMBULATORY_CARE_PROVIDER_SITE_OTHER): Payer: Commercial Managed Care - PPO | Admitting: Nurse Practitioner

## 2021-12-22 ENCOUNTER — Encounter: Payer: Self-pay | Admitting: Nurse Practitioner

## 2021-12-22 VITALS — BP 160/88 | HR 88 | Ht 70.0 in | Wt 174.6 lb

## 2021-12-22 DIAGNOSIS — I10 Essential (primary) hypertension: Secondary | ICD-10-CM | POA: Diagnosis not present

## 2021-12-22 DIAGNOSIS — E109 Type 1 diabetes mellitus without complications: Secondary | ICD-10-CM

## 2021-12-22 DIAGNOSIS — E559 Vitamin D deficiency, unspecified: Secondary | ICD-10-CM | POA: Diagnosis not present

## 2021-12-22 DIAGNOSIS — E782 Mixed hyperlipidemia: Secondary | ICD-10-CM | POA: Diagnosis not present

## 2021-12-22 LAB — POCT GLYCOSYLATED HEMOGLOBIN (HGB A1C): Hemoglobin A1C: 7.4 % — AB (ref 4.0–5.6)

## 2021-12-22 MED ORDER — OMNIPOD 5 DEXG7G6 PODS GEN 5 MISC: 6 refills | Status: DC

## 2021-12-22 MED ORDER — INSULIN LISPRO 100 UNIT/ML IJ SOLN: INTRAMUSCULAR | 3 refills | Status: DC

## 2021-12-22 NOTE — Progress Notes (Signed)
Endocrinology Follow Up Note       12/22/2021, 10:11 AM   Subjective:    Patient ID: [name], male    DOB: Aug 06, 1965.  [name] is being seen in follow up after being seen in consultation for management of currently uncontrolled symptomatic diabetes requested by  Vivi Barrack, MD.   Past Medical History:  Diagnosis Date   Allergy    Anemia    Diabetes mellitus without complication ([place])    type 1 per patient   GERD (gastroesophageal reflux disease)    HLD (hyperlipidemia)    Hypertension     Past Surgical History:  Procedure Laterality Date   ANKLE SURGERY Right    LUMBAR LAMINECTOMY/DECOMPRESSION MICRODISCECTOMY N/A 10/07/2021   Procedure: Microlumbar decompression L4-5, possible L5-S1;  Surgeon: Susa Day, MD;  Location: [facility];  Service: Orthopedics;  Laterality: N/A;   UPPER GASTROINTESTINAL ENDOSCOPY     VASECTOMY  1994    Social History   Socioeconomic History   Marital status: Married    Spouse name: Not on file   Number of children: 1   Years of education: Not on file   Highest education level: Not on file  Occupational History   Occupation: Architect  Tobacco Use   Smoking status: Every Day    Packs/day: 1.00    Years: 30.00    Total pack years: 30.00    Types: Cigarettes   Smokeless tobacco: Never  Vaping Use   Vaping Use: Never used  Substance and Sexual Activity   Alcohol use: Not Currently    Alcohol/week: 0.0 - 8.0 standard drinks of alcohol   Drug use: No   Sexual activity: Yes  Other Topics Concern   Not on file  Social History Narrative   Not on file   Social Determinants of Health   Financial Resource Strain: Not on file  Food Insecurity: Not on file  Transportation Needs: Not on file  Physical Activity: Not on file  Stress: Not on file  Social Connections: Not on file    Family History  Problem Relation Age of Onset   COPD Mother     Diabetes Mother    Early death Father 73       MI   Heart attack Father    Lymphoma Sister 25   Hypertension Brother    Diabetes Maternal Grandmother    Stroke Neg Hx    Alcohol abuse Neg Hx    Drug abuse Neg Hx    Hyperlipidemia Neg Hx    Kidney disease Neg Hx    Colon cancer Neg Hx    Esophageal cancer Neg Hx    Stomach cancer Neg Hx    Rectal cancer Neg Hx     Outpatient Encounter Medications as of 12/22/2021  Medication Sig   Blood Pressure KIT Use daily as needed to check blood pressure   Continuous Blood Gluc Sensor (DEXCOM G6 SENSOR) MISC Change sensor every 10 days as directed   Continuous Blood Gluc Transmit (DEXCOM G6 TRANSMITTER) MISC Change transmitter every 90 days as directed.   esomeprazole (NEXIUM) 40 MG capsule Take 1 capsule (40 mg total) by mouth daily at 12  noon.   ferrous sulfate 325 (65 FE) MG tablet Take 650 mg by mouth daily.   gabapentin (NEURONTIN) 300 MG capsule Take 300 mg by mouth 3 (three) times daily.   Insulin Disposable Pump (OMNIPOD 5 G6 INTRO, GEN 5,) KIT Change pod every 48-72 hrs   irbesartan (AVAPRO) 300 MG tablet Take 1 tablet (300 mg total) by mouth daily.   sildenafil (REVATIO) 20 MG tablet Take 1-5 tablets (20-100 mg total) by mouth daily as needed (erectile dysfunction).   [DISCONTINUED] Insulin Disposable Pump (OMNIPOD 5 G6 POD, GEN 5,) MISC Change pod every 48-72 hrs   [DISCONTINUED] insulin lispro (HUMALOG) 100 UNIT/ML injection Use with Omnipod for TDD around 45 units per day   Insulin Disposable Pump (OMNIPOD 5 G6 POD, GEN 5,) MISC Change pod every 48-72 hrs   insulin lispro (HUMALOG) 100 UNIT/ML injection Use with Omnipod for TDD around 45 units per day   oxyCODONE (OXY IR/ROXICODONE) 5 MG immediate release tablet Take 1 tablet (5 mg total) by mouth every 4 (four) hours as needed for severe pain. (Patient not taking: Reported on 12/22/2021)   No facility-administered encounter medications on file as of 12/22/2021.     ALLERGIES: Allergies  Allergen Reactions   Crestor [Rosuvastatin Calcium] Other (See Comments)    Muscle aches   Iron Other (See Comments)    "Tore up my stomach"  "Liquid Iron"   Lipitor [Atorvastatin Calcium] Other (See Comments)    Muscles aches   Statins Other (See Comments)    Muscle aches    VACCINATION STATUS: Immunization History  Administered Date(s) Administered   Influenza,inj,Quad PF,6+ Mos 01/18/2016, 10/17/2017, 11/06/2019, 10/28/2020   Influenza,inj,quad, With Preservative 10/30/2018   Influenza-Unspecified 10/17/2017   PFIZER(Purple Top)SARS-COV-2 Vaccination 04/10/2019, 05/05/2019   Pneumococcal Polysaccharide-23 10/21/2014   Tdap 06/02/2013    Diabetes He presents for his follow-up diabetic visit. He has type 1 diabetes mellitus. Onset time: Diagnosed with LADA at age of 43. His disease course has been improving. There are no hypoglycemic associated symptoms. Associated symptoms include blurred vision and fatigue. There are no hypoglycemic complications. Symptoms are stable. There are no diabetic complications. Risk factors for coronary artery disease include diabetes mellitus, dyslipidemia, family history, male sex and tobacco exposure. Current diabetic treatment includes insulin pump. He is compliant with treatment most of the time. His weight is decreasing steadily. He is following a generally healthy diet. When asked about meal planning, he reported none. He has not had a previous visit with a dietitian. He participates in exercise daily. His home blood glucose trend is decreasing steadily. His overall blood glucose range is 140-180 mg/dl. (He presents today with his CGM and pump showing improving glycemic profile overall.  His POCT A1c today is 7.4%, improving from last visit of 7.7%.  He did have his back surgery since his last visit and is recovering reasonably well.  Analysis of his CGM shows TIR 62%, TAR 38%, TBR 0% with a GMI Of 7.5%.  ) An ACE  inhibitor/angiotensin II receptor blocker is being taken. He does not see a podiatrist.Eye exam is not current.     Review of systems  Constitutional: + steadily decreasing body weight,  current Body mass index is 25.05 kg/m. , no fatigue, no subjective hyperthermia, no subjective hypothermia Eyes: no blurry vision, no xerophthalmia ENT: no sore throat, no nodules palpated in throat, no dysphagia/odynophagia, no hoarseness Cardiovascular: no chest pain, no shortness of breath, no palpitations, no leg swelling Respiratory: no cough, no shortness of  breath Gastrointestinal: no nausea/vomiting/diarrhea Musculoskeletal: back pain improving, right leg nerve pain from recent surgery Skin: no rashes, no hyperemia Neurological: no tremors,  no dizziness Psychiatric: no depression, no anxiety  Objective:     BP (!) 160/88 (BP Location: Right Arm, Patient Position: Sitting, Cuff Size: Normal)   Pulse 88   Ht _0  (1.778 m)   Wt 174 lb 9.6 oz (79.2 kg)   BMI 25.05 kg/m   Wt Readings from Last 3 Encounters:  12/22/21 174 lb 9.6 oz (79.2 kg)  10/07/21 179 lb (81.2 kg)  09/26/21 180 lb (81.6 kg)     BP Readings from Last 3 Encounters:  12/22/21 (!) 160/88  10/08/21 126/67  10/03/21 (!) 169/94      Physical Exam- Limited  Constitutional:  Body mass index is 25.05 kg/m. , not in acute distress, normal state of mind Eyes:  EOMI, no exophthalmos Musculoskeletal: no gross deformities, strength intact in all four extremities, no gross restriction of joint movements Skin:  no rashes, no hyperemia Neurological: no tremor with outstretched hands   Diabetic Foot Exam - Simple   No data filed     CMP ( most recent) CMP     Component Value Date/Time   NA 121 (L) 10/08/2021 1024   K 5.0 10/08/2021 1024   CL 88 (L) 10/08/2021 1024   CO2 25 10/08/2021 1024   GLUCOSE 310 (H) 10/08/2021 1024   BUN 23 (H) 10/08/2021 1024   CREATININE 1.28 (H) 10/08/2021 1024   CALCIUM 8.5 (L)  10/08/2021 1024   PROT 7.3 02/02/2021 1046   ALBUMIN 4.1 02/02/2021 1046   AST 23 02/02/2021 1046   ALT 18 02/02/2021 1046   ALKPHOS 111 02/02/2021 1046   BILITOT 0.2 02/02/2021 1046   GFRNONAA >60 10/08/2021 1024   GFRAA >60 10/10/2017 0542     Diabetic Labs (most recent): Lab Results  Component Value Date   HGBA1C 7.4 (A) 12/22/2021   HGBA1C 7.7 (A) 09/12/2021   HGBA1C 7.1 (A) 04/25/2021   MICROALBUR 150 06/20/2021   MICROALBUR 17.0 (H) 01/18/2016     Lipid Panel ( most recent) Lipid Panel     Component Value Date/Time   CHOL 241 (H) 10/28/2020 0943   TRIG 77.0 10/28/2020 0943   HDL 90.50 10/28/2020 0943   CHOLHDL 3 10/28/2020 0943   VLDL 15.4 10/28/2020 0943   LDLCALC 135 (H) 10/28/2020 0943   LDLDIRECT 156.0 01/18/2016 1020      Lab Results  Component Value Date   TSH 1.78 02/02/2021   TSH 1.65 10/28/2020   TSH 2.15 01/18/2016           Assessment & Plan:   1) Type 1 diabetes mellitus without complication ([place])  He presents today with his CGM and pump showing improving glycemic profile overall.  His POCT A1c today is 7.4%, improving from last visit of 7.7%.  He did have his back surgery since his last visit and is recovering reasonably well.  Analysis of his CGM shows TIR 62%, TAR 38%, TBR 0% with a GMI Of 7.5%.    - [name] has currently uncontrolled symptomatic LADA DM since 56 years of age.   -Recent labs reviewed.  - I had a long discussion with him about the progressive nature of diabetes and the pathology behind its complications. -his diabetes is complicated by drastic fluctuations and severe hypoglycemia and he remains at a high risk for more acute and chronic complications which include CAD, CVA, CKD,  retinopathy, and neuropathy. These are all discussed in detail with him.  The following Lifestyle Medicine recommendations according to [facility]) were discussed and offered to patient and he agrees to  start the journey:  A. Whole Foods, Plant-based plate comprising of fruits and vegetables, plant-based proteins, whole-grain carbohydrates was discussed in detail with the patient.   A list for source of those nutrients were also provided to the patient.  Patient will use only water or unsweetened tea for hydration. B.  The need to stay away from risky substances including alcohol, smoking; obtaining 7 to 9 hours of restorative sleep, at least 150 minutes of moderate intensity exercise weekly, the importance of healthy social connections,  and stress reduction techniques were discussed. C.  A full color page of  Calorie density of various food groups per pound showing examples of each food groups was provided to the patient.  - Nutritional counseling repeated at each appointment due to patients tendency to fall back in to old habits.  - The patient admits there is a room for improvement in their diet and drink choices. -  Suggestion is made for the patient to avoid simple carbohydrates from their diet including Cakes, Sweet Desserts / Pastries, Ice Cream, Soda (diet and regular), Sweet Tea, Candies, Chips, Cookies, Sweet Pastries, Store Bought Juices, Alcohol in Excess of 1-2 drinks a day, Artificial Sweeteners, Coffee Creamer, and "Sugar-free" Products. This will help patient to have stable blood glucose profile and potentially avoid unintended weight Golden.   - I encouraged the patient to switch to unprocessed or minimally processed complex starch and increased protein intake (animal or plant source), fruits, and vegetables.   - Patient is advised to stick to a routine mealtimes to eat 3 meals a day and avoid unnecessary snacks (to snack only to correct hypoglycemia).  - I have approached him with the following individualized plan to manage his diabetes and patient agrees:   -Given his improved glycemic profile, no changes will be made to his Omnipod settings today.  -he is encouraged to continue  monitoring glucose 4 times daily (using his CGM), before meals and before bed, and to call the clinic if he has readings less than 70 or above 300 for 3 tests in a row.    - he is warned not to take insulin without proper monitoring per orders. - Adjustment parameters are given to him for hypo and hyperglycemia in writing.  - his Metformin was discontinued, risk outweighs benefit for this patient, given his type 1 diagnosis where insulin treatment is necessary.  - Specific targets for  A1c; LDL, HDL, and Triglycerides were discussed with the patient.  2) Blood Pressure /Hypertension:  his blood pressure is controlled to target.   he is advised to continue his current medications including Irbesartan 300 mg p.o. daily with breakfast.  3) Lipids/Hyperlipidemia:    Review of his recent lipid panel from 10/28/20 showed uncontrolled LDL at 135 . He is allergic to statin medications.  He is advised to avoid fried foods and butter.  4)  Weight/Diet:  his Body mass index is 25.05 kg/m.  -    he is NOT a candidate for weight loss.   Exercise, and detailed carbohydrates information provided  -  detailed on discharge instructions.  5) Chronic Care/Health Maintenance: -he is on ACEI/ARB and not on Statin medications and is encouraged to initiate and continue to follow up with Ophthalmology, Dentist, Podiatrist at least yearly or according to  recommendations, and advised to [place]. I have recommended yearly flu vaccine and pneumonia vaccine at least every 5 years; moderate intensity exercise for up to 150 minutes weekly; and sleep for at least 7 hours a day.  - he is advised to maintain close follow up with Vivi Barrack, MD for primary care needs, as well as his other providers for optimal and coordinated care.       I spent 40 minutes in the care of the patient today including review of labs from [place], Lipids, Thyroid Function, Hematology (current and previous including abstractions from  other facilities); face-to-face time discussing  his blood glucose readings/logs, discussing hypoglycemia and hyperglycemia episodes and symptoms, medications doses, his options of short and long term treatment based on the latest standards of care / guidelines;  discussion about incorporating lifestyle medicine;  and documenting the encounter. Risk reduction counseling performed per USPSTF guidelines to reduce obesity and cardiovascular risk factors.     Please refer to Patient Instructions for Blood Glucose Monitoring and Insulin/Medications Dosing Guide"  in media tab for additional information. Please  also refer to " Patient Self Inventory" in the Media  tab for reviewed elements of pertinent patient history.  [name] participated in the discussions, expressed understanding, and voiced agreement with the above plans.  All questions were answered to his satisfaction. he is encouraged to contact clinic should he have any questions or concerns prior to his return visit.     Follow up plan: - Return in about 3 months (around 03/23/2022) for Diabetes F/U with A1c in office, Previsit labs, Bring meter and logs.    Rayetta Pigg, [facility] [address] [place], [place] [address] Phone: [phone] Fax: [phone]  12/22/2021, 10:11 AM

## 2022-01-04 ENCOUNTER — Ambulatory Visit (INDEPENDENT_AMBULATORY_CARE_PROVIDER_SITE_OTHER): Payer: Commercial Managed Care - PPO | Admitting: Ophthalmology

## 2022-01-04 DIAGNOSIS — E113393 Type 2 diabetes mellitus with moderate nonproliferative diabetic retinopathy without macular edema, bilateral: Secondary | ICD-10-CM | POA: Diagnosis not present

## 2022-01-04 DIAGNOSIS — H25813 Combined forms of age-related cataract, bilateral: Secondary | ICD-10-CM

## 2022-01-04 DIAGNOSIS — H35033 Hypertensive retinopathy, bilateral: Secondary | ICD-10-CM | POA: Diagnosis not present

## 2022-01-04 DIAGNOSIS — I10 Essential (primary) hypertension: Secondary | ICD-10-CM

## 2022-01-04 NOTE — Progress Notes (Addendum)
[facility] Note  01/04/2022     CHIEF COMPLAINT Patient presents for Retina Evaluation   HISTORY OF PRESENT ILLNESS: [name] is a 56 y.o. male who presents to the clinic today for:   HPI     Retina Evaluation           Laterality: both eyes   Onset: 1 month ago   Duration: 1 month         Comments   Retina evaluation per Dr. Sherlene Shams for diabetic exam pt is reporting no vision changes noticed he has been having more watering in the left eye pt last reading 170 now A1C 6.9 1 month ago       Last edited by Parthenia Ames, COT on 01/04/2022  1:57 PM.    Pt is here on the referral of Dr. Sherlene Shams ([facility] [place]) for DM exam, pt sees them once a year for a routine exam, pt has never seen a retina specialist, he only wears reading glasses, pts A1c was 7.4 on 12.07.23, pt denies neuropathy or kidney disease  Referring physician: Vivi Barrack, MD [place],  [place] [address]  HISTORICAL INFORMATION:   Selected notes from the [place] Referred by Dr. Sherlene Shams for DM exam ([facility] [place]) LEE:  Ocular Hx- PMH-    CURRENT MEDICATIONS: No current outpatient medications on file. (Ophthalmic Drugs)   No current facility-administered medications for this visit. (Ophthalmic Drugs)   Current Outpatient Medications (Other)  Medication Sig   Blood Pressure KIT Use daily as needed to check blood pressure   Continuous Blood Gluc Sensor (DEXCOM G6 SENSOR) MISC Change sensor every 10 days as directed   Continuous Blood Gluc Transmit (DEXCOM G6 TRANSMITTER) MISC Change transmitter every 90 days as directed.   esomeprazole (NEXIUM) 40 MG capsule Take 1 capsule (40 mg total) by mouth daily at 12 noon.   ferrous sulfate 325 (65 FE) MG tablet Take 650 mg by mouth daily.   gabapentin (NEURONTIN) 300 MG capsule Take 300 mg by mouth 3 (three) times daily.   Insulin Disposable Pump (OMNIPOD 5 G6  INTRO, GEN 5,) KIT Change pod every 48-72 hrs   Insulin Disposable Pump (OMNIPOD 5 G6 POD, GEN 5,) MISC Change pod every 48-72 hrs   insulin lispro (HUMALOG) 100 UNIT/ML injection Use with Omnipod for TDD around 45 units per day   irbesartan (AVAPRO) 300 MG tablet Take 1 tablet (300 mg total) by mouth daily.   oxyCODONE (OXY IR/ROXICODONE) 5 MG immediate release tablet Take 1 tablet (5 mg total) by mouth every 4 (four) hours as needed for severe pain. (Patient not taking: Reported on 12/22/2021)   sildenafil (REVATIO) 20 MG tablet Take 1-5 tablets (20-100 mg total) by mouth daily as needed (erectile dysfunction).   No current facility-administered medications for this visit. (Other)      REVIEW OF SYSTEMS:    ALLERGIES Allergies  Allergen Reactions   Crestor [Rosuvastatin Calcium] Other (See Comments)    Muscle aches   Iron Other (See Comments)    "Tore up my stomach"  "Liquid Iron"   Lipitor [Atorvastatin Calcium] Other (See Comments)    Muscles aches   Statins Other (See Comments)    Muscle aches    PAST MEDICAL HISTORY Past Medical History:  Diagnosis Date   Allergy    Anemia    Diabetes mellitus without complication ([place])    type 1  per patient   GERD (gastroesophageal reflux disease)    HLD (hyperlipidemia)    Hypertension    Past Surgical History:  Procedure Laterality Date   ANKLE SURGERY Right    LUMBAR LAMINECTOMY/DECOMPRESSION MICRODISCECTOMY N/A 10/07/2021   Procedure: Microlumbar decompression L4-5, possible L5-S1;  Surgeon: Susa Day, MD;  Location: [facility];  Service: Orthopedics;  Laterality: N/A;   UPPER GASTROINTESTINAL ENDOSCOPY     VASECTOMY  1994    FAMILY HISTORY Family History  Problem Relation Age of Onset   COPD Mother    Diabetes Mother    Early death Father 19       MI   Heart attack Father    Lymphoma Sister 52   Hypertension Brother    Diabetes Maternal Grandmother    Stroke Neg Hx    Alcohol abuse Neg Hx    Drug abuse Neg Hx     Hyperlipidemia Neg Hx    Kidney disease Neg Hx    Colon cancer Neg Hx    Esophageal cancer Neg Hx    Stomach cancer Neg Hx    Rectal cancer Neg Hx     SOCIAL HISTORY Social History   Tobacco Use   Smoking status: Every Day    Packs/day: 1.00    Years: 30.00    Total pack years: 30.00    Types: Cigarettes   Smokeless tobacco: Never  Vaping Use   Vaping Use: Never used  Substance Use Topics   Alcohol use: Not Currently    Alcohol/week: 0.0 - 8.0 standard drinks of alcohol   Drug use: No         OPHTHALMIC EXAM:  Base Eye Exam     Visual Acuity (Snellen - Linear)       Right Left   Dist cc 20/20 20/20 -1    Correction: Glasses         Tonometry (Tonopen, 2:03 PM)       Right Left   Pressure 16 15         Pupils       Pupils   Right PERRL   Left PERRL         Visual Fields       Left Right    Full Full         Extraocular Movement       Right Left    Full, Ortho Full, Ortho         Neuro/Psych     Oriented x3: Yes   Mood/Affect: Normal         Dilation     Both eyes: 2.5% Phenylephrine @ 2:03 PM           Slit Lamp and Fundus Exam     Slit Lamp Exam       Right Left   Lids/Lashes Dermatochalasis - upper lid Dermatochalasis - upper lid   Conjunctiva/Sclera White and quiet White and quiet   Cornea trace PEE trace PEE   Anterior Chamber deep, clear, narrow temporal angle deep, clear, narrow temporal angle   Iris Round and dilated, No NVI Round and dilated, No NVI   Lens 1-2+ Nuclear sclerosis, 1-2+ Cortical cataract 1-2+ Nuclear sclerosis, 1-2+ Cortical cataract   Vitreous mild syneresis mild syneresis         Fundus Exam       Right Left   Disc Pink and Sharp Pink and Sharp, mild PPA/PPP   C/D Ratio 0.1 0.2   Macula Good foveal  reflex, scattered MA / DBH greatest nasal macula Flat, good foveal reflex, scattered MA / DBH greatest nasal macula   Vessels mild attenuation attenuated, mild copper wiring, mild  AV crossing changes   Periphery Attached, posterior DBH nasal to disc Attached, focal cluster of DBH and CWD nasal to disc           Refraction     Wearing Rx       Sphere Cylinder Axis Add   Right +0.75 +1.25 180 +2.25   Left +0.75 +0.75 170 +2.25            IMAGING AND PROCEDURES  Imaging and Procedures for 01/04/2022  OCT, Retina - OU - Both Eyes       Right Eye Quality was good. Central Foveal Thickness: 248. Progression has no prior data. Findings include normal foveal contour, no IRF, no SRF, intraretinal hyper-reflective material, vitreomacular adhesion (Focal IRHM and retinal thickening superior macula, trace cystic changes nasal macula (seen best on widefield)).   Left Eye Quality was good. Central Foveal Thickness: 262. Progression has no prior data. Findings include normal foveal contour, retinal drusen , intraretinal hyper-reflective material, intraretinal fluid, outer retinal atrophy (Focal ORA, cystic changes and IRHM IN macula).   Notes *Images captured and stored on drive  Diagnosis / Impression:  OD: Focal IRHM and retinal thickening superior macula, trace cystic changes nasal macula (seen best on widefield) OS: Focal ORA, cystic changes and IRHM IN macula Clinical management:  See below  Abbreviations: NFP - Normal foveal profile. CME - cystoid macular edema. PED - pigment epithelial detachment. IRF - intraretinal fluid. SRF - subretinal fluid. EZ - ellipsoid zone. ERM - epiretinal membrane. ORA - outer retinal atrophy. ORT - outer retinal tubulation. SRHM - subretinal hyper-reflective material. IRHM - intraretinal hyper-reflective material      Fluorescein Angiography Optos (Transit OD)       Right Eye Progression has no prior data. Early phase findings include staining, microaneurysm. Mid/Late phase findings include leakage, staining, microaneurysm (Scattered leaking MA posterior and parafovea).   Left Eye Progression has no prior data. Early  phase findings include staining, microaneurysm. Mid/Late phase findings include leakage, staining, microaneurysm (Mild leaking MA posterior pole, greatest IN mac).   Notes **Images stored on drive**  Impression: OD: Scattered leaking MA posterior and parafovea OS: Mild leaking MA posterior pole, greatest IN mac              ASSESSMENT/PLAN:    ICD-10-CM   1. Moderate nonproliferative diabetic retinopathy of both eyes without macular edema associated with type 2 diabetes mellitus (HCC)  I34.7425 OCT, Retina - OU - Both Eyes    2. Essential hypertension  I10     3. Hypertensive retinopathy of both eyes  H35.033 Fluorescein Angiography Optos (Transit OD)    4. Combined forms of age-related cataract of both eyes  H25.813      Mild nonproliferative diabetic retinopathy w/o DME, both eyes - The incidence, risk factors for progression, natural history and treatment options for diabetic retinopathy were discussed with patient.   - The need for close monitoring of blood glucose, blood pressure, and serum lipids, avoiding cigarette or any type of tobacco, and the need for long term follow up was also discussed with patient. - exam shows scattered MA / DBH greatest nasal macula - FA (12.20.23) shows  - OCT without diabetic macular edema, both eyes  - f/u in 6 weeks -- DFE/OCT  2,3. Hypertensive retinopathy OU - discussed importance  of tight BP control - monitor  4. Mixed Cataract OU - The symptoms of cataract, surgical options, and treatments and risks were discussed with patient. - discussed diagnosis and progression - not yet visually significant - monitor for now    Ophthalmic Meds Ordered this visit:  No orders of the defined types were placed in this encounter.      Return in about 6 weeks (around 02/15/2022) for f/u NPDR OU, DFE, OCT.  There are no Patient Instructions on file for this visit.   Explained the diagnoses, plan, and follow up with the patient and  they expressed understanding.  Patient expressed understanding of the importance of proper follow up care.   This document serves as a record of services personally performed by Gardiner Sleeper, MD, PhD. It was created on their behalf by San Jetty. Owens Shark, OA an ophthalmic technician. The creation of this record is the provider's dictation and/or activities during the visit.    Electronically signed by: San Jetty. Owens Shark, [place] 12.20.2023 4:52 PM   Gardiner Sleeper, M.D., Ph.D. Diseases & Surgery of the Retina and Vitreous [facility]: M myopia (nearsighted); A astigmatism; H hyperopia (farsighted); P presbyopia; Mrx spectacle prescription;  CTL contact lenses; OD right eye; OS left eye; OU both eyes  XT exotropia; ET esotropia; PEK punctate epithelial keratitis; PEE punctate epithelial erosions; DES dry eye syndrome; MGD meibomian gland dysfunction; ATs artificial tears; PFAT's preservative free artificial tears; [place] nuclear sclerotic cataract; PSC posterior subcapsular cataract; ERM epi-retinal membrane; PVD posterior vitreous detachment; RD retinal detachment; DM diabetes mellitus; DR diabetic retinopathy; NPDR non-proliferative diabetic retinopathy; PDR proliferative diabetic retinopathy; CSME clinically significant macular edema; DME diabetic macular edema; dbh dot blot hemorrhages; CWS cotton wool spot; POAG primary open angle glaucoma; C/D cup-to-disc ratio; HVF humphrey visual field; GVF goldmann visual field; OCT optical coherence tomography; IOP intraocular pressure; BRVO Branch retinal vein occlusion; CRVO central retinal vein occlusion; CRAO central retinal artery occlusion; BRAO branch retinal artery occlusion; RT retinal tear; SB scleral buckle; PPV pars plana vitrectomy; VH Vitreous hemorrhage; PRP panretinal laser photocoagulation; IVK intravitreal kenalog; VMT vitreomacular traction; MH Macular hole;  NVD neovascularization of the disc; NVE  neovascularization elsewhere; AREDS age related eye disease study; ARMD age related macular degeneration; POAG primary open angle glaucoma; EBMD epithelial/anterior basement membrane dystrophy; ACIOL anterior chamber intraocular lens; IOL intraocular lens; PCIOL posterior chamber intraocular lens; Phaco/IOL phacoemulsification with intraocular lens placement; [place] photorefractive keratectomy; LASIK laser assisted in situ keratomileusis; HTN hypertension; DM diabetes mellitus; COPD chronic obstructive pulmonary disease

## 2022-01-05 ENCOUNTER — Encounter (INDEPENDENT_AMBULATORY_CARE_PROVIDER_SITE_OTHER): Payer: Self-pay | Admitting: Ophthalmology

## 2022-01-11 ENCOUNTER — Encounter: Payer: Self-pay | Admitting: Nurse Practitioner

## 2022-01-30 MED ORDER — DEXCOM G6 SENSOR MISC: 3 refills | Status: DC

## 2022-01-30 NOTE — Telephone Encounter (Signed)
Can you call the pharmacy and find out why they wont refill his sensors?  May have to suggest he reach out to [facility] to see if they'll send him some to hold him over (especially if he tells them they had to be removed prematurely for diagnostic tests).

## 2022-02-08 NOTE — Progress Notes (Shared)
[facility] Note  02/15/2022    CHIEF COMPLAINT Patient presents for No chief complaint on file.   HISTORY OF PRESENT ILLNESS: [name] is a 57 y.o. male who presents to the clinic today for:     Referring physician: Vivi Barrack, MD [place],  [place] [address]  HISTORICAL INFORMATION:   Selected notes from the MEDICAL RECORD NUMBER Referred by Dr. Sherlene Shams for DM exam ([facility] [place]) LEE:  Ocular Hx- PMH-    CURRENT MEDICATIONS: No current outpatient medications on file. (Ophthalmic Drugs)   No current facility-administered medications for this visit. (Ophthalmic Drugs)   Current Outpatient Medications (Other)  Medication Sig   Blood Pressure KIT Use daily as needed to check blood pressure   Continuous Blood Gluc Sensor (DEXCOM G6 SENSOR) MISC Change sensor every 10 days as directed   Continuous Blood Gluc Transmit (DEXCOM G6 TRANSMITTER) MISC Change transmitter every 90 days as directed.   esomeprazole (NEXIUM) 40 MG capsule Take 1 capsule (40 mg total) by mouth daily at 12 noon.   ferrous sulfate 325 (65 FE) MG tablet Take 650 mg by mouth daily.   gabapentin (NEURONTIN) 300 MG capsule Take 300 mg by mouth 3 (three) times daily.   Insulin Disposable Pump (OMNIPOD 5 G6 INTRO, GEN 5,) KIT Change pod every 48-72 hrs   Insulin Disposable Pump (OMNIPOD 5 G6 POD, GEN 5,) MISC Change pod every 48-72 hrs   insulin lispro (HUMALOG) 100 UNIT/ML injection Use with Omnipod for TDD around 45 units per day   irbesartan (AVAPRO) 300 MG tablet Take 1 tablet (300 mg total) by mouth daily.   oxyCODONE (OXY IR/ROXICODONE) 5 MG immediate release tablet Take 1 tablet (5 mg total) by mouth every 4 (four) hours as needed for severe pain. (Patient not taking: Reported on 12/22/2021)   sildenafil (REVATIO) 20 MG tablet Take 1-5 tablets (20-100 mg total) by mouth daily as needed (erectile dysfunction).   No current facility-administered  medications for this visit. (Other)   REVIEW OF SYSTEMS:   ALLERGIES Allergies  Allergen Reactions   Crestor [Rosuvastatin Calcium] Other (See Comments)    Muscle aches   Iron Other (See Comments)    "Tore up my stomach"  "Liquid Iron"   Lipitor [Atorvastatin Calcium] Other (See Comments)    Muscles aches   Statins Other (See Comments)    Muscle aches    PAST MEDICAL HISTORY Past Medical History:  Diagnosis Date   Allergy    Anemia    Diabetes mellitus without complication ([place])    type 1 per patient   GERD (gastroesophageal reflux disease)    HLD (hyperlipidemia)    Hypertension    Past Surgical History:  Procedure Laterality Date   ANKLE SURGERY Right    LUMBAR LAMINECTOMY/DECOMPRESSION MICRODISCECTOMY N/A 10/07/2021   Procedure: Microlumbar decompression L4-5, possible L5-S1;  Surgeon: Susa Day, MD;  Location: [facility];  Service: Orthopedics;  Laterality: N/A;   UPPER GASTROINTESTINAL ENDOSCOPY     VASECTOMY  1994   FAMILY HISTORY Family History  Problem Relation Age of Onset   COPD Mother    Diabetes Mother    Early death Father 35       MI   Heart attack Father    Lymphoma Sister 75   Hypertension Brother    Diabetes Maternal Grandmother    Stroke Neg Hx    Alcohol abuse Neg Hx    Drug abuse Neg  Hx    Hyperlipidemia Neg Hx    Kidney disease Neg Hx    Colon cancer Neg Hx    Esophageal cancer Neg Hx    Stomach cancer Neg Hx    Rectal cancer Neg Hx    SOCIAL HISTORY Social History   Tobacco Use   Smoking status: Every Day    Packs/day: 1.00    Years: 30.00    Total pack years: 30.00    Types: Cigarettes   Smokeless tobacco: Never  Vaping Use   Vaping Use: Never used  Substance Use Topics   Alcohol use: Not Currently    Alcohol/week: 0.0 - 8.0 standard drinks of alcohol   Drug use: No       OPHTHALMIC EXAM:  Not recorded    IMAGING AND PROCEDURES  Imaging and Procedures for 02/15/2022          ASSESSMENT/PLAN:  No diagnosis  found.  Moderate nonproliferative diabetic retinopathy w/o DME, both eyes - The incidence, risk factors for progression, natural history and treatment options for diabetic retinopathy were discussed with patient.   - The need for close monitoring of blood glucose, blood pressure, and serum lipids, avoiding cigarette or any type of tobacco, and the need for long term follow up was also discussed with patient. - exam shows scattered MA / DBH greatest about the optic disc OU - FA (12.20.23) shows scattered leaking MA OU, no NV OU - OCT without diabetic macular edema, both eyes  - f/u in 6 weeks -- DFE/OCT, review FA  2,3. Hypertensive retinopathy OU - discussed importance of tight BP control - monitor  4. Mixed Cataract OU - The symptoms of cataract, surgical options, and treatments and risks were discussed with patient. - discussed diagnosis and progression - not yet visually significant - monitor for now  Ophthalmic Meds Ordered this visit:  No orders of the defined types were placed in this encounter.    No follow-ups on file.  There are no Patient Instructions on file for this visit.   Explained the diagnoses, plan, and follow up with the patient and they expressed understanding.  Patient expressed understanding of the importance of proper follow up care.   This document serves as a record of services personally performed by Gardiner Sleeper, MD, PhD. It was created on their behalf by Orvan Falconer, an ophthalmic technician. The creation of this record is the provider's dictation and/or activities during the visit.    Electronically signed by: Orvan Falconer, OA, 02/08/22  2:46 PM   Gardiner Sleeper, M.D., Ph.D. Diseases & Surgery of the Retina and Vitreous [facility]: M myopia (nearsighted); A astigmatism; H hyperopia (farsighted); P presbyopia; Mrx spectacle prescription;  CTL contact lenses; OD right eye; OS left eye; OU both  eyes  XT exotropia; ET esotropia; PEK punctate epithelial keratitis; PEE punctate epithelial erosions; DES dry eye syndrome; MGD meibomian gland dysfunction; ATs artificial tears; PFAT's preservative free artificial tears; [place] nuclear sclerotic cataract; PSC posterior subcapsular cataract; ERM epi-retinal membrane; PVD posterior vitreous detachment; RD retinal detachment; DM diabetes mellitus; DR diabetic retinopathy; NPDR non-proliferative diabetic retinopathy; PDR proliferative diabetic retinopathy; CSME clinically significant macular edema; DME diabetic macular edema; dbh dot blot hemorrhages; CWS cotton wool spot; POAG primary open angle glaucoma; C/D cup-to-disc ratio; HVF humphrey visual field; GVF goldmann visual field; OCT optical coherence tomography; IOP intraocular pressure; BRVO Branch retinal vein occlusion; CRVO central retinal vein occlusion; CRAO central retinal artery  occlusion; BRAO branch retinal artery occlusion; RT retinal tear; SB scleral buckle; PPV pars plana vitrectomy; VH Vitreous hemorrhage; PRP panretinal laser photocoagulation; IVK intravitreal kenalog; VMT vitreomacular traction; MH Macular hole;  NVD neovascularization of the disc; NVE neovascularization elsewhere; AREDS age related eye disease study; ARMD age related macular degeneration; POAG primary open angle glaucoma; EBMD epithelial/anterior basement membrane dystrophy; ACIOL anterior chamber intraocular lens; IOL intraocular lens; PCIOL posterior chamber intraocular lens; Phaco/IOL phacoemulsification with intraocular lens placement; [place] photorefractive keratectomy; LASIK laser assisted in situ keratomileusis; HTN hypertension; DM diabetes mellitus; COPD chronic obstructive pulmonary disease

## 2022-02-09 NOTE — Telephone Encounter (Signed)
Pharmacy has been called. Have held on the line for 15 minutes, message states that thy are assisting other customers. I will call back.

## 2022-02-15 ENCOUNTER — Encounter (INDEPENDENT_AMBULATORY_CARE_PROVIDER_SITE_OTHER): Payer: Self-pay

## 2022-02-15 ENCOUNTER — Encounter (INDEPENDENT_AMBULATORY_CARE_PROVIDER_SITE_OTHER): Payer: Commercial Managed Care - PPO | Admitting: Ophthalmology

## 2022-02-15 DIAGNOSIS — H35033 Hypertensive retinopathy, bilateral: Secondary | ICD-10-CM

## 2022-02-15 DIAGNOSIS — E113393 Type 2 diabetes mellitus with moderate nonproliferative diabetic retinopathy without macular edema, bilateral: Secondary | ICD-10-CM

## 2022-02-15 DIAGNOSIS — H25813 Combined forms of age-related cataract, bilateral: Secondary | ICD-10-CM

## 2022-02-15 DIAGNOSIS — I10 Essential (primary) hypertension: Secondary | ICD-10-CM

## 2022-02-21 ENCOUNTER — Encounter: Payer: Self-pay | Admitting: Internal Medicine

## 2022-02-21 ENCOUNTER — Ambulatory Visit (INDEPENDENT_AMBULATORY_CARE_PROVIDER_SITE_OTHER): Payer: Commercial Managed Care - PPO | Admitting: Internal Medicine

## 2022-02-21 VITALS — BP 160/92 | HR 83 | Ht 70.0 in | Wt 177.4 lb

## 2022-02-21 DIAGNOSIS — E1069 Type 1 diabetes mellitus with other specified complication: Secondary | ICD-10-CM | POA: Diagnosis not present

## 2022-02-21 DIAGNOSIS — K219 Gastro-esophageal reflux disease without esophagitis: Secondary | ICD-10-CM | POA: Diagnosis not present

## 2022-02-21 DIAGNOSIS — E1065 Type 1 diabetes mellitus with hyperglycemia: Secondary | ICD-10-CM

## 2022-02-21 DIAGNOSIS — Z720 Tobacco use: Secondary | ICD-10-CM

## 2022-02-21 DIAGNOSIS — D500 Iron deficiency anemia secondary to blood loss (chronic): Secondary | ICD-10-CM

## 2022-02-21 DIAGNOSIS — N529 Male erectile dysfunction, unspecified: Secondary | ICD-10-CM

## 2022-02-21 DIAGNOSIS — M5126 Other intervertebral disc displacement, lumbar region: Secondary | ICD-10-CM

## 2022-02-21 DIAGNOSIS — E559 Vitamin D deficiency, unspecified: Secondary | ICD-10-CM

## 2022-02-21 DIAGNOSIS — E785 Hyperlipidemia, unspecified: Secondary | ICD-10-CM

## 2022-02-21 DIAGNOSIS — F17200 Nicotine dependence, unspecified, uncomplicated: Secondary | ICD-10-CM | POA: Diagnosis not present

## 2022-02-21 DIAGNOSIS — I152 Hypertension secondary to endocrine disorders: Secondary | ICD-10-CM

## 2022-02-21 DIAGNOSIS — E1159 Type 2 diabetes mellitus with other circulatory complications: Secondary | ICD-10-CM

## 2022-02-21 DIAGNOSIS — Z0001 Encounter for general adult medical examination with abnormal findings: Secondary | ICD-10-CM

## 2022-02-21 MED ORDER — AMLODIPINE BESYLATE 5 MG PO TABS: 5.0000 mg | ORAL_TABLET | Freq: Every day | ORAL | 2 refills | Status: DC

## 2022-02-21 NOTE — Progress Notes (Unsigned)
New Patient Office Visit  Subjective    Patient ID: [name], male    DOB: Nov 26, 1965  Age: 57 y.o. MRN: [id number]  CC:  Chief Complaint  Patient presents with   Establish Care   HPI Golden Bradley presents to establish care.  He is a 57 year old male with a past medical history significant for hypertension, type 1 diabetes mellitus, GERD, iron deficiency, chronic back pain, hyperlipidemia.  He was previously followed at [facility] primary care by Dr. [name].  Mr. [name] acute concern today is his blood pressure, which is significantly elevated.  He states that he took irbesartan less than an hour prior to his appointment.  He endorses chronic lumbar back and right leg pain but is otherwise asymptomatic.  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  He smokes 1 pack/day of cigarettes and has been smoking for roughly 30 years.  He drinks alcohol occasionally and denies illicit drug use.  Acute concerns, chronic medical conditions, and outstanding preventative care items discussed today are individually addressed in A/P below.  Outpatient Encounter Medications as of 02/21/2022  Medication Sig   amLODipine (NORVASC) 5 MG tablet Take 1 tablet (5 mg total) by mouth daily.   Blood Pressure KIT Use daily as needed to check blood pressure   Continuous Blood Gluc Sensor (DEXCOM G6 SENSOR) MISC Change sensor every 10 days as directed   Continuous Blood Gluc Transmit (DEXCOM G6 TRANSMITTER) MISC Change transmitter every 90 days as directed.   esomeprazole (NEXIUM) 40 MG capsule Take 1 capsule (40 mg total) by mouth daily at 12 noon.   ferrous sulfate 325 (65 FE) MG tablet Take 650 mg by mouth daily.   gabapentin (NEURONTIN) 300 MG capsule Take 300 mg by mouth 3 (three) times daily.   Insulin Disposable Pump (OMNIPOD 5 G6 INTRO, GEN 5,) KIT Change pod every 48-72 hrs   Insulin Disposable Pump (OMNIPOD 5 G6 POD, GEN 5,) MISC Change pod every 48-72 hrs   insulin lispro (HUMALOG) 100  UNIT/ML injection Use with Omnipod for TDD around 45 units per day   irbesartan (AVAPRO) 300 MG tablet Take 1 tablet (300 mg total) by mouth daily.   [DISCONTINUED] oxyCODONE (OXY IR/ROXICODONE) 5 MG immediate release tablet Take 1 tablet (5 mg total) by mouth every 4 (four) hours as needed for severe pain. (Patient not taking: Reported on 12/22/2021)   [DISCONTINUED] sildenafil (REVATIO) 20 MG tablet Take 1-5 tablets (20-100 mg total) by mouth daily as needed (erectile dysfunction).   No facility-administered encounter medications on file as of 02/21/2022.   Past Medical History:  Diagnosis Date   Allergy    Anemia    Diabetes mellitus without complication ([place])    type 1 per patient   GERD (gastroesophageal reflux disease)    HLD (hyperlipidemia)    Hypertension    Past Surgical History:  Procedure Laterality Date   ANKLE SURGERY Right    LUMBAR LAMINECTOMY/DECOMPRESSION MICRODISCECTOMY N/A 10/07/2021   Procedure: Microlumbar decompression L4-5, possible L5-S1;  Surgeon: Susa Day, MD;  Location: [place];  Service: Orthopedics;  Laterality: N/A;   UPPER GASTROINTESTINAL ENDOSCOPY     VASECTOMY  1994   Family History  Problem Relation Age of Onset   COPD Mother    Diabetes Mother    Early death Father 35       MI   Heart attack Father    Lymphoma Sister 81   Hypertension Brother    Diabetes Maternal Grandmother  Stroke Neg Hx    Alcohol abuse Neg Hx    Drug abuse Neg Hx    Hyperlipidemia Neg Hx    Kidney disease Neg Hx    Colon cancer Neg Hx    Esophageal cancer Neg Hx    Stomach cancer Neg Hx    Rectal cancer Neg Hx    Social History   Socioeconomic History   Marital status: Married    Spouse name: Not on file   Number of children: 1   Years of education: Not on file   Highest education level: Not on file  Occupational History   Occupation: Architect  Tobacco Use   Smoking status: Every Day    Packs/day: 1.00    Years: 30.00    Total pack years: 30.00     Types: Cigarettes   Smokeless tobacco: Never  Vaping Use   Vaping Use: Never used  Substance and Sexual Activity   Alcohol use: Not Currently    Alcohol/week: 0.0 - 8.0 standard drinks of alcohol   Drug use: No   Sexual activity: Yes  Other Topics Concern   Not on file  Social History Narrative   Not on file   Social Determinants of Health   Financial Resource Strain: Not on file  Food Insecurity: Not on file  Transportation Needs: Not on file  Physical Activity: Not on file  Stress: Not on file  Social Connections: Not on file  Intimate Partner Violence: Not on file    Review of Systems  Musculoskeletal:  Positive for back pain (Chronic lumbar back pain).  All other systems reviewed and are negative.  Objective    BP (!) 160/92   Pulse 83   Ht '5\' 10"'$  (1.778 m)   Wt 177 lb 6.4 oz (80.5 kg)   SpO2 98%   BMI 25.45 kg/m   Physical Exam Vitals reviewed.  Constitutional:      General: He is not in acute distress.    Appearance: Normal appearance. He is not ill-appearing.  HENT:     Head: Normocephalic and atraumatic.     Right Ear: External ear normal.     Left Ear: External ear normal.     Nose: Nose normal. No congestion or rhinorrhea.     Mouth/Throat:     Mouth: Mucous membranes are moist.     Pharynx: Oropharynx is clear.  Eyes:     General: No scleral icterus.    Extraocular Movements: Extraocular movements intact.     Conjunctiva/sclera: Conjunctivae normal.     Pupils: Pupils are equal, round, and reactive to light.  Cardiovascular:     Rate and Rhythm: Normal rate and regular rhythm.     Pulses: Normal pulses.     Heart sounds: Normal heart sounds. No murmur heard. Pulmonary:     Effort: Pulmonary effort is normal.     Breath sounds: Normal breath sounds. No wheezing, rhonchi or rales.  Abdominal:     General: Abdomen is flat. Bowel sounds are normal. There is no distension.     Palpations: Abdomen is soft.     Tenderness: There is no  abdominal tenderness.  Musculoskeletal:        General: No swelling or deformity. Normal range of motion.     Cervical back: Normal range of motion.  Skin:    General: Skin is warm and dry.     Capillary Refill: Capillary refill takes less than 2 seconds.  Neurological:     General: No  focal deficit present.     Mental Status: He is alert and oriented to person, place, and time.     Motor: No weakness.  Psychiatric:        Mood and Affect: Mood normal.        Behavior: Behavior normal.        Thought Content: Thought content normal.   Last CBC Lab Results  Component Value Date   WBC 8.2 02/21/2022   HGB 13.7 02/21/2022   HCT 40.2 02/21/2022   MCV 96 02/21/2022   MCH 32.6 02/21/2022   RDW 11.9 02/21/2022   PLT 361 41/32/4401   Last metabolic panel Lab Results  Component Value Date   GLUCOSE 228 (H) 02/21/2022   NA 135 02/21/2022   K 5.6 (H) 02/21/2022   CL 97 02/21/2022   CO2 24 02/21/2022   BUN 12 02/21/2022   CREATININE 0.93 02/21/2022   GFRNONAA >60 10/08/2021   CALCIUM 9.3 02/21/2022   PROT 6.4 02/21/2022   ALBUMIN 3.8 02/21/2022   LABGLOB 2.6 02/21/2022   AGRATIO 1.5 02/21/2022   BILITOT <0.2 02/21/2022   ALKPHOS 130 (H) 02/21/2022   AST 25 02/21/2022   ALT 21 02/21/2022   ANIONGAP 8 10/08/2021   Last lipids Lab Results  Component Value Date   CHOL 230 (H) 02/21/2022   HDL 103 02/21/2022   LDLCALC 117 (H) 02/21/2022   LDLDIRECT 156.0 01/18/2016   TRIG 61 02/21/2022   CHOLHDL 2.2 02/21/2022   Last hemoglobin A1c Lab Results  Component Value Date   HGBA1C 7.4 (A) 12/22/2021   Last thyroid functions Lab Results  Component Value Date   TSH 2.010 02/21/2022   Last vitamin D Lab Results  Component Value Date   VD25OH 24.9 (L) 02/21/2022   Last vitamin B12 and Folate Lab Results  Component Value Date   VITAMINB12 568 02/21/2022   FOLATE >20.0 02/21/2022    Assessment & Plan:   Problem List Items Addressed This Visit       Hypertension  associated with diabetes ([place]) (Chronic)    He is currently prescribed irbesartan 300 mg daily for treatment of hypertension.  His blood pressure is significantly elevated today, 183/107 initially and 182/102 on repeat.  He states that he took irbesartan less than an hour prior to his appointment. -Add amlodipine 5 mg daily given severe HTN -Follow-up in 4 weeks for BP check      Gastroesophageal reflux disease without esophagitis    Symptoms well-controlled with Nexium.  No changes today.      Diabetes mellitus type 1 ([place])    Followed by endocrinology.  Last A1c 7.4 in December.  He has an OmniPod and Dexcom. -Endocrinology follow-up scheduled for March      Hyperlipidemia due to T1DM [facility]) - Statin Intolerant    History of statin intolerance.  Not currently on any cholesterol-lowering therapy.  Lipid panel last updated in October 2022.  Total cholesterol 241 and LDL 135 at that time. -Repeat lipid panel ordered today -Consider Repatha at follow-up      HNP (herniated nucleus pulposus), lumbar    He endorses chronic lower back and right leg pain.  He is followed by Dr. Tonita Cong with [facility].  Underwent surgery in September 2023.  Currently prescribed gabapentin 300 mg 3 times daily.      Iron deficiency anemia due to chronic blood loss    Currently on iron supplementation.  Repeat iron studies and CBC ordered today.  Erectile dysfunction    Currently prescribed Revatio.  States that he has not taken the medication in a while.  No changes today.      Tobacco use    Currently smokes 1 pack of cigarettes daily and has accumulated at least a 30-pack-year history.  He is precontemplative with regards to cessation, but is aware of the need to quit. -LDCT ordered today      Encounter for general adult medical examination with abnormal findings    Presenting today to establish care.  Previous records and labs been reviewed. -Repeat labs ordered today -LDCT ordered today -Last  colonoscopy completed in March 2018.  Recommendations called for repeat colonoscopy in 5 years.  This was completed by [facility].  He was provided with the contact information to call and schedule follow-up appointment today -Follow-up in 4 weeks for HTN       Return in about 4 weeks (around 03/21/2022) for HTN, review labs.   Johnette Abraham, MD

## 2022-02-21 NOTE — Patient Instructions (Signed)
It was a pleasure to see you today.  Thank you for giving [place] the opportunity to be involved in your care.  Below is a brief recap of your visit and next steps.  We will plan to see you again in 4 weeks.  Summary You have established care today We will check labs Start amlodipine 5 mg daily for hypertension I have ordered a CT scan for lung cancer screening I recommend contacting your GI office to schedule a repeat colonoscopy We will plan for follow up in 4 weeks

## 2022-02-22 ENCOUNTER — Encounter: Payer: Self-pay | Admitting: Internal Medicine

## 2022-02-22 LAB — CBC WITH DIFFERENTIAL/PLATELET
Basophils Absolute: 0.1 10*3/uL (ref 0.0–0.2)
Basos: 1 %
EOS (ABSOLUTE): 0.2 10*3/uL (ref 0.0–0.4)
Eos: 2 %
Hematocrit: 40.2 % (ref 37.5–51.0)
Hemoglobin: 13.7 g/dL (ref 13.0–17.7)
Immature Grans (Abs): 0 10*3/uL (ref 0.0–0.1)
Immature Granulocytes: 1 %
Lymphocytes Absolute: 1 10*3/uL (ref 0.7–3.1)
Lymphs: 12 %
MCH: 32.6 pg (ref 26.6–33.0)
MCHC: 34.1 g/dL (ref 31.5–35.7)
MCV: 96 fL (ref 79–97)
Monocytes Absolute: 0.6 10*3/uL (ref 0.1–0.9)
Monocytes: 7 %
Neutrophils Absolute: 6.4 10*3/uL (ref 1.4–7.0)
Neutrophils: 77 %
Platelets: 361 10*3/uL (ref 150–450)
RBC: 4.2 x10E6/uL (ref 4.14–5.80)
RDW: 11.9 % (ref 11.6–15.4)
WBC: 8.2 10*3/uL (ref 3.4–10.8)

## 2022-02-22 LAB — LIPID PANEL
Chol/HDL Ratio: 2.2 ratio (ref 0.0–5.0)
Cholesterol, Total: 230 mg/dL — ABNORMAL HIGH (ref 100–199)
HDL: 103 mg/dL (ref >39)
LDL Chol Calc (NIH): 117 mg/dL — ABNORMAL HIGH (ref 0–99)
Triglycerides: 61 mg/dL (ref 0–149)
VLDL Cholesterol Cal: 10 mg/dL (ref 5–40)

## 2022-02-22 LAB — CMP14+EGFR
ALT: 21 IU/L (ref 0–44)
AST: 25 IU/L (ref 0–40)
Albumin/Globulin Ratio: 1.5 (ref 1.2–2.2)
Albumin: 3.8 g/dL (ref 3.8–4.9)
Alkaline Phosphatase: 130 IU/L — ABNORMAL HIGH (ref 44–121)
BUN/Creatinine Ratio: 13 (ref 9–20)
BUN: 12 mg/dL (ref 6–24)
Bilirubin Total: <0.2 mg/dL (ref 0.0–1.2)
CO2: 24 mmol/L (ref 20–29)
Calcium: 9.3 mg/dL (ref 8.7–10.2)
Chloride: 97 mmol/L (ref 96–106)
Creatinine, Ser: 0.93 mg/dL (ref 0.76–1.27)
Globulin, Total: 2.6 g/dL (ref 1.5–4.5)
Glucose: 228 mg/dL — ABNORMAL HIGH (ref 70–99)
Potassium: 5.6 mmol/L — ABNORMAL HIGH (ref 3.5–5.2)
Sodium: 135 mmol/L (ref 134–144)
Total Protein: 6.4 g/dL (ref 6.0–8.5)
eGFR: 97 mL/min/{1.73_m2} (ref >59)

## 2022-02-22 LAB — B12 AND FOLATE PANEL
Folate: >20 ng/mL (ref >3.0)
Vitamin B-12: 568 pg/mL (ref 232–1245)

## 2022-02-22 LAB — IRON,TIBC AND FERRITIN PANEL
Ferritin: 85 ng/mL (ref 30–400)
Iron Saturation: 54 % (ref 15–55)
Iron: 165 ug/dL (ref 38–169)
Total Iron Binding Capacity: 308 ug/dL (ref 250–450)
UIBC: 143 ug/dL (ref 111–343)

## 2022-02-22 LAB — VITAMIN D 25 HYDROXY (VIT D DEFICIENCY, FRACTURES): Vit D, 25-Hydroxy: 24.9 ng/mL — ABNORMAL LOW (ref 30.0–100.0)

## 2022-02-22 LAB — TSH+FREE T4
Free T4: 1.03 ng/dL (ref 0.82–1.77)
TSH: 2.01 u[IU]/mL (ref 0.450–4.500)

## 2022-02-23 DIAGNOSIS — Z720 Tobacco use: Secondary | ICD-10-CM | POA: Insufficient documentation

## 2022-02-23 DIAGNOSIS — Z0001 Encounter for general adult medical examination with abnormal findings: Secondary | ICD-10-CM | POA: Insufficient documentation

## 2022-02-23 NOTE — Assessment & Plan Note (Signed)
Followed by endocrinology.  Last A1c 7.4 in December.  He has an OmniPod and Dexcom. -Endocrinology follow-up scheduled for March

## 2022-02-23 NOTE — Assessment & Plan Note (Signed)
He is currently prescribed irbesartan 300 mg daily for treatment of hypertension.  His blood pressure is significantly elevated today, 183/107 initially and 182/102 on repeat.  He states that he took irbesartan less than an hour prior to his appointment. -Add amlodipine 5 mg daily given severe HTN -Follow-up in 4 weeks for BP check

## 2022-02-23 NOTE — Assessment & Plan Note (Signed)
Presenting today to establish care.  Previous records and labs been reviewed. -Repeat labs ordered today -LDCT ordered today -Last colonoscopy completed in March 2018.  Recommendations called for repeat colonoscopy in 5 years.  This was completed by [facility].  He was provided with the contact information to call and schedule follow-up appointment today -Follow-up in 4 weeks for HTN

## 2022-02-23 NOTE — Assessment & Plan Note (Signed)
Symptoms well-controlled with Nexium.  No changes today.

## 2022-02-23 NOTE — Assessment & Plan Note (Signed)
History of statin intolerance.  Not currently on any cholesterol-lowering therapy.  Lipid panel last updated in October 2022.  Total cholesterol 241 and LDL 135 at that time. -Repeat lipid panel ordered today -Consider Repatha at follow-up

## 2022-02-23 NOTE — Assessment & Plan Note (Signed)
Currently prescribed Revatio.  States that he has not taken the medication in a while.  No changes today.

## 2022-02-23 NOTE — Assessment & Plan Note (Signed)
Currently on iron supplementation.  Repeat iron studies and CBC ordered today.

## 2022-02-23 NOTE — Assessment & Plan Note (Signed)
Currently smokes 1 pack of cigarettes daily and has accumulated at least a 30-pack-year history.  He is precontemplative with regards to cessation, but is aware of the need to quit. -LDCT ordered today

## 2022-02-23 NOTE — Assessment & Plan Note (Signed)
He endorses chronic lower back and right leg pain.  He is followed by Dr. Tonita Cong with [facility].  Underwent surgery in September 2023.  Currently prescribed gabapentin 300 mg 3 times daily.

## 2022-02-28 ENCOUNTER — Ambulatory Visit: Payer: Commercial Managed Care - PPO | Admitting: Internal Medicine

## 2022-03-21 ENCOUNTER — Ambulatory Visit (INDEPENDENT_AMBULATORY_CARE_PROVIDER_SITE_OTHER): Payer: Commercial Managed Care - PPO | Admitting: Internal Medicine

## 2022-03-21 ENCOUNTER — Encounter: Payer: Self-pay | Admitting: Internal Medicine

## 2022-03-21 VITALS — BP 146/88 | HR 78 | Ht 70.0 in | Wt 179.4 lb

## 2022-03-21 DIAGNOSIS — Z720 Tobacco use: Secondary | ICD-10-CM

## 2022-03-21 DIAGNOSIS — E1159 Type 2 diabetes mellitus with other circulatory complications: Secondary | ICD-10-CM | POA: Diagnosis not present

## 2022-03-21 DIAGNOSIS — I152 Hypertension secondary to endocrine disorders: Secondary | ICD-10-CM | POA: Diagnosis not present

## 2022-03-21 DIAGNOSIS — R748 Abnormal levels of other serum enzymes: Secondary | ICD-10-CM | POA: Diagnosis not present

## 2022-03-21 DIAGNOSIS — Z789 Other specified health status: Secondary | ICD-10-CM

## 2022-03-21 DIAGNOSIS — E785 Hyperlipidemia, unspecified: Secondary | ICD-10-CM

## 2022-03-21 DIAGNOSIS — E559 Vitamin D deficiency, unspecified: Secondary | ICD-10-CM

## 2022-03-21 DIAGNOSIS — E875 Hyperkalemia: Secondary | ICD-10-CM | POA: Insufficient documentation

## 2022-03-21 MED ORDER — REPATHA 140 MG/ML ~~LOC~~ SOSY: 140.0000 mg | PREFILLED_SYRINGE | SUBCUTANEOUS | 3 refills | Status: DC

## 2022-03-21 MED ORDER — BLOOD PRESSURE KIT: PACK | 0 refills | Status: DC

## 2022-03-21 MED ORDER — AMLODIPINE BESYLATE 10 MG PO TABS: 10.0000 mg | ORAL_TABLET | Freq: Every day | ORAL | 1 refills | Status: DC

## 2022-03-21 NOTE — Assessment & Plan Note (Signed)
ALP 130 on labs from last month. -Repeat CMP and GGT ordered

## 2022-03-21 NOTE — Progress Notes (Signed)
Established Patient Office Visit  Subjective   Patient ID: [name], male    DOB: 14-May-1965  Age: 57 y.o. MRN: [id number]  Chief Complaint  Patient presents with   Hypertension    Follow up   [name] returns to care today for HTN follow-up and lab review.  He was last seen by me as a new patient presenting to establish care on 2/6.  His blood pressure was significantly elevated at that time and amlodipine 5 mg daily was added to his antihypertensive regimen.  Baseline labs were checked and 4-week follow-up was arranged.  There have been no acute interval events.  [name] reports feeling well today.  He is asymptomatic and has no additional concerns to discuss today.  Past Medical History:  Diagnosis Date   Allergy    Anemia    Diabetes mellitus without complication ([place])    type 1 per patient   GERD (gastroesophageal reflux disease)    HLD (hyperlipidemia)    Hypertension    Past Surgical History:  Procedure Laterality Date   ANKLE SURGERY Right    LUMBAR LAMINECTOMY/DECOMPRESSION MICRODISCECTOMY N/A 10/07/2021   Procedure: Microlumbar decompression L4-5, possible L5-S1;  Surgeon: Susa Day, MD;  Location: [place];  Service: Orthopedics;  Laterality: N/A;   UPPER GASTROINTESTINAL ENDOSCOPY     VASECTOMY  1994   Social History   Tobacco Use   Smoking status: Every Day    Packs/day: 1.00    Years: 30.00    Total pack years: 30.00    Types: Cigarettes   Smokeless tobacco: Never  Vaping Use   Vaping Use: Never used  Substance Use Topics   Alcohol use: Not Currently    Alcohol/week: 0.0 - 8.0 standard drinks of alcohol   Drug use: No   Family History  Problem Relation Age of Onset   COPD Mother    Diabetes Mother    Early death Father 18       MI   Heart attack Father    Lymphoma Sister 33   Hypertension Brother    Diabetes Maternal Grandmother    Stroke Neg Hx    Alcohol abuse Neg Hx    Drug abuse Neg Hx    Hyperlipidemia Neg Hx    Kidney disease  Neg Hx    Colon cancer Neg Hx    Esophageal cancer Neg Hx    Stomach cancer Neg Hx    Rectal cancer Neg Hx    Allergies  Allergen Reactions   Crestor [Rosuvastatin Calcium] Other (See Comments)    Muscle aches   Iron Other (See Comments)    "Tore up my stomach"  "Liquid Iron"   Lipitor [Atorvastatin Calcium] Other (See Comments)    Muscles aches   Statins Other (See Comments)    Muscle aches   Review of Systems  Constitutional:  Negative for chills and fever.  HENT:  Negative for sore throat.   Respiratory:  Negative for cough and shortness of breath.   Cardiovascular:  Negative for chest pain, palpitations and leg swelling.  Gastrointestinal:  Negative for abdominal pain, blood in stool, constipation, diarrhea, nausea and vomiting.  Genitourinary:  Negative for dysuria and hematuria.  Musculoskeletal:  Negative for myalgias.  Skin:  Negative for itching and rash.  Neurological:  Negative for dizziness and headaches.  Psychiatric/Behavioral:  Negative for depression and suicidal ideas.      Objective:     BP (!) 146/88   Pulse 78  Ht '5\' 10"'$  (1.778 m)   Wt 179 lb 6.4 oz (81.4 kg)   SpO2 96%   BMI 25.74 kg/m  BP Readings from Last 3 Encounters:  03/21/22 (!) 146/88  02/21/22 (!) 160/92  12/22/21 (!) 160/88   Physical Exam Vitals reviewed.  Constitutional:      General: He is not in acute distress.    Appearance: Normal appearance. He is not ill-appearing.  HENT:     Head: Normocephalic and atraumatic.     Right Ear: External ear normal.     Left Ear: External ear normal.     Nose: Nose normal. No congestion or rhinorrhea.     Mouth/Throat:     Mouth: Mucous membranes are moist.     Pharynx: Oropharynx is clear.  Eyes:     General: No scleral icterus.    Extraocular Movements: Extraocular movements intact.     Conjunctiva/sclera: Conjunctivae normal.     Pupils: Pupils are equal, round, and reactive to light.  Cardiovascular:     Rate and Rhythm: Normal  rate and regular rhythm.     Pulses: Normal pulses.     Heart sounds: Normal heart sounds. No murmur heard. Pulmonary:     Effort: Pulmonary effort is normal.     Breath sounds: Normal breath sounds. No wheezing, rhonchi or rales.  Abdominal:     General: Abdomen is flat. Bowel sounds are normal. There is no distension.     Palpations: Abdomen is soft.     Tenderness: There is no abdominal tenderness.  Musculoskeletal:        General: No swelling or deformity. Normal range of motion.     Cervical back: Normal range of motion.  Skin:    General: Skin is warm and dry.     Capillary Refill: Capillary refill takes less than 2 seconds.  Neurological:     General: No focal deficit present.     Mental Status: He is alert and oriented to person, place, and time.     Motor: No weakness.  Psychiatric:        Mood and Affect: Mood normal.        Behavior: Behavior normal.        Thought Content: Thought content normal.   Last CBC Lab Results  Component Value Date   WBC 8.2 02/21/2022   HGB 13.7 02/21/2022   HCT 40.2 02/21/2022   MCV 96 02/21/2022   MCH 32.6 02/21/2022   RDW 11.9 02/21/2022   PLT 361 0000000   Last metabolic panel Lab Results  Component Value Date   GLUCOSE 228 (H) 02/21/2022   NA 135 02/21/2022   K 5.6 (H) 02/21/2022   CL 97 02/21/2022   CO2 24 02/21/2022   BUN 12 02/21/2022   CREATININE 0.93 02/21/2022   EGFR 97 02/21/2022   CALCIUM 9.3 02/21/2022   PROT 6.4 02/21/2022   ALBUMIN 3.8 02/21/2022   LABGLOB 2.6 02/21/2022   AGRATIO 1.5 02/21/2022   BILITOT <0.2 02/21/2022   ALKPHOS 130 (H) 02/21/2022   AST 25 02/21/2022   ALT 21 02/21/2022   ANIONGAP 8 10/08/2021   Last lipids Lab Results  Component Value Date   CHOL 230 (H) 02/21/2022   HDL 103 02/21/2022   LDLCALC 117 (H) 02/21/2022   LDLDIRECT 156.0 01/18/2016   TRIG 61 02/21/2022   CHOLHDL 2.2 02/21/2022   Last hemoglobin A1c Lab Results  Component Value Date   HGBA1C 7.4 (A) 12/22/2021    Last thyroid  functions Lab Results  Component Value Date   TSH 2.010 02/21/2022   Last vitamin D Lab Results  Component Value Date   VD25OH 24.9 (L) 02/21/2022   Last vitamin B12 and Folate Lab Results  Component Value Date   VITAMINB12 568 02/21/2022   FOLATE >20.0 02/21/2022     Assessment & Plan:   Problem List Items Addressed This Visit       Hypertension associated with diabetes ([place]) (Chronic)    Amlodipine 5 mg daily was added to his antihypertensive regimen last month.  He is additionally prescribed irbesartan 300 mg daily.  His blood pressure today remains elevated, 158/77 initially and 146/88 on repeat.  He does not check his blood pressure at home currently. -Increase amlodipine to 10 mg daily -Continue irbesartan at current dose -Home BP kit prescribed today -Follow-up in 6 weeks for HTN check      Hyperlipidemia due to T1DM ([place]) - Statin Intolerant    Lipid panel updated last month.  Total cholesterol 230 and LDL 117.  He has a previously documented history of statin intolerance to atorvastatin and rosuvastatin (myalgias). -Start Repatha 140 mg biweekly injections      Vitamin D insufficiency    Noted on labs from last month.  He has started daily vitamin D supplementation.      Tobacco use - Primary    Reports today that he is contemplating cessation.  He was congratulated on his progress.  LDCT was ordered at his last appointment, however he canceled the CT because he thought he may have undergone the imaging study previously.  Once he realized that he had not been contacted radiology at [facility] and was told that a new order would need to be placed. -Referral placed to lung cancer screening program      Elevated alkaline phosphatase level    ALP 130 on labs from last month. -Repeat CMP and GGT ordered      Hyperkalemia    K+ 5.6 on labs from last month. -Repeat chemistry panel ordered today      Return in about 6 weeks (around 05/02/2022)  for HTN, HLD.   Johnette Abraham, MD

## 2022-03-21 NOTE — Assessment & Plan Note (Signed)
Lipid panel updated last month.  Total cholesterol 230 and LDL 117.  He has a previously documented history of statin intolerance to atorvastatin and rosuvastatin (myalgias). -Start Repatha 140 mg biweekly injections

## 2022-03-21 NOTE — Assessment & Plan Note (Signed)
Reports today that he is contemplating cessation.  He was congratulated on his progress.  LDCT was ordered at his last appointment, however he canceled the CT because he thought he may have undergone the imaging study previously.  Once he realized that he had not been contacted radiology at [facility] and was told that a new order would need to be placed. -Referral placed to lung cancer screening program

## 2022-03-21 NOTE — Patient Instructions (Signed)
It was a pleasure to see you today.  Thank you for giving [place] the opportunity to be involved in your care.  Below is a brief recap of your visit and next steps.  We will plan to see you again in 6 weeks.  Summary Increase amlodipine to 10 mg daily Start Repatha for high cholesterol Repeat labs New order placed for lung cancer screening Follow up in 6 weeks

## 2022-03-21 NOTE — Assessment & Plan Note (Signed)
Noted on labs from last month.  He has started daily vitamin D supplementation.

## 2022-03-21 NOTE — Assessment & Plan Note (Signed)
Amlodipine 5 mg daily was added to his antihypertensive regimen last month.  He is additionally prescribed irbesartan 300 mg daily.  His blood pressure today remains elevated, 158/77 initially and 146/88 on repeat.  He does not check his blood pressure at home currently. -Increase amlodipine to 10 mg daily -Continue irbesartan at current dose -Home BP kit prescribed today -Follow-up in 6 weeks for HTN check

## 2022-03-21 NOTE — Assessment & Plan Note (Signed)
K+ 5.6 on labs from last month. -Repeat chemistry panel ordered today

## 2022-03-24 ENCOUNTER — Telehealth: Payer: Self-pay | Admitting: Internal Medicine

## 2022-03-24 NOTE — Telephone Encounter (Signed)
[facility] called in on patient behalf.   Needs pre auth on Evolocumab [facility]) 140 MG/ML SOSY    Phone # [phone] ID# [id number]

## 2022-03-24 NOTE — Telephone Encounter (Signed)
Pre auth already put in and doing appeal.

## 2022-03-25 LAB — CMP14+EGFR
ALT: 27 IU/L (ref 0–44)
AST: 37 IU/L (ref 0–40)
Albumin/Globulin Ratio: 1.4 (ref 1.2–2.2)
Albumin: 3.7 g/dL — ABNORMAL LOW (ref 3.8–4.9)
Alkaline Phosphatase: 143 IU/L — ABNORMAL HIGH (ref 44–121)
BUN/Creatinine Ratio: 17 (ref 9–20)
BUN: 17 mg/dL (ref 6–24)
Bilirubin Total: 0.2 mg/dL (ref 0.0–1.2)
CO2: 20 mmol/L (ref 20–29)
Calcium: 9.1 mg/dL (ref 8.7–10.2)
Chloride: 90 mmol/L — ABNORMAL LOW (ref 96–106)
Creatinine, Ser: 1.02 mg/dL (ref 0.76–1.27)
Globulin, Total: 2.7 g/dL (ref 1.5–4.5)
Glucose: 202 mg/dL — ABNORMAL HIGH (ref 70–99)
Potassium: 6 mmol/L — ABNORMAL HIGH (ref 3.5–5.2)
Sodium: 125 mmol/L — ABNORMAL LOW (ref 134–144)
Total Protein: 6.4 g/dL (ref 6.0–8.5)
eGFR: 87 mL/min/{1.73_m2} (ref >59)

## 2022-03-25 LAB — GAMMA GT: GGT: 100 IU/L — ABNORMAL HIGH (ref 0–65)

## 2022-03-27 ENCOUNTER — Other Ambulatory Visit: Payer: Self-pay

## 2022-03-27 ENCOUNTER — Other Ambulatory Visit: Payer: Self-pay | Admitting: Internal Medicine

## 2022-03-27 ENCOUNTER — Encounter: Payer: Self-pay | Admitting: Nurse Practitioner

## 2022-03-27 ENCOUNTER — Ambulatory Visit (INDEPENDENT_AMBULATORY_CARE_PROVIDER_SITE_OTHER): Payer: Commercial Managed Care - PPO | Admitting: Nurse Practitioner

## 2022-03-27 VITALS — BP 138/90 | HR 73 | Ht 70.0 in | Wt 179.6 lb

## 2022-03-27 DIAGNOSIS — E559 Vitamin D deficiency, unspecified: Secondary | ICD-10-CM

## 2022-03-27 DIAGNOSIS — E782 Mixed hyperlipidemia: Secondary | ICD-10-CM

## 2022-03-27 DIAGNOSIS — I10 Essential (primary) hypertension: Secondary | ICD-10-CM | POA: Diagnosis not present

## 2022-03-27 DIAGNOSIS — R748 Abnormal levels of other serum enzymes: Secondary | ICD-10-CM

## 2022-03-27 DIAGNOSIS — E875 Hyperkalemia: Secondary | ICD-10-CM

## 2022-03-27 DIAGNOSIS — E109 Type 1 diabetes mellitus without complications: Secondary | ICD-10-CM

## 2022-03-27 LAB — POCT GLYCOSYLATED HEMOGLOBIN (HGB A1C): Hemoglobin A1C: 7.7 % — AB (ref 4.0–5.6)

## 2022-03-27 NOTE — Progress Notes (Signed)
Endocrinology Follow Up Note       03/27/2022, 9:28 AM   Subjective:    Patient ID: [name], male    DOB: [birth year].  [name] is being seen in follow up after being seen in consultation for management of currently uncontrolled symptomatic diabetes requested by  Johnette Abraham, MD.   Past Medical History:  Diagnosis Date   Allergy    Anemia    Diabetes mellitus without complication ([place])    type 1 per patient   GERD (gastroesophageal reflux disease)    HLD (hyperlipidemia)    Hypertension     Past Surgical History:  Procedure Laterality Date   ANKLE SURGERY Right    LUMBAR LAMINECTOMY/DECOMPRESSION MICRODISCECTOMY N/A 10/07/2021   Procedure: Microlumbar decompression L4-5, possible L5-S1;  Surgeon: Susa Day, MD;  Location: [facility];  Service: Orthopedics;  Laterality: N/A;   UPPER GASTROINTESTINAL ENDOSCOPY     VASECTOMY  1994    Social History   Socioeconomic History   Marital status: Married    Spouse name: Not on file   Number of children: 1   Years of education: Not on file   Highest education level: Not on file  Occupational History   Occupation: Architect  Tobacco Use   Smoking status: Every Day    Packs/day: 1.00    Years: 30.00    Total pack years: 30.00    Types: Cigarettes   Smokeless tobacco: Never  Vaping Use   Vaping Use: Never used  Substance and Sexual Activity   Alcohol use: Not Currently    Alcohol/week: 0.0 - 8.0 standard drinks of alcohol   Drug use: No   Sexual activity: Yes  Other Topics Concern   Not on file  Social History Narrative   Not on file   Social Determinants of Health   Financial Resource Strain: Not on file  Food Insecurity: Not on file  Transportation Needs: Not on file  Physical Activity: Not on file  Stress: Not on file  Social Connections: Not on file    Family History  Problem Relation Age of Onset   COPD Mother     Diabetes Mother    Early death Father 32       MI   Heart attack Father    Lymphoma Sister 67   Hypertension Brother    Diabetes Maternal Grandmother    Stroke Neg Hx    Alcohol abuse Neg Hx    Drug abuse Neg Hx    Hyperlipidemia Neg Hx    Kidney disease Neg Hx    Colon cancer Neg Hx    Esophageal cancer Neg Hx    Stomach cancer Neg Hx    Rectal cancer Neg Hx     Outpatient Encounter Medications as of 03/27/2022  Medication Sig   amLODipine (NORVASC) 10 MG tablet Take 1 tablet (10 mg total) by mouth daily.   Blood Pressure KIT Check your blood pressure 2-3 times weekly and record readings to bring to your next appointment   Continuous Blood Gluc Sensor (DEXCOM G6 SENSOR) MISC Change sensor every 10 days as directed   Continuous Blood Gluc Transmit (DEXCOM G6 TRANSMITTER) MISC Change  transmitter every 90 days as directed.   esomeprazole (NEXIUM) 40 MG capsule Take 1 capsule (40 mg total) by mouth daily at 12 noon.   Evolocumab (REPATHA) 140 MG/ML SOSY Inject 140 mg into the skin every 14 (fourteen) days.   ferrous sulfate 325 (65 FE) MG tablet Take 650 mg by mouth daily.   gabapentin (NEURONTIN) 300 MG capsule Take 300 mg by mouth 3 (three) times daily.   Insulin Disposable Pump (OMNIPOD 5 G6 INTRO, GEN 5,) KIT Change pod every 48-72 hrs   Insulin Disposable Pump (OMNIPOD 5 G6 POD, GEN 5,) MISC Change pod every 48-72 hrs   insulin lispro (HUMALOG) 100 UNIT/ML injection Use with Omnipod for TDD around 45 units per day   [DISCONTINUED] irbesartan (AVAPRO) 300 MG tablet Take 1 tablet (300 mg total) by mouth daily.   No facility-administered encounter medications on file as of 03/27/2022.    ALLERGIES: Allergies  Allergen Reactions   Crestor [Rosuvastatin Calcium] Other (See Comments)    Muscle aches   Iron Other (See Comments)    "Tore up my stomach"  "Liquid Iron"   Lipitor [Atorvastatin Calcium] Other (See Comments)    Muscles aches   Statins Other (See Comments)    Muscle  aches    VACCINATION STATUS: Immunization History  Administered Date(s) Administered   Influenza,inj,Quad PF,6+ Mos 01/18/2016, 10/17/2017, 11/06/2019, 10/28/2020   Influenza,inj,quad, With Preservative 10/30/2018   Influenza-Unspecified 10/17/2017, 10/10/2021   PFIZER(Purple Top)SARS-COV-2 Vaccination 04/10/2019, 05/05/2019   Pneumococcal Polysaccharide-23 10/21/2014   Tdap 06/02/2013    Diabetes He presents for his follow-up diabetic visit. He has type 1 diabetes mellitus. Onset time: Diagnosed with LADA at age of 37. His disease course has been stable. There are no hypoglycemic associated symptoms. Associated symptoms include blurred vision and fatigue. There are no hypoglycemic complications. Symptoms are stable. There are no diabetic complications. Risk factors for coronary artery disease include diabetes mellitus, dyslipidemia, family history, male sex and tobacco exposure. Current diabetic treatment includes insulin pump. He is compliant with treatment most of the time. His weight is fluctuating minimally. He is following a generally healthy diet. When asked about meal planning, he reported none. He has not had a previous visit with a dietitian. He participates in exercise daily. His home blood glucose trend is fluctuating minimally. His overall blood glucose range is 140-180 mg/dl. (He presents today with his CGM and pump showing stable glycemic profile.  His POCT A1c today is 7.7%, increasing slightly from last visit of 7.4%.  He admits he forgets to bolus sometimes before eating.  Analysis of his CGM shows TIR 60%, TAR 40%, TBR 0% with a GMI of 7.7%.) An ACE inhibitor/angiotensin II receptor blocker is being taken. He does not see a podiatrist.Eye exam is not current.     Review of systems  Constitutional: + steadily decreasing body weight,  current Body mass index is 25.77 kg/m. , no fatigue, no subjective hyperthermia, no subjective hypothermia Eyes: no blurry vision, no  xerophthalmia ENT: no sore throat, no nodules palpated in throat, no dysphagia/odynophagia, no hoarseness Cardiovascular: no chest pain, no shortness of breath, no palpitations, no leg swelling Respiratory: no cough, no shortness of breath Gastrointestinal: no nausea/vomiting/diarrhea Musculoskeletal: back pain improving, right leg nerve pain from recent surgery- improving (only involving foot now) Skin: no rashes, no hyperemia Neurological: no tremors,  no dizziness Psychiatric: no depression, no anxiety  Objective:     BP (!) 138/90 (BP Location: Left Arm, Patient Position: Sitting, Cuff Size: Large)  Comment: Retake with manuel Cuff  Pulse 73   Ht '5\' 10"'$  (1.778 m)   Wt 179 lb 9.6 oz (81.5 kg)   BMI 25.77 kg/m   Wt Readings from Last 3 Encounters:  03/27/22 179 lb 9.6 oz (81.5 kg)  03/21/22 179 lb 6.4 oz (81.4 kg)  02/21/22 177 lb 6.4 oz (80.5 kg)     BP Readings from Last 3 Encounters:  03/27/22 (!) 138/90  03/21/22 (!) 146/88  02/21/22 (!) 160/92      Physical Exam- Limited  Constitutional:  Body mass index is 25.77 kg/m. , not in acute distress, normal state of mind Eyes:  EOMI, no exophthalmos Musculoskeletal: no gross deformities, strength intact in all four extremities, no gross restriction of joint movements Skin:  no rashes, no hyperemia Neurological: no tremor with outstretched hands   Diabetic Foot Exam - Simple   No data filed     CMP ( most recent) CMP     Component Value Date/Time   NA 125 (L) 03/24/2022 1014   K 6.0 (H) 03/24/2022 1014   CL 90 (L) 03/24/2022 1014   CO2 20 03/24/2022 1014   GLUCOSE 202 (H) 03/24/2022 1014   GLUCOSE 310 (H) 10/08/2021 1024   BUN 17 03/24/2022 1014   CREATININE 1.02 03/24/2022 1014   CALCIUM 9.1 03/24/2022 1014   PROT 6.4 03/24/2022 1014   ALBUMIN 3.7 (L) 03/24/2022 1014   AST 37 03/24/2022 1014   ALT 27 03/24/2022 1014   ALKPHOS 143 (H) 03/24/2022 1014   BILITOT 0.2 03/24/2022 1014   GFRNONAA >60  10/08/2021 1024   GFRAA >60 10/10/2017 0542     Diabetic Labs (most recent): Lab Results  Component Value Date   HGBA1C 7.7 (A) 03/27/2022   HGBA1C 7.4 (A) 12/22/2021   HGBA1C 7.7 (A) 09/12/2021   MICROALBUR 150 06/20/2021   MICROALBUR 17.0 (H) 01/18/2016     Lipid Panel ( most recent) Lipid Panel     Component Value Date/Time   CHOL 230 (H) 02/21/2022 0918   TRIG 61 02/21/2022 0918   HDL 103 02/21/2022 0918   CHOLHDL 2.2 02/21/2022 0918   CHOLHDL 3 10/28/2020 0943   VLDL 15.4 10/28/2020 0943   LDLCALC 117 (H) 02/21/2022 0918   LDLDIRECT 156.0 01/18/2016 1020   LABVLDL 10 02/21/2022 0918      Lab Results  Component Value Date   TSH 2.010 02/21/2022   TSH 1.78 02/02/2021   TSH 1.65 10/28/2020   TSH 2.15 01/18/2016   FREET4 1.03 02/21/2022           Assessment & Plan:   1) Type 1 diabetes mellitus without complication ([place])  He presents today with his CGM and pump showing stable glycemic profile.  His POCT A1c today is 7.7%, increasing slightly from last visit of 7.4%.  He admits he forgets to bolus sometimes before eating.  Analysis of his CGM shows TIR 60%, TAR 40%, TBR 0% with a GMI of 7.7%.  - CHRISTOPER NOGUERAS has currently uncontrolled symptomatic LADA DM since 57 years of age.   -Recent labs reviewed.  - I had a long discussion with him about the progressive nature of diabetes and the pathology behind its complications. -his diabetes is complicated by drastic fluctuations and severe hypoglycemia and he remains at a high risk for more acute and chronic complications which include CAD, CVA, CKD, retinopathy, and neuropathy. These are all discussed in detail with him.  The following Lifestyle Medicine recommendations according to American  College of Lifestyle Medicine [facility]) were discussed and offered to patient and he agrees to start the journey:  A. Whole Foods, Plant-based plate comprising of fruits and vegetables, plant-based proteins, whole-grain  carbohydrates was discussed in detail with the patient.   A list for source of those nutrients were also provided to the patient.  Patient will use only water or unsweetened tea for hydration. B.  The need to stay away from risky substances including alcohol, smoking; obtaining 7 to 9 hours of restorative sleep, at least 150 minutes of moderate intensity exercise weekly, the importance of healthy social connections,  and stress reduction techniques were discussed. C.  A full color page of  Calorie density of various food groups per pound showing examples of each food groups was provided to the patient.  - Nutritional counseling repeated at each appointment due to patients tendency to fall back in to old habits.  - The patient admits there is a room for improvement in their diet and drink choices. -  Suggestion is made for the patient to avoid simple carbohydrates from their diet including Cakes, Sweet Desserts / Pastries, Ice Cream, Soda (diet and regular), Sweet Tea, Candies, Chips, Cookies, Sweet Pastries, Store Bought Juices, Alcohol in Excess of 1-2 drinks a day, Artificial Sweeteners, Coffee Creamer, and "Sugar-free" Products. This will help patient to have stable blood glucose profile and potentially avoid unintended weight Golden.   - I encouraged the patient to switch to unprocessed or minimally processed complex starch and increased protein intake (animal or plant source), fruits, and vegetables.   - Patient is advised to stick to a routine mealtimes to eat 3 meals a day and avoid unnecessary snacks (to snack only to correct hypoglycemia).  - I have approached him with the following individualized plan to manage his diabetes and patient agrees:   -I did adjust his target glucose from 120 to 110 today to help get glucose in range for more time.  We also discussed the importance of bolusing before meals, which he will work on trying to do more consistently.  -he is encouraged to continue  monitoring glucose 4 times daily (using his CGM), before meals and before bed, and to call the clinic if he has readings less than 70 or above 300 for 3 tests in a row.    - he is warned not to take insulin without proper monitoring per orders. - Adjustment parameters are given to him for hypo and hyperglycemia in writing.  - his Metformin was discontinued, risk outweighs benefit for this patient, given his type 1 diagnosis where insulin treatment is necessary.  - Specific targets for  A1c; LDL, HDL, and Triglycerides were discussed with the patient.  2) Blood Pressure /Hypertension:  his blood pressure is not controlled to target but is improving.   he is advised to continue his current medications including Norvasc 10 mg po daily.  3) Lipids/Hyperlipidemia:    Review of his recent lipid panel from 02/21/22 showed uncontrolled LDL at 117 (improving) . He is allergic to statin medications.  He is advised to avoid fried foods and butter.  4)  Weight/Diet:  his Body mass index is 25.77 kg/m.  -    he is NOT a candidate for weight loss.   Exercise, and detailed carbohydrates information provided  -  detailed on discharge instructions.  5) Chronic Care/Health Maintenance: -he is on ACEI/ARB and not on Statin medications and is encouraged to initiate and continue to follow up with Ophthalmology,  Dentist, Podiatrist at least yearly or according to recommendations, and advised to [place]. I have recommended yearly flu vaccine and pneumonia vaccine at least every 5 years; moderate intensity exercise for up to 150 minutes weekly; and sleep for at least 7 hours a day.  - he is advised to maintain close follow up with Doren Custard, Hazle Nordmann, MD for primary care needs, as well as his other providers for optimal and coordinated care.     I spent  47  minutes in the care of the patient today including review of labs from [facility], Lipids, Thyroid Function, Hematology (current and previous including abstractions  from other facilities); face-to-face time discussing  his blood glucose readings/logs, discussing hypoglycemia and hyperglycemia episodes and symptoms, medications doses, his options of short and long term treatment based on the latest standards of care / guidelines;  discussion about incorporating lifestyle medicine;  and documenting the encounter. Risk reduction counseling performed per USPSTF guidelines to reduce obesity and cardiovascular risk factors.     Please refer to Patient Instructions for Blood Glucose Monitoring and Insulin/Medications Dosing Guide"  in media tab for additional information. Please  also refer to " Patient Self Inventory" in the Media  tab for reviewed elements of pertinent patient history.  [name] participated in the discussions, expressed understanding, and voiced agreement with the above plans.  All questions were answered to his satisfaction. he is encouraged to contact clinic should he have any questions or concerns prior to his return visit.     Follow up plan: - Return in about 4 months (around 07/27/2022) for Diabetes F/U with A1c in office, No previsit labs, Bring meter and logs.    Rayetta Pigg, [facility] [address] [place],  [address] Phone: [phone] Fax: [phone]  03/27/2022, 9:28 AM

## 2022-04-07 ENCOUNTER — Encounter: Payer: Self-pay | Admitting: Internal Medicine

## 2022-04-07 ENCOUNTER — Ambulatory Visit (HOSPITAL_COMMUNITY)
Admission: RE | Admit: 2022-04-07 | Discharge: 2022-04-07 | Disposition: A | Discharge status: Home / Self Care | Payer: Commercial Managed Care - PPO | Source: Ambulatory Visit | Attending: Internal Medicine | Admitting: Internal Medicine

## 2022-04-07 DIAGNOSIS — R748 Abnormal levels of other serum enzymes: Secondary | ICD-10-CM | POA: Insufficient documentation

## 2022-04-07 NOTE — Telephone Encounter (Signed)
Pt called to speak to nurse about mychart message   [phone]

## 2022-04-10 ENCOUNTER — Ambulatory Visit (HOSPITAL_COMMUNITY): Payer: Commercial Managed Care - PPO

## 2022-04-25 ENCOUNTER — Other Ambulatory Visit: Payer: Self-pay | Admitting: Internal Medicine

## 2022-04-25 DIAGNOSIS — I152 Hypertension secondary to endocrine disorders: Secondary | ICD-10-CM

## 2022-04-27 ENCOUNTER — Ambulatory Visit: Payer: Commercial Managed Care - PPO | Admitting: Internal Medicine

## 2022-05-03 ENCOUNTER — Encounter: Payer: Self-pay | Admitting: Internal Medicine

## 2022-05-03 ENCOUNTER — Ambulatory Visit (INDEPENDENT_AMBULATORY_CARE_PROVIDER_SITE_OTHER): Payer: Commercial Managed Care - PPO | Admitting: Internal Medicine

## 2022-05-03 VITALS — BP 155/91 | HR 86 | Ht 70.0 in | Wt 184.8 lb

## 2022-05-03 DIAGNOSIS — E1069 Type 1 diabetes mellitus with other specified complication: Secondary | ICD-10-CM

## 2022-05-03 DIAGNOSIS — R600 Localized edema: Secondary | ICD-10-CM | POA: Diagnosis not present

## 2022-05-03 DIAGNOSIS — I152 Hypertension secondary to endocrine disorders: Secondary | ICD-10-CM | POA: Diagnosis not present

## 2022-05-03 DIAGNOSIS — E1159 Type 2 diabetes mellitus with other circulatory complications: Secondary | ICD-10-CM | POA: Diagnosis not present

## 2022-05-03 DIAGNOSIS — E785 Hyperlipidemia, unspecified: Secondary | ICD-10-CM

## 2022-05-03 DIAGNOSIS — L231 Allergic contact dermatitis due to adhesives: Secondary | ICD-10-CM | POA: Diagnosis not present

## 2022-05-03 DIAGNOSIS — L259 Unspecified contact dermatitis, unspecified cause: Secondary | ICD-10-CM | POA: Insufficient documentation

## 2022-05-03 MED ORDER — AMLODIPINE BESYLATE 5 MG PO TABS: 5.0000 mg | ORAL_TABLET | Freq: Every day | ORAL | 1 refills | Status: DC

## 2022-05-03 MED ORDER — CHLORTHALIDONE 25 MG PO TABS: 25.0000 mg | ORAL_TABLET | Freq: Every day | ORAL | 2 refills | Status: DC

## 2022-05-03 NOTE — Assessment & Plan Note (Addendum)
Repatha was started last month.  He has not experienced any adverse side effects.  Repeat lipid panel in 3 months.

## 2022-05-03 NOTE — Progress Notes (Signed)
Established Patient Office Visit  Subjective   Patient ID: [name], male    DOB: Dec 13, 1965  Age: 57 y.o. MRN: [id number]  Chief Complaint  Patient presents with   Hypertension    Follow up   Mr. [name] returns to care today for HTN and HLD follow-up.  He was last evaluated by me on 3/5 at which time his blood pressure remains significantly elevated.  Amlodipine was increased to 10 mg daily.  He was continued on irbesartan 300 mg daily.  6-week follow-up was arranged for HTN check.  He was also started on Repatha biweekly injections in the setting of poorly controlled hyperlipidemia with a prior history of statin intolerance.  In the interim he has been seen by endocrinology for follow-up and has also been evaluated by orthopedic surgery for lumbar back pain.  Mr. [name] acute concern today is bilateral lower extremity edema that has been intermittently present since his last appointment.  He denies symptoms of dyspnea on exertion and orthopnea/PND.  His orthopedic surgeon recommended that he wear compression stockings to alleviate his symptoms.  He has no additional concerns to discuss today.  Past Medical History:  Diagnosis Date   Allergy    Anemia    Diabetes mellitus without complication    type 1 per patient   GERD (gastroesophageal reflux disease)    HLD (hyperlipidemia)    Hypertension    Past Surgical History:  Procedure Laterality Date   ANKLE SURGERY Right    LUMBAR LAMINECTOMY/DECOMPRESSION MICRODISCECTOMY N/A 10/07/2021   Procedure: Microlumbar decompression L4-5, possible L5-S1;  Surgeon: Jene Every, MD;  Location: [facility];  Service: Orthopedics;  Laterality: N/A;   UPPER GASTROINTESTINAL ENDOSCOPY     VASECTOMY  1994   Social History   Tobacco Use   Smoking status: Every Day    Packs/day: 1.00    Years: 30.00    Additional pack years: 0.00    Total pack years: 30.00    Types: Cigarettes   Smokeless tobacco: Never  Vaping Use   Vaping Use: Never used   Substance Use Topics   Alcohol use: Not Currently    Alcohol/week: 0.0 - 8.0 standard drinks of alcohol   Drug use: No   Family History  Problem Relation Age of Onset   COPD Mother    Diabetes Mother    Early death Father 54       MI   Heart attack Father    Lymphoma Sister 63   Hypertension Brother    Diabetes Maternal Grandmother    Stroke Neg Hx    Alcohol abuse Neg Hx    Drug abuse Neg Hx    Hyperlipidemia Neg Hx    Kidney disease Neg Hx    Colon cancer Neg Hx    Esophageal cancer Neg Hx    Stomach cancer Neg Hx    Rectal cancer Neg Hx    Allergies  Allergen Reactions   Crestor [Rosuvastatin Calcium] Other (See Comments)    Muscle aches   Iron Other (See Comments)    "Tore up my stomach"  "Liquid Iron"   Lipitor [Atorvastatin Calcium] Other (See Comments)    Muscles aches   Statins Other (See Comments)    Muscle aches   Review of Systems  Cardiovascular:  Positive for leg swelling.  All other systems reviewed and are negative.    Objective:     BP (!) 155/91   Pulse 86   Ht  (1.778 m)  Wt 184 lb 12.8 oz (83.8 kg)   SpO2 95%   BMI 26.52 kg/m  BP Readings from Last 3 Encounters:  05/03/22 (!) 155/91  03/27/22 (!) 138/90  03/21/22 (!) 146/88   Physical Exam Vitals reviewed.  Constitutional:      General: He is not in acute distress.    Appearance: Normal appearance. He is not ill-appearing.  HENT:     Head: Normocephalic and atraumatic.     Right Ear: External ear normal.     Left Ear: External ear normal.     Nose: Nose normal. No congestion or rhinorrhea.     Mouth/Throat:     Mouth: Mucous membranes are moist.     Pharynx: Oropharynx is clear.  Eyes:     General: No scleral icterus.    Extraocular Movements: Extraocular movements intact.     Conjunctiva/sclera: Conjunctivae normal.     Pupils: Pupils are equal, round, and reactive to light.  Cardiovascular:     Rate and Rhythm: Normal rate and regular rhythm.     Pulses: Normal  pulses.     Heart sounds: Normal heart sounds. No murmur heard. Pulmonary:     Effort: Pulmonary effort is normal.     Breath sounds: Normal breath sounds. No wheezing, rhonchi or rales.  Abdominal:     General: Abdomen is flat. Bowel sounds are normal. There is no distension.     Palpations: Abdomen is soft.     Tenderness: There is no abdominal tenderness.  Musculoskeletal:        General: No swelling or deformity. Normal range of motion.     Cervical back: Normal range of motion.     Right lower leg: Edema present.     Left lower leg: Edema present.  Skin:    General: Skin is warm and dry.     Capillary Refill: Capillary refill takes less than 2 seconds.  Neurological:     General: No focal deficit present.     Mental Status: He is alert and oriented to person, place, and time.     Motor: No weakness.  Psychiatric:        Mood and Affect: Mood normal.        Behavior: Behavior normal.        Thought Content: Thought content normal.   Last CBC Lab Results  Component Value Date   WBC 8.2 02/21/2022   HGB 13.7 02/21/2022   HCT 40.2 02/21/2022   MCV 96 02/21/2022   MCH 32.6 02/21/2022   RDW 11.9 02/21/2022   PLT 361 02/21/2022   Last metabolic panel Lab Results  Component Value Date   GLUCOSE 202 (H) 03/24/2022   NA 125 (L) 03/24/2022   K 6.0 (H) 03/24/2022   CL 90 (L) 03/24/2022   CO2 20 03/24/2022   BUN 17 03/24/2022   CREATININE 1.02 03/24/2022   EGFR 87 03/24/2022   CALCIUM 9.1 03/24/2022   PROT 6.4 03/24/2022   ALBUMIN 3.7 (L) 03/24/2022   LABGLOB 2.7 03/24/2022   AGRATIO 1.4 03/24/2022   BILITOT 0.2 03/24/2022   ALKPHOS 143 (H) 03/24/2022   AST 37 03/24/2022   ALT 27 03/24/2022   ANIONGAP 8 10/08/2021   Last lipids Lab Results  Component Value Date   CHOL 230 (H) 02/21/2022   HDL 103 02/21/2022   LDLCALC 117 (H) 02/21/2022   LDLDIRECT 156.0 01/18/2016   TRIG 61 02/21/2022   CHOLHDL 2.2 02/21/2022   Last hemoglobin A1c Lab Results  Component  Value Date   HGBA1C 7.7 (A) 03/27/2022   Last thyroid functions Lab Results  Component Value Date   TSH 2.010 02/21/2022   Last vitamin D Lab Results  Component Value Date   VD25OH 24.9 (L) 02/21/2022   Last vitamin B12 and Folate Lab Results  Component Value Date   VITAMINB12 568 02/21/2022   FOLATE >20.0 02/21/2022      Assessment & Plan:   Problem List Items Addressed This Visit       Hypertension associated with diabetes - Primary (Chronic)    Returning to care today for HTN follow-up.  Amlodipine was increased to 10 mg daily at his last appointment.  He is additionally prescribed irbesartan 300 mg daily.  He has experienced bilateral lower extremity edema over the last month, which is likely due to to amlodipine.  This was reviewed today.  BP remains elevated, 156/94 initially and 155/91 on repeat. -Decrease amlodipine to 5 mg daily -Add chlorthalidone 25 mg daily -Continue irbesartan at current dose -Follow-up in 2 weeks for HTN check      Hyperlipidemia due to T1DM ([facility]) - Statin Intolerant    Repatha was started last month.  He has not experienced any adverse side effects.  Repeat lipid panel in 3 months.      Contact dermatitis    Noted on his abdomen at the site of his Dexcom.  He states that this has happened the last 4 times he has changed his Dexcom sensor.  He plans to contact Dex[employer] to see if they have changed their adhesive.  Recommended application of Benadryl cream at the site of dermatitis for symptom relief.      Leg edema    He endorses a recent history of bilateral lower extremity edema, which is likely a side effect of increasing amlodipine.  Amlodipine has been reduced to 5 mg daily and chlorthalidone has been added.  He will follow-up for reassessment in 2 weeks.       Return in about 2 weeks (around 05/17/2022) for HTN.    Billie Lade, MD

## 2022-05-03 NOTE — Patient Instructions (Signed)
It was a pleasure to see you today.  Thank you for giving [place] the opportunity to be involved in your care.  Below is a brief recap of your visit and next steps.  We will plan to see you again in 2 weeks.  Summary Decrease amlodipine back to 5 mg daily Add chlorthalidone 25 mg daily Try CoQ 10 for joint aches Follow up in 2 weeks for BP check I recommend contacting [employer] to discuss your allergic reaction

## 2022-05-03 NOTE — Assessment & Plan Note (Signed)
Returning to care today for HTN follow-up.  Amlodipine was increased to 10 mg daily at his last appointment.  He is additionally prescribed irbesartan 300 mg daily.  He has experienced bilateral lower extremity edema over the last month, which is likely due to to amlodipine.  This was reviewed today.  BP remains elevated, 156/94 initially and 155/91 on repeat. -Decrease amlodipine to 5 mg daily -Add chlorthalidone 25 mg daily -Continue irbesartan at current dose -Follow-up in 2 weeks for HTN check

## 2022-05-03 NOTE — Assessment & Plan Note (Signed)
Noted on his abdomen at the site of his Dexcom.  He states that this has happened the last 4 times he has changed his Dexcom sensor.  He plans to contact [employer] to see if they have changed their adhesive.  Recommended application of Benadryl cream at the site of dermatitis for symptom relief.

## 2022-05-03 NOTE — Assessment & Plan Note (Signed)
He endorses a recent history of bilateral lower extremity edema, which is likely a side effect of increasing amlodipine.  Amlodipine has been reduced to 5 mg daily and chlorthalidone has been added.  He will follow-up for reassessment in 2 weeks.

## 2022-05-05 ENCOUNTER — Ambulatory Visit: Payer: Commercial Managed Care - PPO | Admitting: Internal Medicine

## 2022-05-19 ENCOUNTER — Ambulatory Visit (INDEPENDENT_AMBULATORY_CARE_PROVIDER_SITE_OTHER): Payer: Commercial Managed Care - PPO | Admitting: Internal Medicine

## 2022-05-19 ENCOUNTER — Encounter: Payer: Self-pay | Admitting: Internal Medicine

## 2022-05-19 VITALS — BP 174/87 | HR 74 | Ht 70.0 in | Wt 182.4 lb

## 2022-05-19 DIAGNOSIS — E1159 Type 2 diabetes mellitus with other circulatory complications: Secondary | ICD-10-CM | POA: Diagnosis not present

## 2022-05-19 DIAGNOSIS — E559 Vitamin D deficiency, unspecified: Secondary | ICD-10-CM

## 2022-05-19 DIAGNOSIS — I152 Hypertension secondary to endocrine disorders: Secondary | ICD-10-CM | POA: Diagnosis not present

## 2022-05-19 DIAGNOSIS — E875 Hyperkalemia: Secondary | ICD-10-CM | POA: Diagnosis not present

## 2022-05-19 NOTE — Assessment & Plan Note (Signed)
Presenting today for HTN follow-up.  His current antihypertensive regimen consists of amlodipine 5 mg daily and chlorthalidone 25 mg daily.  He has previously been prescribed irbesartan but was instructed to hold this medication in the setting of hyperkalemia.  BP remains elevated today, 174/87 initially and 162/80 on repeat. -Repeat BMP ordered today to follow-up hyperkalemia -Additional antihypertensive therapy as indicated but will be determined based on lab results.  If hyperkalemia has resolved, he would like to start lisinopril as this is what his wife currently takes and it is effective for her.  If he remains hyperkalemic, consider carvedilol. -Follow-up in 4 weeks for HTN check

## 2022-05-19 NOTE — Progress Notes (Signed)
Established Patient Office Visit  Subjective   Patient ID: [name], male    DOB: 14-Dec-1965  Age: 57 y.o. MRN: [id number]  Chief Complaint  Patient presents with   Hypertension    Two week follow up   [name] returns to care today for HTN follow-up.  Last evaluated by me on 4/17 for HTN follow-up at which time he endorsed bilateral lower extremity edema that developed over the previous month after increasing amlodipine to 10 mg daily.  Amlodipine was reduced to 5 mg daily and chlorthalidone 25 mg daily was added to his antihypertensive regimen.  There have been no acute interval events.  [name] reports feeling well today.  His lower extremity edema is gradually resolving.  He has no additional concerns to discuss today.  Past Medical History:  Diagnosis Date   Allergy    Anemia    Diabetes mellitus without complication (HCC)    type 1 per patient   GERD (gastroesophageal reflux disease)    HLD (hyperlipidemia)    Hypertension    Past Surgical History:  Procedure Laterality Date   ANKLE SURGERY Right    LUMBAR LAMINECTOMY/DECOMPRESSION MICRODISCECTOMY N/A 10/07/2021   Procedure: Microlumbar decompression L4-5, possible L5-S1;  Surgeon: Jene Every, MD;  Location: [facility] OR;  Service: Orthopedics;  Laterality: N/A;   UPPER GASTROINTESTINAL ENDOSCOPY     VASECTOMY  1994   Social History   Tobacco Use   Smoking status: Every Day    Packs/day: 1.00    Years: 30.00    Additional pack years: 0.00    Total pack years: 30.00    Types: Cigarettes   Smokeless tobacco: Never  Vaping Use   Vaping Use: Never used  Substance Use Topics   Alcohol use: Not Currently    Alcohol/week: 0.0 - 8.0 standard drinks of alcohol   Drug use: No   Family History  Problem Relation Age of Onset   COPD Mother    Diabetes Mother    Early death Father 19       MI   Heart attack Father    Lymphoma Sister 92   Hypertension Brother    Diabetes Maternal Grandmother    Stroke Neg Hx     Alcohol abuse Neg Hx    Drug abuse Neg Hx    Hyperlipidemia Neg Hx    Kidney disease Neg Hx    Colon cancer Neg Hx    Esophageal cancer Neg Hx    Stomach cancer Neg Hx    Rectal cancer Neg Hx    Allergies  Allergen Reactions   Crestor [Rosuvastatin Calcium] Other (See Comments)    Muscle aches   Iron Other (See Comments)    "Tore up my stomach"  "Liquid Iron"   Lipitor [Atorvastatin Calcium] Other (See Comments)    Muscles aches   Statins Other (See Comments)    Muscle aches   Review of Systems  Constitutional:  Negative for chills and fever.  HENT:  Negative for sore throat.   Respiratory:  Negative for cough and shortness of breath.   Cardiovascular:  Negative for chest pain, palpitations and leg swelling.  Gastrointestinal:  Negative for abdominal pain, blood in stool, constipation, diarrhea, nausea and vomiting.  Genitourinary:  Negative for dysuria and hematuria.  Musculoskeletal:  Negative for myalgias.  Skin:  Negative for itching and rash.  Neurological:  Negative for dizziness and headaches.  Psychiatric/Behavioral:  Negative for depression and suicidal ideas.  Objective:     BP (!) 174/87   Pulse 74   Ht 5\' 10"  (1.778 m)   Wt 182 lb 6.4 oz (82.7 kg)   SpO2 98%   BMI 26.17 kg/m  BP Readings from Last 3 Encounters:  05/19/22 (!) 174/87  05/03/22 (!) 155/91  03/27/22 (!) 138/90   Physical Exam Vitals reviewed.  Constitutional:      General: He is not in acute distress.    Appearance: Normal appearance. He is not ill-appearing.  HENT:     Head: Normocephalic and atraumatic.     Right Ear: External ear normal.     Left Ear: External ear normal.     Nose: Nose normal. No congestion or rhinorrhea.     Mouth/Throat:     Mouth: Mucous membranes are moist.     Pharynx: Oropharynx is clear.  Eyes:     General: No scleral icterus.    Extraocular Movements: Extraocular movements intact.     Conjunctiva/sclera: Conjunctivae normal.     Pupils: Pupils  are equal, round, and reactive to light.  Cardiovascular:     Rate and Rhythm: Normal rate and regular rhythm.     Pulses: Normal pulses.     Heart sounds: Normal heart sounds. No murmur heard. Pulmonary:     Effort: Pulmonary effort is normal.     Breath sounds: Normal breath sounds. No wheezing, rhonchi or rales.  Abdominal:     General: Abdomen is flat. Bowel sounds are normal. There is no distension.     Palpations: Abdomen is soft.     Tenderness: There is no abdominal tenderness.  Musculoskeletal:        General: No swelling or deformity. Normal range of motion.     Cervical back: Normal range of motion.     Right lower leg: Edema present.     Left lower leg: Edema present.  Skin:    General: Skin is warm and dry.     Capillary Refill: Capillary refill takes less than 2 seconds.  Neurological:     General: No focal deficit present.     Mental Status: He is alert and oriented to person, place, and time.     Motor: No weakness.  Psychiatric:        Mood and Affect: Mood normal.        Behavior: Behavior normal.        Thought Content: Thought content normal.   Last CBC Lab Results  Component Value Date   WBC 8.2 02/21/2022   HGB 13.7 02/21/2022   HCT 40.2 02/21/2022   MCV 96 02/21/2022   MCH 32.6 02/21/2022   RDW 11.9 02/21/2022   PLT 361 02/21/2022   Last metabolic panel Lab Results  Component Value Date   GLUCOSE 202 (H) 03/24/2022   NA 125 (L) 03/24/2022   K 6.0 (H) 03/24/2022   CL 90 (L) 03/24/2022   CO2 20 03/24/2022   BUN 17 03/24/2022   CREATININE 1.02 03/24/2022   EGFR 87 03/24/2022   CALCIUM 9.1 03/24/2022   PROT 6.4 03/24/2022   ALBUMIN 3.7 (L) 03/24/2022   LABGLOB 2.7 03/24/2022   AGRATIO 1.4 03/24/2022   BILITOT 0.2 03/24/2022   ALKPHOS 143 (H) 03/24/2022   AST 37 03/24/2022   ALT 27 03/24/2022   ANIONGAP 8 10/08/2021   Last lipids Lab Results  Component Value Date   CHOL 230 (H) 02/21/2022   HDL 103 02/21/2022   LDLCALC 117 (H)  02/21/2022  LDLDIRECT 156.0 01/18/2016   TRIG 61 02/21/2022   CHOLHDL 2.2 02/21/2022   Last hemoglobin A1c Lab Results  Component Value Date   HGBA1C 7.7 (A) 03/27/2022   Last thyroid functions Lab Results  Component Value Date   TSH 2.010 02/21/2022   Last vitamin D Lab Results  Component Value Date   VD25OH 24.9 (L) 02/21/2022   Last vitamin B12 and Folate Lab Results  Component Value Date   VITAMINB12 568 02/21/2022   FOLATE >20.0 02/21/2022     Assessment & Plan:   Problem List Items Addressed This Visit       Hypertension associated with diabetes (HCC) - Primary (Chronic)    Presenting today for HTN follow-up.  His current antihypertensive regimen consists of amlodipine 5 mg daily and chlorthalidone 25 mg daily.  He has previously been prescribed irbesartan but was instructed to hold this medication in the setting of hyperkalemia.  BP remains elevated today, 174/87 initially and 162/80 on repeat. -Repeat BMP ordered today to follow-up hyperkalemia -Additional antihypertensive therapy as indicated but will be determined based on lab results.  If hyperkalemia has resolved, he would like to start lisinopril as this is what his wife currently takes and it is effective for her.  If he remains hyperkalemic, consider carvedilol. -Follow-up in 4 weeks for HTN check      Vitamin D insufficiency    Noted on recent labs.  He is currently on daily vitamin D supplementation and requests to have his vitamin D level repeated today.      Hyperkalemia    K+ 6.0 on labs from last month.  Irbesartan has been held. -Repeat BMP ordered today       Return in about 4 weeks (around 06/16/2022) for HTN.    Billie Lade, MD

## 2022-05-19 NOTE — Patient Instructions (Signed)
It was a pleasure to see you today.  Thank you for giving [place] the opportunity to be involved in your care.  Below is a brief recap of your visit and next steps.  We will plan to see you again in 4 weeks.  Summary Check BMP and vitamin D I will follow up with you pending lab results to discuss which medication will be appropriate to take for your blood pressure

## 2022-05-19 NOTE — Assessment & Plan Note (Signed)
K+ 6.0 on labs from last month.  Irbesartan has been held. -Repeat BMP ordered today

## 2022-05-19 NOTE — Assessment & Plan Note (Signed)
Noted on recent labs.  He is currently on daily vitamin D supplementation and requests to have his vitamin D level repeated today.

## 2022-05-20 LAB — BASIC METABOLIC PANEL
BUN/Creatinine Ratio: 16 (ref 9–20)
BUN: 15 mg/dL (ref 6–24)
CO2: 28 mmol/L (ref 20–29)
Calcium: 9 mg/dL (ref 8.7–10.2)
Chloride: 84 mmol/L — ABNORMAL LOW (ref 96–106)
Creatinine, Ser: 0.93 mg/dL (ref 0.76–1.27)
Glucose: 176 mg/dL — ABNORMAL HIGH (ref 70–99)
Potassium: 4.1 mmol/L (ref 3.5–5.2)
Sodium: 126 mmol/L — ABNORMAL LOW (ref 134–144)
eGFR: 96 mL/min/{1.73_m2} (ref >59)

## 2022-05-20 LAB — VITAMIN D 25 HYDROXY (VIT D DEFICIENCY, FRACTURES): Vit D, 25-Hydroxy: 39.9 ng/mL (ref 30.0–100.0)

## 2022-05-22 ENCOUNTER — Other Ambulatory Visit: Payer: Self-pay | Admitting: Internal Medicine

## 2022-05-22 ENCOUNTER — Encounter: Payer: Self-pay | Admitting: Internal Medicine

## 2022-05-22 DIAGNOSIS — E1159 Type 2 diabetes mellitus with other circulatory complications: Secondary | ICD-10-CM

## 2022-05-22 MED ORDER — OLMESARTAN MEDOXOMIL 20 MG PO TABS
20.0000 mg | ORAL_TABLET | Freq: Every day | ORAL | 2 refills | Status: DC
Start: 2022-05-22 — End: 2022-06-18

## 2022-06-03 ENCOUNTER — Other Ambulatory Visit: Payer: Self-pay | Admitting: Internal Medicine

## 2022-06-03 DIAGNOSIS — I152 Hypertension secondary to endocrine disorders: Secondary | ICD-10-CM

## 2022-06-16 ENCOUNTER — Encounter: Payer: Self-pay | Admitting: Internal Medicine

## 2022-06-16 ENCOUNTER — Ambulatory Visit (INDEPENDENT_AMBULATORY_CARE_PROVIDER_SITE_OTHER): Payer: Commercial Managed Care - PPO | Admitting: Internal Medicine

## 2022-06-16 VITALS — BP 147/85 | HR 79 | Ht 70.0 in | Wt 177.2 lb

## 2022-06-16 DIAGNOSIS — E1159 Type 2 diabetes mellitus with other circulatory complications: Secondary | ICD-10-CM

## 2022-06-16 DIAGNOSIS — I152 Hypertension secondary to endocrine disorders: Secondary | ICD-10-CM

## 2022-06-16 NOTE — Assessment & Plan Note (Signed)
Presenting today for HTN follow-up.  Last evaluated by me on 5/3 at which time olmesartan 20 mg daily was added to his antihypertensive regimen.  He is additionally prescribed amlodipine 5 mg daily and chlorthalidone 25 mg daily.  BP today is improving but remains above goal.  151/94 initially and 147/85 on repeat.  He has not experienced any adverse side effects with recent medication adjustments. -No medication changes today.  Repeat BMP ordered.  If renal function and electrolytes are stable, we will plan to increase olmesartan to 40 mg daily. -We will tentatively plan for follow-up in 3 months

## 2022-06-16 NOTE — Patient Instructions (Signed)
It was a pleasure to see you today.  Thank you for giving [place] the opportunity to be involved in your care.  Below is a brief recap of your visit and next steps.  We will plan to see you again in 3 months.  Summary No medication changes today We will plan to increase olmesartan to 40 mg daily if your potassium level and kidney function are normal Tentatively plan for follow up in 3 months

## 2022-06-16 NOTE — Progress Notes (Signed)
Established Patient Office Visit  Subjective   Patient ID: [name], male    DOB: 02/15/1965  Age: 57 y.o. MRN: [id number]  Chief Complaint  Patient presents with   Hypertension    Follow up   Mr. [name] returns to care today for HTN follow-up.  He was last evaluated by me on 5/3.  Repeat labs were ordered in the setting of hyperkalemia and ultimately olmesartan 20 mg daily was added to his antihypertensive regimen.  There have been no acute interval events.  Mr. [name] reports feeling well today.  He is asymptomatic and has no additional concerns to discuss.  Past Medical History:  Diagnosis Date   Allergy    Anemia    Diabetes mellitus without complication (HCC)    type 1 per patient   GERD (gastroesophageal reflux disease)    HLD (hyperlipidemia)    Hypertension    Past Surgical History:  Procedure Laterality Date   ANKLE SURGERY Right    LUMBAR LAMINECTOMY/DECOMPRESSION MICRODISCECTOMY N/A 10/07/2021   Procedure: Microlumbar decompression L4-5, possible L5-S1;  Surgeon: Bradley Every, MD;  Location: [facility] OR;  Service: Orthopedics;  Laterality: N/A;   UPPER GASTROINTESTINAL ENDOSCOPY     VASECTOMY  1994   Social History   Tobacco Use   Smoking status: Golden Day    Packs/day: 1.00    Years: 30.00    Additional pack years: 0.00    Total pack years: 30.00    Types: Cigarettes   Smokeless tobacco: Never  Vaping Use   Vaping Use: Never used  Substance Use Topics   Alcohol use: Not Currently    Alcohol/week: 0.0 - 8.0 standard drinks of alcohol   Drug use: No   Family History  Problem Relation Age of Onset   COPD Mother    Diabetes Mother    Early death Father 17       MI   Heart attack Father    Lymphoma Sister 80   Hypertension Brother    Diabetes Maternal Grandmother    Stroke Neg Hx    Alcohol abuse Neg Hx    Drug abuse Neg Hx    Hyperlipidemia Neg Hx    Kidney disease Neg Hx    Colon cancer Neg Hx    Esophageal cancer Neg Hx    Stomach cancer Neg  Hx    Rectal cancer Neg Hx    Allergies  Allergen Reactions   Crestor [Rosuvastatin Calcium] Other (See Comments)    Muscle aches   Iron Other (See Comments)    "Tore up my stomach"  "Liquid Iron"   Lipitor [Atorvastatin Calcium] Other (See Comments)    Muscles aches   Statins Other (See Comments)    Muscle aches   Review of Systems  Constitutional:  Negative for chills and fever.  HENT:  Negative for sore throat.   Respiratory:  Negative for cough and shortness of breath.   Cardiovascular:  Negative for chest pain, palpitations and leg swelling.  Gastrointestinal:  Negative for abdominal pain, blood in stool, constipation, diarrhea, nausea and vomiting.  Genitourinary:  Negative for dysuria and hematuria.  Musculoskeletal:  Negative for myalgias.  Skin:  Negative for itching and rash.  Neurological:  Negative for dizziness and headaches.  Psychiatric/Behavioral:  Negative for depression and suicidal ideas.      Objective:     BP (!) 147/85   Pulse 79   Ht 5\' 10"  (1.778 m)   Wt 177 lb 3.2 oz (80.4 kg)  SpO2 98%   BMI 25.43 kg/m  BP Readings from Last 3 Encounters:  06/16/22 (!) 147/85  05/19/22 (!) 174/87  05/03/22 (!) 155/91   Physical Exam Vitals reviewed.  Constitutional:      General: He is not in acute distress.    Appearance: Normal appearance. He is not ill-appearing.  HENT:     Head: Normocephalic and atraumatic.     Right Ear: External ear normal.     Left Ear: External ear normal.     Nose: Nose normal. No congestion or rhinorrhea.     Mouth/Throat:     Mouth: Mucous membranes are moist.     Pharynx: Oropharynx is clear.  Eyes:     General: No scleral icterus.    Extraocular Movements: Extraocular movements intact.     Conjunctiva/sclera: Conjunctivae normal.     Pupils: Pupils are equal, round, and reactive to light.  Cardiovascular:     Rate and Rhythm: Normal rate and regular rhythm.     Pulses: Normal pulses.     Heart sounds: Normal heart  sounds. No murmur heard. Pulmonary:     Effort: Pulmonary effort is normal.     Breath sounds: Normal breath sounds. No wheezing, rhonchi or rales.  Abdominal:     General: Abdomen is flat. Bowel sounds are normal. There is no distension.     Palpations: Abdomen is soft.     Tenderness: There is no abdominal tenderness.  Musculoskeletal:        General: No swelling or deformity. Normal range of motion.     Cervical back: Normal range of motion.  Skin:    General: Skin is warm and dry.     Capillary Refill: Capillary refill takes less than 2 seconds.  Neurological:     General: No focal deficit present.     Mental Status: He is alert and oriented to person, place, and time.     Motor: No weakness.  Psychiatric:        Mood and Affect: Mood normal.        Behavior: Behavior normal.        Thought Content: Thought content normal.   Last CBC Lab Results  Component Value Date   WBC 8.2 02/21/2022   HGB 13.7 02/21/2022   HCT 40.2 02/21/2022   MCV 96 02/21/2022   MCH 32.6 02/21/2022   RDW 11.9 02/21/2022   PLT 361 02/21/2022   Last metabolic panel Lab Results  Component Value Date   GLUCOSE 176 (H) 05/19/2022   NA 126 (L) 05/19/2022   K 4.1 05/19/2022   CL 84 (L) 05/19/2022   CO2 28 05/19/2022   BUN 15 05/19/2022   CREATININE 0.93 05/19/2022   EGFR 96 05/19/2022   CALCIUM 9.0 05/19/2022   PROT 6.4 03/24/2022   ALBUMIN 3.7 (L) 03/24/2022   LABGLOB 2.7 03/24/2022   AGRATIO 1.4 03/24/2022   BILITOT 0.2 03/24/2022   ALKPHOS 143 (H) 03/24/2022   AST 37 03/24/2022   ALT 27 03/24/2022   ANIONGAP 8 10/08/2021   Last lipids Lab Results  Component Value Date   CHOL 230 (H) 02/21/2022   HDL 103 02/21/2022   LDLCALC 117 (H) 02/21/2022   LDLDIRECT 156.0 01/18/2016   TRIG 61 02/21/2022   CHOLHDL 2.2 02/21/2022   Last hemoglobin A1c Lab Results  Component Value Date   HGBA1C 7.7 (A) 03/27/2022   Last thyroid functions Lab Results  Component Value Date   TSH 2.010  02/21/2022  Last vitamin D Lab Results  Component Value Date   VD25OH 39.9 05/19/2022   Last vitamin B12 and Folate Lab Results  Component Value Date   VITAMINB12 568 02/21/2022   FOLATE >20.0 02/21/2022     Assessment & Plan:   Problem List Items Addressed This Visit       Hypertension associated with diabetes (HCC) - Primary (Chronic)    Presenting today for HTN follow-up.  Last evaluated by me on 5/3 at which time olmesartan 20 mg daily was added to his antihypertensive regimen.  He is additionally prescribed amlodipine 5 mg daily and chlorthalidone 25 mg daily.  BP today is improving but remains above goal.  151/94 initially and 147/85 on repeat.  He has not experienced any adverse side effects with recent medication adjustments. -No medication changes today.  Repeat BMP ordered.  If renal function and electrolytes are stable, we will plan to increase olmesartan to 40 mg daily. -We will tentatively plan for follow-up in 3 months      Return in about 3 months (around 09/16/2022).   Billie Lade, MD

## 2022-06-17 LAB — BASIC METABOLIC PANEL
BUN/Creatinine Ratio: 12 (ref 9–20)
BUN: 13 mg/dL (ref 6–24)
CO2: 24 mmol/L (ref 20–29)
Calcium: 9.1 mg/dL (ref 8.7–10.2)
Chloride: 86 mmol/L — ABNORMAL LOW (ref 96–106)
Creatinine, Ser: 1.08 mg/dL (ref 0.76–1.27)
Glucose: 213 mg/dL — ABNORMAL HIGH (ref 70–99)
Potassium: 4.8 mmol/L (ref 3.5–5.2)
Sodium: 125 mmol/L — ABNORMAL LOW (ref 134–144)
eGFR: 81 mL/min/{1.73_m2} (ref >59)

## 2022-06-18 ENCOUNTER — Other Ambulatory Visit: Payer: Self-pay | Admitting: Internal Medicine

## 2022-06-18 DIAGNOSIS — I152 Hypertension secondary to endocrine disorders: Secondary | ICD-10-CM

## 2022-06-18 MED ORDER — OLMESARTAN MEDOXOMIL 40 MG PO TABS
40.0000 mg | ORAL_TABLET | Freq: Every day | ORAL | 2 refills | Status: DC
Start: 2022-06-18 — End: 2022-09-15

## 2022-06-19 ENCOUNTER — Other Ambulatory Visit: Payer: Self-pay

## 2022-06-19 DIAGNOSIS — E875 Hyperkalemia: Secondary | ICD-10-CM

## 2022-06-27 ENCOUNTER — Ambulatory Visit (INDEPENDENT_AMBULATORY_CARE_PROVIDER_SITE_OTHER): Payer: Commercial Managed Care - PPO | Admitting: Internal Medicine

## 2022-06-27 ENCOUNTER — Encounter: Payer: Self-pay | Admitting: Internal Medicine

## 2022-06-27 VITALS — BP 127/74 | HR 80 | Ht 69.0 in | Wt 180.4 lb

## 2022-06-27 DIAGNOSIS — R21 Rash and other nonspecific skin eruption: Secondary | ICD-10-CM

## 2022-06-27 MED ORDER — TRIAMCINOLONE ACETONIDE 0.1 % EX CREA
1.0000 | TOPICAL_CREAM | Freq: Two times a day (BID) | CUTANEOUS | 1 refills | Status: DC
Start: 2022-06-27 — End: 2023-07-17

## 2022-06-27 MED ORDER — ESOMEPRAZOLE MAGNESIUM 40 MG PO CPDR: 40.0000 mg | DELAYED_RELEASE_CAPSULE | Freq: Two times a day (BID) | ORAL | 3 refills | Status: DC

## 2022-06-27 NOTE — Assessment & Plan Note (Signed)
Nonblanching, petechial-like rashes present on the right shin and the left shin to a lesser degree.  Present x 10 days.  Antihypertensive medications have recently been titrated but there have otherwise been no significant preceding events. -Triamcinolone 0.1% cream prescribed today for twice daily application -Patient return to care for further workup if the rash does not resolve

## 2022-06-27 NOTE — Patient Instructions (Signed)
It was a pleasure to see you today.  Thank you for giving [place] the opportunity to be involved in your care.  Below is a brief recap of your visit and next steps.  We will plan to see you again in August.  Summary Triamcinolone cream prescribed today Return to care if rash is not improving over the next week

## 2022-06-27 NOTE — Progress Notes (Signed)
   Acute Office Visit  Subjective:     Patient ID: [name], male    DOB: [birth year], 57 y.o.   MRN: [id number]  Chief Complaint  Patient presents with   Rash    Rash appeared after starting new bp meds on 06/16/2022. Started on right leg now on left leg to around sock line.   [name] presents today for an acute visit for evaluation of a rash on his right leg.  He reports noticing a rash on his right shin approximately 10 days ago.  There was nothing unusual about his activity in the days preceding onset of the rash.  The rash is not painful and does not itch.  He has not tried applying any topical treatments on the rash.  He has noted that the rash has progressed in size and has also spread to the left leg.  He has not experienced any associated symptoms such as arthralgias, bleeding, increased fatigue, or fever/chills.  The rash does not involve his palms or soles.  Review of Systems  Skin:  Positive for rash.      Objective:    BP 127/74   Pulse 80   Ht 5\' 9"  (1.753 m)   Wt 180 lb 6.4 oz (81.8 kg)   SpO2 96%   BMI 26.64 kg/m   Physical Exam Skin:    Findings: Rash (Nonblanching, petechial like rash present on right shin and to a lesser degree on the left shin.  Palms and soles are spared.) present.       Assessment & Plan:   Problem List Items Addressed This Visit       Rash in adult - Primary    Nonblanching, petechial-like rashes present on the right shin and the left shin to a lesser degree.  Present x 10 days.  Antihypertensive medications have recently been titrated but there have otherwise been no significant preceding events. -Triamcinolone 0.1% cream prescribed today for twice daily application -Patient return to care for further workup if the rash does not resolve       Meds ordered this encounter  Medications   esomeprazole (NEXIUM) 40 MG capsule    Sig: Take 1 capsule (40 mg total) by mouth 2 (two) times daily before a meal.    Dispense:  90  capsule    Refill:  3   triamcinolone cream (KENALOG) 0.1 %    Sig: Apply 1 Application topically 2 (two) times daily.    Dispense:  15 g    Refill:  1    Return if symptoms worsen or fail to improve.  Billie Lade, MD

## 2022-07-16 ENCOUNTER — Encounter: Payer: Self-pay | Admitting: Nurse Practitioner

## 2022-07-24 ENCOUNTER — Encounter: Payer: Self-pay | Admitting: Internal Medicine

## 2022-07-27 ENCOUNTER — Encounter: Payer: Self-pay | Admitting: Nurse Practitioner

## 2022-07-27 ENCOUNTER — Ambulatory Visit: Payer: Commercial Managed Care - PPO | Admitting: Nurse Practitioner

## 2022-07-27 VITALS — BP 138/88 | HR 83 | Ht 69.0 in | Wt 176.8 lb

## 2022-07-27 DIAGNOSIS — I10 Essential (primary) hypertension: Secondary | ICD-10-CM | POA: Diagnosis not present

## 2022-07-27 DIAGNOSIS — E109 Type 1 diabetes mellitus without complications: Secondary | ICD-10-CM

## 2022-07-27 DIAGNOSIS — Z794 Long term (current) use of insulin: Secondary | ICD-10-CM

## 2022-07-27 DIAGNOSIS — E782 Mixed hyperlipidemia: Secondary | ICD-10-CM | POA: Diagnosis not present

## 2022-07-27 DIAGNOSIS — E559 Vitamin D deficiency, unspecified: Secondary | ICD-10-CM

## 2022-07-27 LAB — POCT UA - MICROALBUMIN
Albumin/Creatinine Ratio, Urine, POC: >300
Creatinine, POC: 50 mg/dL
Microalbumin Ur, POC: 150 mg/L

## 2022-07-27 LAB — POCT GLYCOSYLATED HEMOGLOBIN (HGB A1C): Hemoglobin A1C: 7.9 % — AB (ref 4.0–5.6)

## 2022-07-27 MED ORDER — INSULIN LISPRO 100 UNIT/ML IJ SOLN: INTRAMUSCULAR | 3 refills | Status: DC

## 2022-07-27 NOTE — Progress Notes (Signed)
Endocrinology Follow Up Note       07/27/2022, 9:08 AM   Subjective:    Patient ID: [name], male    DOB: 1965-12-28.  [name] is being seen in follow up after being seen in consultation for management of currently uncontrolled symptomatic diabetes requested by  Billie Lade, MD.   Past Medical History:  Diagnosis Date   Allergy    Anemia    Diabetes mellitus without complication (HCC)    type 1 per patient   GERD (gastroesophageal reflux disease)    HLD (hyperlipidemia)    Hypertension     Past Surgical History:  Procedure Laterality Date   ANKLE SURGERY Right    LUMBAR LAMINECTOMY/DECOMPRESSION MICRODISCECTOMY N/A 10/07/2021   Procedure: Microlumbar decompression L4-5, possible L5-S1;  Surgeon: Jene Every, MD;  Location: [facility] OR;  Service: Orthopedics;  Laterality: N/A;   UPPER GASTROINTESTINAL ENDOSCOPY     VASECTOMY  1994    Social History   Socioeconomic History   Marital status: Married    Spouse name: Not on file   Number of children: 1   Years of education: Not on file   Highest education level: Not on file  Occupational History   Occupation: Holiday representative  Tobacco Use   Smoking status: Every Day    Current packs/day: 1.00    Average packs/day: 1 pack/day for 30.0 years (30.0 ttl pk-yrs)    Types: Cigarettes   Smokeless tobacco: Never  Vaping Use   Vaping status: Never Used  Substance and Sexual Activity   Alcohol use: Not Currently    Alcohol/week: 0.0 - 8.0 standard drinks of alcohol   Drug use: No   Sexual activity: Yes  Other Topics Concern   Not on file  Social History Narrative   Not on file   Social Determinants of Health   Financial Resource Strain: Not on file  Food Insecurity: Not on file  Transportation Needs: Not on file  Physical Activity: Not on file  Stress: Not on file  Social Connections: Not on file    Family History  Problem Relation  Age of Onset   COPD Mother    Diabetes Mother    Early death Father 56       MI   Heart attack Father    Lymphoma Sister 56   Hypertension Brother    Diabetes Maternal Grandmother    Stroke Neg Hx    Alcohol abuse Neg Hx    Drug abuse Neg Hx    Hyperlipidemia Neg Hx    Kidney disease Neg Hx    Colon cancer Neg Hx    Esophageal cancer Neg Hx    Stomach cancer Neg Hx    Rectal cancer Neg Hx     Outpatient Encounter Medications as of 07/27/2022  Medication Sig   amLODipine (NORVASC) 5 MG tablet Take 1 tablet (5 mg total) by mouth daily.   chlorthalidone (HYGROTON) 25 MG tablet TAKE 1 TABLET(25 MG) BY MOUTH DAILY   Continuous Blood Gluc Sensor (DEXCOM G6 SENSOR) MISC Change sensor every 10 days as directed   Continuous Blood Gluc Transmit (DEXCOM G6 TRANSMITTER) MISC Change transmitter every 90 days as  directed.   esomeprazole (NEXIUM) 40 MG capsule Take 1 capsule (40 mg total) by mouth 2 (two) times daily before a meal.   Evolocumab (REPATHA) 140 MG/ML SOSY Inject 140 mg into the skin every 14 (fourteen) days.   gabapentin (NEURONTIN) 300 MG capsule Take 300 mg by mouth 3 (three) times daily.   Insulin Disposable Pump (OMNIPOD 5 G6 INTRO, GEN 5,) KIT Change pod every 48-72 hrs   Insulin Disposable Pump (OMNIPOD 5 G6 POD, GEN 5,) MISC Change pod every 48-72 hrs   olmesartan (BENICAR) 40 MG tablet Take 1 tablet (40 mg total) by mouth daily.   triamcinolone cream (KENALOG) 0.1 % Apply 1 Application topically 2 (two) times daily.   [DISCONTINUED] insulin lispro (HUMALOG) 100 UNIT/ML injection Use with Omnipod for TDD around 45 units per day   Blood Pressure KIT Check your blood pressure 2-3 times weekly and record readings to bring to your next appointment   ferrous sulfate 325 (65 FE) MG tablet Take 650 mg by mouth daily.   insulin lispro (HUMALOG) 100 UNIT/ML injection Use with Omnipod for TDD around 50 units per day   No facility-administered encounter medications on file as of  07/27/2022.    ALLERGIES: Allergies  Allergen Reactions   Crestor [Rosuvastatin Calcium] Other (See Comments)    Muscle aches   Iron Other (See Comments)    "Tore up my stomach"  "Liquid Iron"   Lipitor [Atorvastatin Calcium] Other (See Comments)    Muscles aches   Statins Other (See Comments)    Muscle aches    VACCINATION STATUS: Immunization History  Administered Date(s) Administered   Influenza,inj,Quad PF,56 Mos 01/18/2016, 10/17/2017, 11/06/2019, 10/28/2020   Influenza,inj,quad, With Preservative 10/30/2018   Influenza-Unspecified 10/17/2017, 10/10/2021   PFIZER(Purple Top)SARS-COV-2 Vaccination 04/10/2019, 05/05/2019   Pneumococcal Polysaccharide-23 10/21/2014   Tdap 06/02/2013    Diabetes He presents for his follow-up diabetic visit. He has type 1 diabetes mellitus. Onset time: Diagnosed with LADA at age of 23. His disease course has been stable. There are no hypoglycemic associated symptoms. Associated symptoms include blurred vision and fatigue. There are no hypoglycemic complications. Symptoms are stable. There are no diabetic complications. Risk factors for coronary artery disease include diabetes mellitus, dyslipidemia, family history, male sex and tobacco exposure. Current diabetic treatment includes insulin pump. He is compliant with treatment most of the time. His weight is fluctuating minimally. He is following a generally healthy diet. When asked about meal planning, he reported none. He has not had a previous visit with a dietitian. He participates in exercise daily. His home blood glucose trend is fluctuating minimally. His overall blood glucose range is 180-200 mg/dl. (He presents today with his CGM and pump showing stable glycemic profile with at target fasting readings and above target postprandial readings.  His POCT A1c today is 7.9%, increasing slightly from last visit of 7.7%.  He admits he forgets to bolus sometimes before eating.  Analysis of his CGM shows TIR  50%, TAR 50%, TBR 0% with a GMI of 7.9%.  He did have trouble getting his Humalog due to backorder and had to stretch out his insulin, limiting his boluses.) An ACE inhibitor/angiotensin II receptor blocker is being taken. He does not see a podiatrist.Eye exam is not current.    Review of systems  Constitutional: + Minimally fluctuating body weight,  current Body mass index is 26.11 kg/m. , no fatigue, no subjective hyperthermia, no subjective hypothermia Eyes: no blurry vision, no xerophthalmia ENT: no sore throat, no  nodules palpated in throat, no dysphagia/odynophagia, no hoarseness Cardiovascular: no chest pain, no shortness of breath, no palpitations, no leg swelling Respiratory: no cough, no shortness of breath Gastrointestinal: no nausea/vomiting/diarrhea Musculoskeletal: no muscle/joint aches Skin: no rashes, no hyperemia Neurological: no tremors, no numbness, no tingling, no dizziness Psychiatric: no depression, no anxiety  Objective:     BP 138/88 (BP Location: Left Arm, Patient Position: Sitting, Cuff Size: Large)   Pulse 83   Ht 5\' 9"  (1.753 m)   Wt 176 lb 12.8 oz (80.2 kg)   BMI 26.11 kg/m   Wt Readings from Last 3 Encounters:  07/27/22 176 lb 12.8 oz (80.2 kg)  06/27/22 180 lb 6.4 oz (81.8 kg)  06/16/22 177 lb 3.2 oz (80.4 kg)     BP Readings from Last 3 Encounters:  07/27/22 138/88  06/27/22 127/74  06/16/22 (!) 147/85     Physical Exam- Limited  Constitutional:  Body mass index is 26.11 kg/m. , not in acute distress, normal state of mind Eyes:  EOMI, no exophthalmos Musculoskeletal: no gross deformities, strength intact in all four extremities, no gross restriction of joint movements Skin:  no rashes, no hyperemia Neurological: no tremor with outstretched hands   Diabetic Foot Exam - Simple   Simple Foot Form Diabetic Foot exam was performed with the following findings: Yes 07/27/2022  8:28 AM  Visual Inspection See comments: Yes Sensation  Testing Intact to touch and monofilament testing bilaterally: Yes Pulse Check Posterior Tibialis and Dorsalis pulse intact bilaterally: Yes Comments Onychomycosis bilaterally     CMP ( most recent) CMP     Component Value Date/Time   NA 125 (L) 06/16/2022 0859   K 4.8 06/16/2022 0859   CL 86 (L) 06/16/2022 0859   CO2 24 06/16/2022 0859   GLUCOSE 213 (H) 06/16/2022 0859   GLUCOSE 310 (H) 10/08/2021 1024   BUN 13 06/16/2022 0859   CREATININE 1.08 06/16/2022 0859   CALCIUM 9.1 06/16/2022 0859   PROT 6.4 03/24/2022 1014   ALBUMIN 3.7 (L) 03/24/2022 1014   AST 37 03/24/2022 1014   ALT 27 03/24/2022 1014   ALKPHOS 143 (H) 03/24/2022 1014   BILITOT 0.2 03/24/2022 1014   GFRNONAA >60 10/08/2021 1024   GFRAA >60 10/10/2017 0542     Diabetic Labs (most recent): Lab Results  Component Value Date   HGBA1C 7.9 (A) 07/27/2022   HGBA1C 7.7 (A) 03/27/2022   HGBA1C 7.4 (A) 12/22/2021   MICROALBUR 150 mg/mL 07/27/2022   MICROALBUR 150 06/20/2021   MICROALBUR 17.0 (H) 01/18/2016     Lipid Panel ( most recent) Lipid Panel     Component Value Date/Time   CHOL 230 (H) 02/21/2022 0918   TRIG 61 02/21/2022 0918   HDL 103 02/21/2022 0918   CHOLHDL 2.2 02/21/2022 0918   CHOLHDL 3 10/28/2020 0943   VLDL 15.4 10/28/2020 0943   LDLCALC 117 (H) 02/21/2022 0918   LDLDIRECT 156.0 01/18/2016 1020   LABVLDL 10 02/21/2022 0918      Lab Results  Component Value Date   TSH 2.010 02/21/2022   TSH 1.78 02/02/2021   TSH 1.65 10/28/2020   TSH 2.15 01/18/2016   FREET4 1.03 02/21/2022           Assessment & Plan:   1) Type 1 diabetes mellitus without complication (HCC)  He presents today with his CGM and pump showing stable glycemic profile with at target fasting readings and above target postprandial readings.  His POCT A1c today is 7.9%, increasing  slightly from last visit of 7.7%.  He admits he forgets to bolus sometimes before eating.  Analysis of his CGM shows TIR 50%, TAR 50%,  TBR 0% with a GMI of 7.9%.  He did have trouble getting his Humalog due to backorder and had to stretch out his insulin, limiting his boluses.  - ROYAL VANDEVOORT has currently uncontrolled symptomatic LADA DM since 57 years of age.   -Recent labs reviewed.  POCT UM today was elevated but recent CMP shows normal kidney function.  Will recheck urine micro next visit to assess for proteinuria.  - I had a long discussion with him about the progressive nature of diabetes and the pathology behind its complications. -his diabetes is complicated by drastic fluctuations and severe hypoglycemia and he remains at a high risk for more acute and chronic complications which include CAD, CVA, CKD, retinopathy, and neuropathy. These are all discussed in detail with him.  The following Lifestyle Medicine recommendations according to American College of Lifestyle Medicine [facility]) were discussed and offered to patient and he agrees to start the journey:  A. Whole Foods, Plant-based plate comprising of fruits and vegetables, plant-based proteins, whole-grain carbohydrates was discussed in detail with the patient.   A list for source of those nutrients were also provided to the patient.  Patient will use only water or unsweetened tea for hydration. B.  The need to stay away from risky substances including alcohol, smoking; obtaining 7 to 9 hours of restorative sleep, at least 150 minutes of moderate intensity exercise weekly, the importance of healthy social connections,  and stress reduction techniques were discussed. C.  A full color page of  Calorie density of various food groups per pound showing examples of each food groups was provided to the patient.  - Nutritional counseling repeated at each appointment due to patients tendency to fall back in to old habits.  - The patient admits there is a room for improvement in their diet and drink choices. -  Suggestion is made for the patient to avoid simple carbohydrates from  their diet including Cakes, Sweet Desserts / Pastries, Ice Cream, Soda (diet and regular), Sweet Tea, Candies, Chips, Cookies, Sweet Pastries, Store Bought Juices, Alcohol in Excess of 1-2 drinks a day, Artificial Sweeteners, Coffee Creamer, and "Sugar-free" Products. This will help patient to have stable blood glucose profile and potentially avoid unintended weight gain.   - I encouraged the patient to switch to unprocessed or minimally processed complex starch and increased protein intake (animal or plant source), fruits, and vegetables.   - Patient is advised to stick to a routine mealtimes to eat 3 meals a day and avoid unnecessary snacks (to snack only to correct hypoglycemia).  - I have approached him with the following individualized plan to manage his diabetes and patient agrees:   -Given stable glycemic profile and extenuating circumstances that caused him to stretch out his insulin, no changes will be made to his settings today.  We also discussed the importance of bolusing before meals, which he will work on trying to do more consistently.  -he is encouraged to continue monitoring glucose 4 times daily (using his CGM), before meals and before bed, and to call the clinic if he has readings less than 70 or above 300 for 3 tests in a row.    - he is warned not to take insulin without proper monitoring per orders. - Adjustment parameters are given to him for hypo and hyperglycemia in writing.  -  his Metformin was discontinued, risk outweighs benefit for this patient, given his type 1 diagnosis where insulin treatment is necessary.  - Specific targets for  A1c; LDL, HDL, and Triglycerides were discussed with the patient.  2) Blood Pressure /Hypertension:  his blood pressure is controlled to target but is improving.   he is advised to continue his current medications including Norvasc 5 mg po daily, Chlorthalidone 25 mg po daily and Olmesartan 40 mg po daily.  3) Lipids/Hyperlipidemia:     Review of his recent lipid panel from 02/21/22 showed uncontrolled LDL at 117 (improving) . He is allergic to statin medications.  He is advised to avoid fried foods and butter.  4)  Weight/Diet:  his Body mass index is 26.11 kg/m.  -    he is NOT a candidate for weight loss.   Exercise, and detailed carbohydrates information provided  -  detailed on discharge instructions.  5) Chronic Care/Health Maintenance: -he is on ACEI/ARB and not on Statin medications and is encouraged to initiate and continue to follow up with Ophthalmology, Dentist, Podiatrist at least yearly or according to recommendations, and advised to QUIT SMOKING. I have recommended yearly flu vaccine and pneumonia vaccine at least every 5 years; moderate intensity exercise for up to 150 minutes weekly; and sleep for at least 7 hours a day.  - he is advised to maintain close follow up with Durwin Nora, Lucina Mellow, MD for primary care needs, as well as his other providers for optimal and coordinated care.     I spent  41  minutes in the care of the patient today including review of labs from CMP, Lipids, Thyroid Function, Hematology (current and previous including abstractions from other facilities); face-to-face time discussing  his blood glucose readings/logs, discussing hypoglycemia and hyperglycemia episodes and symptoms, medications doses, his options of short and long term treatment based on the latest standards of care / guidelines;  discussion about incorporating lifestyle medicine;  and documenting the encounter. Risk reduction counseling performed per USPSTF guidelines to reduce obesity and cardiovascular risk factors.     Please refer to Patient Instructions for Blood Glucose Monitoring and Insulin/Medications Dosing Guide"  in media tab for additional information. Please  also refer to " Patient Self Inventory" in the Media  tab for reviewed elements of pertinent patient history.  [name] participated in the discussions,  expressed understanding, and voiced agreement with the above plans.  All questions were answered to his satisfaction. he is encouraged to contact clinic should he have any questions or concerns prior to his return visit.     Follow up plan: - Return in about 4 months (around 11/27/2022) for Bring meter and logs, Diabetes F/U- A1c and UM in office, No previsit labs.   Ronny Bacon, [facility] [address] [place], [place] [address] Phone: [phone] Fax: [phone]  07/27/2022, 9:08 AM

## 2022-08-06 ENCOUNTER — Other Ambulatory Visit: Payer: Self-pay | Admitting: Internal Medicine

## 2022-08-06 DIAGNOSIS — Z789 Other specified health status: Secondary | ICD-10-CM

## 2022-08-06 DIAGNOSIS — E1069 Type 1 diabetes mellitus with other specified complication: Secondary | ICD-10-CM

## 2022-08-28 ENCOUNTER — Encounter: Payer: Self-pay | Admitting: Nurse Practitioner

## 2022-09-01 ENCOUNTER — Other Ambulatory Visit: Payer: Self-pay | Admitting: Internal Medicine

## 2022-09-01 ENCOUNTER — Other Ambulatory Visit: Payer: Self-pay | Admitting: Nurse Practitioner

## 2022-09-01 DIAGNOSIS — E1159 Type 2 diabetes mellitus with other circulatory complications: Secondary | ICD-10-CM

## 2022-09-01 MED ORDER — OMNIPOD 5 DEXG7G6 PODS GEN 5 MISC: 6 refills | Status: DC

## 2022-09-14 ENCOUNTER — Other Ambulatory Visit: Payer: Self-pay | Admitting: Internal Medicine

## 2022-09-14 DIAGNOSIS — E1069 Type 1 diabetes mellitus with other specified complication: Secondary | ICD-10-CM

## 2022-09-14 DIAGNOSIS — Z789 Other specified health status: Secondary | ICD-10-CM

## 2022-09-15 ENCOUNTER — Encounter: Payer: Self-pay | Admitting: Internal Medicine

## 2022-09-15 ENCOUNTER — Other Ambulatory Visit: Payer: Self-pay

## 2022-09-15 ENCOUNTER — Telehealth: Payer: Self-pay | Admitting: Internal Medicine

## 2022-09-15 ENCOUNTER — Ambulatory Visit: Payer: Commercial Managed Care - PPO | Admitting: Internal Medicine

## 2022-09-15 DIAGNOSIS — I152 Hypertension secondary to endocrine disorders: Secondary | ICD-10-CM

## 2022-09-15 MED ORDER — OLMESARTAN MEDOXOMIL 40 MG PO TABS
40.0000 mg | ORAL_TABLET | Freq: Every day | ORAL | 2 refills | Status: DC
Start: 2022-09-15 — End: 2022-12-07

## 2022-09-15 NOTE — Telephone Encounter (Signed)
Prescription Request  09/15/2022  LOV: 06/27/2022  What is the name of the medication or equipment? olmesartan (BENICAR) 40 MG tablet [[id number]]    Have you contacted your pharmacy to request a refill? Yes   Which pharmacy would you like this sent to?  [facility] [phone] - [place], [place] - [address] AT [facility] [address] [place] [address] Phone: [phone] Fax: [phone]    Patient notified that their request is being sent to the clinical staff for review and that they should receive a response within 2 business days.   Please advise at Mobile [phone] (mobile)

## 2022-09-15 NOTE — Telephone Encounter (Signed)
Refill sent.

## 2022-10-03 ENCOUNTER — Other Ambulatory Visit: Payer: Self-pay | Admitting: Nurse Practitioner

## 2022-10-03 MED ORDER — INSULIN LISPRO 100 UNIT/ML IJ SOLN: INTRAMUSCULAR | 3 refills | Status: DC

## 2022-10-03 MED ORDER — OMNIPOD 5 DEXG7G6 PODS GEN 5 MISC: 6 refills | Status: DC

## 2022-10-03 NOTE — Addendum Note (Signed)
Addended by: Dani Gobble on: 10/03/2022 03:27 PM   Modules accepted: Orders

## 2022-10-04 NOTE — Telephone Encounter (Signed)
Refills were sent by endocrinologist

## 2022-10-13 ENCOUNTER — Encounter: Payer: Self-pay | Admitting: Internal Medicine

## 2022-10-13 ENCOUNTER — Ambulatory Visit: Payer: Commercial Managed Care - PPO | Admitting: Internal Medicine

## 2022-10-18 ENCOUNTER — Encounter: Payer: Self-pay | Admitting: Internal Medicine

## 2022-10-18 ENCOUNTER — Ambulatory Visit: Payer: Commercial Managed Care - PPO | Admitting: Internal Medicine

## 2022-10-18 VITALS — BP 130/74 | HR 82 | Ht 69.0 in | Wt 183.6 lb

## 2022-10-18 DIAGNOSIS — Z1211 Encounter for screening for malignant neoplasm of colon: Secondary | ICD-10-CM | POA: Insufficient documentation

## 2022-10-18 DIAGNOSIS — K219 Gastro-esophageal reflux disease without esophagitis: Secondary | ICD-10-CM

## 2022-10-18 DIAGNOSIS — E785 Hyperlipidemia, unspecified: Secondary | ICD-10-CM

## 2022-10-18 DIAGNOSIS — E1159 Type 2 diabetes mellitus with other circulatory complications: Secondary | ICD-10-CM | POA: Diagnosis not present

## 2022-10-18 DIAGNOSIS — Z23 Encounter for immunization: Secondary | ICD-10-CM | POA: Diagnosis not present

## 2022-10-18 DIAGNOSIS — E1069 Type 1 diabetes mellitus with other specified complication: Secondary | ICD-10-CM

## 2022-10-18 DIAGNOSIS — E871 Hypo-osmolality and hyponatremia: Secondary | ICD-10-CM | POA: Diagnosis not present

## 2022-10-18 DIAGNOSIS — I152 Hypertension secondary to endocrine disorders: Secondary | ICD-10-CM

## 2022-10-18 DIAGNOSIS — E1065 Type 1 diabetes mellitus with hyperglycemia: Secondary | ICD-10-CM

## 2022-10-18 DIAGNOSIS — Z720 Tobacco use: Secondary | ICD-10-CM

## 2022-10-18 DIAGNOSIS — H9193 Unspecified hearing loss, bilateral: Secondary | ICD-10-CM | POA: Diagnosis not present

## 2022-10-18 NOTE — Assessment & Plan Note (Signed)
Currently smokes 1 pack/day of cigarettes.  He has intermittently quit over the last year.  He has previously tried nicotine replacement therapy without sustained success. -The patient was counseled on the dangers of tobacco use, and was advised to quit.  Reviewed strategies to maximize success, including removing cigarettes and smoking materials from environment, stress management, substitution of other forms of reinforcement, support of family/friends, and written materials. -He remains interested in lung cancer screening.  We will message the lung cancer screening program today.

## 2022-10-18 NOTE — Assessment & Plan Note (Signed)
Adequately controlled on current antihypertensive regimen consisting of olmesartan 20 mg daily, amlodipine 5 mg daily, and amlodipine 25 mg daily.  No medication changes are indicated today.

## 2022-10-18 NOTE — Assessment & Plan Note (Signed)
Underwent colonoscopy in March 2018.  Repeat colonoscopy recommended for 5 years.  Gastroenterology referral placed today for repeat colonoscopy.

## 2022-10-18 NOTE — Progress Notes (Signed)
Established Patient Office Visit  Subjective   Patient ID: [name], male    DOB: [birth year]  Age: 57 y.o. MRN: [id number]  Chief Complaint  Patient presents with   Follow-up    Follow up hearing getting worse   [name] returns to care today for routine follow-up.  He was last evaluated by me on 6/11 for an acute visit for evaluation of a lower extremity rash.  In the interim, he has been evaluated by endocrinology for follow-up.  There have otherwise been no acute interval events.  Previously seen by me for routine follow-up on 5/31.  No medication changes were made at that time.  [name] endorses bilateral hearing loss that has worsened over the last 6 months.  He states that he is having to turn the volume up higher when watching television and also states that he no longer hears cars passing his house.  He would like to have this further evaluated.  He is otherwise asymptomatic and has no acute concerns to discuss.  Past Medical History:  Diagnosis Date   Allergy    Anemia    Diabetes mellitus without complication (HCC)    type 1 per patient   GERD (gastroesophageal reflux disease)    HLD (hyperlipidemia)    Hypertension    Past Surgical History:  Procedure Laterality Date   ANKLE SURGERY Right    LUMBAR LAMINECTOMY/DECOMPRESSION MICRODISCECTOMY N/A 10/07/2021   Procedure: Microlumbar decompression L4-5, possible L5-S1;  Surgeon: Jene Every, MD;  Location: [facility] OR;  Service: Orthopedics;  Laterality: N/A;   UPPER GASTROINTESTINAL ENDOSCOPY     VASECTOMY  1994   Social History   Tobacco Use   Smoking status: Every Day    Current packs/day: 1.00    Average packs/day: 1 pack/day for 30.0 years (30.0 ttl pk-yrs)    Types: Cigarettes   Smokeless tobacco: Never  Vaping Use   Vaping status: Never Used  Substance Use Topics   Alcohol use: Not Currently    Alcohol/week: 0.0 - 8.0 standard drinks of alcohol   Drug use: No   Family History  Problem Relation Age of  Onset   COPD Mother    Diabetes Mother    Early death Father 46       MI   Heart attack Father    Lymphoma Sister 55   Hypertension Brother    Diabetes Maternal Grandmother    Stroke Neg Hx    Alcohol abuse Neg Hx    Drug abuse Neg Hx    Hyperlipidemia Neg Hx    Kidney disease Neg Hx    Colon cancer Neg Hx    Esophageal cancer Neg Hx    Stomach cancer Neg Hx    Rectal cancer Neg Hx    Allergies  Allergen Reactions   Crestor [Rosuvastatin Calcium] Other (See Comments)    Muscle aches   Iron Other (See Comments)    "Tore up my stomach"  "Liquid Iron"   Lipitor [Atorvastatin Calcium] Other (See Comments)    Muscles aches   Statins Other (See Comments)    Muscle aches   Review of Systems  Constitutional:  Negative for chills and fever.  HENT:  Positive for hearing loss (bilateral). Negative for sore throat.   Respiratory:  Negative for cough and shortness of breath.   Cardiovascular:  Negative for chest pain, palpitations and leg swelling.  Gastrointestinal:  Negative for abdominal pain, blood in stool, constipation, diarrhea, nausea and vomiting.  Genitourinary:  Negative for dysuria and hematuria.  Musculoskeletal:  Negative for myalgias.  Skin:  Negative for itching and rash.  Neurological:  Negative for dizziness and headaches.  Psychiatric/Behavioral:  Negative for depression and suicidal ideas.      Objective:     BP 130/74 (BP Location: Left Arm, Patient Position: Sitting, Cuff Size: Large)   Pulse 82   Ht 5\' 9"  (1.753 m)   Wt 183 lb 9.6 oz (83.3 kg)   SpO2 98%   BMI 27.11 kg/m  BP Readings from Last 3 Encounters:  10/18/22 130/74  07/27/22 138/88  06/27/22 127/74   Physical Exam Vitals reviewed.  Constitutional:      General: He is not in acute distress.    Appearance: Normal appearance. He is not ill-appearing.  HENT:     Head: Normocephalic and atraumatic.     Right Ear: External ear normal.     Left Ear: External ear normal.     Nose: Nose  normal. No congestion or rhinorrhea.     Mouth/Throat:     Mouth: Mucous membranes are moist.     Pharynx: Oropharynx is clear.  Eyes:     General: No scleral icterus.    Extraocular Movements: Extraocular movements intact.     Conjunctiva/sclera: Conjunctivae normal.     Pupils: Pupils are equal, round, and reactive to light.  Cardiovascular:     Rate and Rhythm: Normal rate and regular rhythm.     Pulses: Normal pulses.     Heart sounds: Normal heart sounds. No murmur heard. Pulmonary:     Effort: Pulmonary effort is normal.     Breath sounds: Normal breath sounds. No wheezing, rhonchi or rales.  Abdominal:     General: Abdomen is flat. Bowel sounds are normal. There is no distension.     Palpations: Abdomen is soft.     Tenderness: There is no abdominal tenderness.  Musculoskeletal:        General: No swelling or deformity. Normal range of motion.     Cervical back: Normal range of motion.  Skin:    General: Skin is warm and dry.     Capillary Refill: Capillary refill takes less than 2 seconds.  Neurological:     General: No focal deficit present.     Mental Status: He is alert and oriented to person, place, and time.     Sensory: Sensory deficit (Bilateral hearing loss) present.     Motor: No weakness.  Psychiatric:        Mood and Affect: Mood normal.        Behavior: Behavior normal.        Thought Content: Thought content normal.   Last CBC Lab Results  Component Value Date   WBC 8.2 02/21/2022   HGB 13.7 02/21/2022   HCT 40.2 02/21/2022   MCV 96 02/21/2022   MCH 32.6 02/21/2022   RDW 11.9 02/21/2022   PLT 361 02/21/2022   Last metabolic panel Lab Results  Component Value Date   GLUCOSE 213 (H) 06/16/2022   NA 125 (L) 06/16/2022   K 4.8 06/16/2022   CL 86 (L) 06/16/2022   CO2 24 06/16/2022   BUN 13 06/16/2022   CREATININE 1.08 06/16/2022   EGFR 81 06/16/2022   CALCIUM 9.1 06/16/2022   PROT 6.4 03/24/2022   ALBUMIN 3.7 (L) 03/24/2022   LABGLOB 2.7  03/24/2022   AGRATIO 1.4 03/24/2022   BILITOT 0.2 03/24/2022   ALKPHOS 143 (H) 03/24/2022   AST 37  03/24/2022   ALT 27 03/24/2022   ANIONGAP 8 10/08/2021   Last lipids Lab Results  Component Value Date   CHOL 230 (H) 02/21/2022   HDL 103 02/21/2022   LDLCALC 117 (H) 02/21/2022   LDLDIRECT 156.0 01/18/2016   TRIG 61 02/21/2022   CHOLHDL 2.2 02/21/2022   Last hemoglobin A1c Lab Results  Component Value Date   HGBA1C 7.9 (A) 07/27/2022   Last thyroid functions Lab Results  Component Value Date   TSH 2.010 02/21/2022   Last vitamin D Lab Results  Component Value Date   VD25OH 39.9 05/19/2022   Last vitamin B12 and Folate Lab Results  Component Value Date   VITAMINB12 568 02/21/2022   FOLATE >20.0 02/21/2022     Assessment & Plan:   Problem List Items Addressed This Visit       Hypertension associated with diabetes (HCC) (Chronic)    Adequately controlled on current antihypertensive regimen consisting of olmesartan 20 mg daily, amlodipine 5 mg daily, and amlodipine 25 mg daily.  No medication changes are indicated today.      Gastroesophageal reflux disease without esophagitis    Symptoms remain well-controlled with Nexium.  No medication changes are indicated today.      Diabetes mellitus type 1 (HCC)    Followed by endocrinology.  A1c 7.9 in July.  Endocrinology follow-up is scheduled for 11/11.      Hyperlipidemia due to T1DM [facility]) - Statin Intolerant    Lipid panel updated in February.  Total cholesterol 230 and LDL 117.  He is currently prescribed Repatha in the setting of statin intolerance. -Repeat lipid panel ordered today      Bilateral hearing loss    Audiology referral placed today for further evaluation      Tobacco use    Currently smokes 1 pack/day of cigarettes.  He has intermittently quit over the last year.  He has previously tried nicotine replacement therapy without sustained success. -The patient was counseled on the dangers of  tobacco use, and was advised to quit.  Reviewed strategies to maximize success, including removing cigarettes and smoking materials from environment, stress management, substitution of other forms of reinforcement, support of family/friends, and written materials. -He remains interested in lung cancer screening.  We will message the lung cancer screening program today.      Colon cancer screening - Primary    Underwent colonoscopy in March 2018.  Repeat colonoscopy recommended for 5 years.  Gastroenterology referral placed today for repeat colonoscopy.      Hyponatremia    Na+ 125 on labs from May.  Repeat BMP ordered today.      Need for influenza vaccination    Influenza vaccine administered today.       Return in about 6 months (around 04/18/2023).    Billie Lade, MD

## 2022-10-18 NOTE — Assessment & Plan Note (Signed)
Followed by endocrinology.  A1c 7.9 in July.  Endocrinology follow-up is scheduled for 11/11.

## 2022-10-18 NOTE — Assessment & Plan Note (Signed)
Influenza vaccine administered today.

## 2022-10-18 NOTE — Assessment & Plan Note (Signed)
Audiology referral placed today for further evaluation

## 2022-10-18 NOTE — Assessment & Plan Note (Signed)
Symptoms remain well-controlled with Nexium.  No medication changes are indicated today.

## 2022-10-18 NOTE — Assessment & Plan Note (Signed)
Na+ 125 on labs from May.  Repeat BMP ordered today.

## 2022-10-18 NOTE — Assessment & Plan Note (Signed)
Lipid panel updated in February.  Total cholesterol 230 and LDL 117.  He is currently prescribed Repatha in the setting of statin intolerance. -Repeat lipid panel ordered today

## 2022-10-18 NOTE — Patient Instructions (Signed)
It was a pleasure to see you today.  Thank you for giving [place] the opportunity to be involved in your care.  Below is a brief recap of your visit and next steps.  We will plan to see you again in 6 months.  Summary No medication changes today Referral placed for repeat colonoscopy We will message the lung cancer screening program Audiology referral for hearing evaluation Flu shot today Follow up in 6 months

## 2022-10-19 ENCOUNTER — Other Ambulatory Visit: Payer: Self-pay | Admitting: Family Medicine

## 2022-10-26 ENCOUNTER — Ambulatory Visit: Payer: Commercial Managed Care - PPO | Attending: Internal Medicine | Admitting: Audiologist

## 2022-10-26 DIAGNOSIS — H903 Sensorineural hearing loss, bilateral: Secondary | ICD-10-CM | POA: Insufficient documentation

## 2022-10-26 NOTE — Procedures (Signed)
  Outpatient Audiology and [facility] [address] [place], [place]  [address] [phone]  AUDIOLOGICAL  EVALUATION  NAME: [name]     DOB:   04-15-1965      MRN: [id number]                                                                                     DATE: 10/26/2022     REFERENT: Billie Lade, MD STATUS: Outpatient DIAGNOSIS: Sensorineural Hearing Loss Bilateral    History: [name] was seen for an audiological evaluation due to significant difficulty hearing. [name]'s wife feels he has a hearing loss. He has started to notice how much he is struggling over the last year. He denies any pain or pressure in either ear. He denies bothersome tinnitus. He works in [employer] and is around Sales promotion account executive daily. He cannot understand people in church or if they are more than a few feet away.   Evaluation:  Otoscopy showed a clear view of the tympanic membranes, bilaterally Tympanometry results were consistent with normal middle ear function, bilaterally   Audiometric testing was completed using Conventional Audiometry techniques with insert earphones and TDH headphones. Test results are consistent with mild sloping to severe sensorineural hearing loss bilaterally. Speech Recognition Thresholds were obtained at 55dB HL in the right ear and at 55dB HL in the left ear. Word Recognition Testing was completed at 90dB HL and [name] scored 80% in the right ear 72% in the left ear.   Results:  The test results were reviewed with [name]. He has a severe high frequency sensorineural hearing loss. Estephan needs hearing aids for both ears. Three copies of his test and a list of local hearing aid providers were given to [name]. He is ready and motivated to try hearing aids.   Recommendations: 1.   Hearing aids necessary for both ears. See list of providers and bring audiogram for hearing aid consult.    32 minutes spent testing and counseling on results.   If you have any questions please  feel free to contact me at [phone].  Ammie Ferrier Audiologist, Au.D., CCC-A 10/26/2022  2:51 PM  Cc: Billie Lade, MD

## 2022-10-30 ENCOUNTER — Ambulatory Visit: Payer: Commercial Managed Care - PPO | Admitting: Internal Medicine

## 2022-10-31 ENCOUNTER — Other Ambulatory Visit: Payer: Self-pay | Admitting: Nurse Practitioner

## 2022-11-20 ENCOUNTER — Other Ambulatory Visit: Payer: Self-pay | Admitting: Nurse Practitioner

## 2022-11-21 ENCOUNTER — Encounter: Payer: Self-pay | Admitting: Nurse Practitioner

## 2022-11-21 ENCOUNTER — Ambulatory Visit (INDEPENDENT_AMBULATORY_CARE_PROVIDER_SITE_OTHER): Payer: Commercial Managed Care - PPO | Admitting: Nurse Practitioner

## 2022-11-21 VITALS — BP 164/93 | HR 63 | Ht 69.0 in | Wt 183.0 lb

## 2022-11-21 DIAGNOSIS — Z794 Long term (current) use of insulin: Secondary | ICD-10-CM

## 2022-11-21 DIAGNOSIS — E109 Type 1 diabetes mellitus without complications: Secondary | ICD-10-CM

## 2022-11-21 DIAGNOSIS — I10 Essential (primary) hypertension: Secondary | ICD-10-CM

## 2022-11-21 DIAGNOSIS — E559 Vitamin D deficiency, unspecified: Secondary | ICD-10-CM | POA: Diagnosis not present

## 2022-11-21 DIAGNOSIS — E782 Mixed hyperlipidemia: Secondary | ICD-10-CM | POA: Diagnosis not present

## 2022-11-21 LAB — POCT GLYCOSYLATED HEMOGLOBIN (HGB A1C): Hemoglobin A1C: 7.7 % — AB (ref 4.0–5.6)

## 2022-11-21 MED ORDER — DEXCOM G6 TRANSMITTER MISC: 3 refills | Status: DC

## 2022-11-21 MED ORDER — DEXCOM G6 SENSOR MISC: 3 refills | Status: DC

## 2022-11-21 MED ORDER — OMNIPOD 5 DEXG7G6 PODS GEN 5 MISC: 6 refills | Status: DC

## 2022-11-21 MED ORDER — INSULIN LISPRO 100 UNIT/ML IJ SOLN: INTRAMUSCULAR | 3 refills | Status: DC

## 2022-11-21 NOTE — Progress Notes (Signed)
Endocrinology Follow Up Note       11/21/2022, 10:03 AM   Subjective:    Patient ID: [name], male    DOB: Aug 02, 1965.  [name] is being seen in follow up after being seen in consultation for management of currently uncontrolled symptomatic diabetes requested by  Billie Lade, MD.   Past Medical History:  Diagnosis Date   Allergy    Anemia    Diabetes mellitus without complication (HCC)    type 1 per patient   GERD (gastroesophageal reflux disease)    HLD (hyperlipidemia)    Hypertension     Past Surgical History:  Procedure Laterality Date   ANKLE SURGERY Right    LUMBAR LAMINECTOMY/DECOMPRESSION MICRODISCECTOMY N/A 10/07/2021   Procedure: Microlumbar decompression L4-5, possible L5-S1;  Surgeon: Jene Every, MD;  Location: [facility] OR;  Service: Orthopedics;  Laterality: N/A;   UPPER GASTROINTESTINAL ENDOSCOPY     VASECTOMY  1994    Social History   Socioeconomic History   Marital status: Married    Spouse name: Not on file   Number of children: 1   Years of education: Not on file   Highest education level: Associate degree: occupational, Scientist, product/process development, or vocational program  Occupational History   Occupation: Holiday representative  Tobacco Use   Smoking status: Every Day    Current packs/day: 1.00    Average packs/day: 1 pack/day for 30.0 years (30.0 ttl pk-yrs)    Types: Cigarettes   Smokeless tobacco: Never  Vaping Use   Vaping status: Never Used  Substance and Sexual Activity   Alcohol use: Not Currently    Alcohol/week: 0.0 - 8.0 standard drinks of alcohol   Drug use: No   Sexual activity: Yes  Other Topics Concern   Not on file  Social History Narrative   Not on file   Social Determinants of Health   Financial Resource Strain: High Risk (09/11/2022)   Overall Financial Resource Strain (CARDIA)    Difficulty of Paying Living Expenses: Hard  Food Insecurity: Patient Declined  (09/11/2022)   Hunger Vital Sign    Worried About Running Out of Food in the Last Year: Patient declined    Ran Out of Food in the Last Year: Patient declined  Transportation Needs: No Transportation Needs (09/11/2022)   PRAPARE - Administrator, Civil Service (Medical): No    Lack of Transportation (Non-Medical): No  Physical Activity: Not on file  Stress: Not on file  Social Connections: Unknown (09/11/2022)   Social Connection and Isolation Panel [NHANES]    Frequency of Communication with Friends and Family: Not on file    Frequency of Social Gatherings with Friends and Family: Not on file    Attends Religious Services: Not on file    Active Member of Clubs or Organizations: No    Attends Banker Meetings: Not on file    Marital Status: Married    Family History  Problem Relation Age of Onset   COPD Mother    Diabetes Mother    Early death Father 97       MI   Heart attack Father    Lymphoma Sister 71  Hypertension Brother    Diabetes Maternal Grandmother    Stroke Neg Hx    Alcohol abuse Neg Hx    Drug abuse Neg Hx    Hyperlipidemia Neg Hx    Kidney disease Neg Hx    Colon cancer Neg Hx    Esophageal cancer Neg Hx    Stomach cancer Neg Hx    Rectal cancer Neg Hx     Outpatient Encounter Medications as of 11/21/2022  Medication Sig   amLODipine (NORVASC) 5 MG tablet TAKE 1 TABLET(5 MG) BY MOUTH DAILY   chlorthalidone (HYGROTON) 25 MG tablet TAKE 1 TABLET(25 MG) BY MOUTH DAILY   Continuous Glucose Sensor (DEXCOM G6 SENSOR) MISC CHANGE SENSOR EVERY 10 DAILY AS DIRECTED   Continuous Glucose Transmitter (DEXCOM G6 TRANSMITTER) MISC USE TO KEEP TRACK OF SUGAR CHANGE TRANSMITTER EVERY 90 DAYS   esomeprazole (NEXIUM) 40 MG capsule TAKE 1 CAPSULE BY MOUTH DAILY   Insulin Disposable Pump (OMNIPOD 5 DEXG7G6 PODS GEN 5) MISC CHANGE POD EVERY 48 TO 72 HOURS   Insulin Disposable Pump (OMNIPOD 5 G6 INTRO, GEN 5,) KIT Change pod every 48-72 hrs   insulin  lispro (HUMALOG) 100 UNIT/ML injection Use with Omnipod for TDD around 50 units per day   olmesartan (BENICAR) 40 MG tablet Take 1 tablet (40 mg total) by mouth daily.   REPATHA 140 MG/ML SOSY INJECT 140 MG UNDER THE SKIN EVERY 14 DAYS.   triamcinolone cream (KENALOG) 0.1 % Apply 1 Application topically 2 (two) times daily.   [DISCONTINUED] Continuous Blood Gluc Sensor (DEXCOM G6 SENSOR) MISC Change sensor every 10 days as directed   [DISCONTINUED] Continuous Glucose Sensor (DEXCOM G6 SENSOR) MISC CHANGE SENSOR EVERY 10 DAILY AS DIRECTED   [DISCONTINUED] Continuous Glucose Transmitter (DEXCOM G6 TRANSMITTER) MISC USE TO KEEP TRACK OF SUGAR CHANGE TRANSMITTER EVERY 90 DAYS   [DISCONTINUED] gabapentin (NEURONTIN) 300 MG capsule Take 300 mg by mouth 3 (three) times daily.   [DISCONTINUED] Insulin Disposable Pump (OMNIPOD 5 G6 PODS, GEN 5,) MISC CHANGE POD EVERY 48 TO 72 HOURS   [DISCONTINUED] insulin lispro (HUMALOG) 100 UNIT/ML injection Use with Omnipod for TDD around 50 units per day   No facility-administered encounter medications on file as of 11/21/2022.    ALLERGIES: Allergies  Allergen Reactions   Crestor [Rosuvastatin Calcium] Other (See Comments)    Muscle aches   Iron Other (See Comments)    "Tore up my stomach"  "Liquid Iron"   Lipitor [Atorvastatin Calcium] Other (See Comments)    Muscles aches   Statins Other (See Comments)    Muscle aches    VACCINATION STATUS: Immunization History  Administered Date(s) Administered   Influenza, Seasonal, Injecte, Preservative Fre 10/18/2022   Influenza,inj,Quad PF,6+ Mos 01/18/2016, 10/17/2017, 11/06/2019, 10/28/2020   Influenza,inj,quad, With Preservative 10/30/2018   Influenza-Unspecified 10/17/2017, 10/10/2021   PFIZER(Purple Top)SARS-COV-2 Vaccination 04/10/2019, 05/05/2019   Pneumococcal Polysaccharide-23 10/21/2014   Tdap 06/02/2013    Diabetes He presents for his follow-up diabetic visit. He has type 1 diabetes mellitus.  Onset time: Diagnosed with LADA at age of 57. His disease course has been improving. There are no hypoglycemic associated symptoms. Associated symptoms include blurred vision and fatigue. There are no hypoglycemic complications. Symptoms are stable. There are no diabetic complications. Risk factors for coronary artery disease include diabetes mellitus, dyslipidemia, family history, male sex and tobacco exposure. Current diabetic treatment includes insulin pump. He is compliant with treatment most of the time. His weight is fluctuating minimally. He is following a generally  healthy diet. When asked about meal planning, he reported none. He has not had a previous visit with a dietitian. He participates in exercise daily. His home blood glucose trend is decreasing steadily. His overall blood glucose range is 140-180 mg/dl. (He presents today with his CGM and pump showing stable glycemic profile with at target fasting readings and above target postprandial readings.  His POCT A1c today is 7.7%, improving slightly from last visit of 7.9%.  He admits he forgets to bolus sometimes before eating meals and snacks (says he was afraid to do it before snacks for fear of his glucose dropping too low).  Analysis of his CGM shows TIR 64%, TAR 36%, TBR 0% with a GMI of 7.5%.  ) An ACE inhibitor/angiotensin II receptor blocker is being taken. He does not see a podiatrist.Eye exam is not current.    Review of systems  Constitutional: + Minimally fluctuating body weight,  current Body mass index is 27.02 kg/m. , no fatigue, no subjective hyperthermia, no subjective hypothermia Eyes: no blurry vision, no xerophthalmia ENT: no sore throat, no nodules palpated in throat, no dysphagia/odynophagia, no hoarseness Cardiovascular: no chest pain, no shortness of breath, no palpitations, no leg swelling Respiratory: no cough, no shortness of breath Gastrointestinal: no nausea/vomiting/diarrhea Musculoskeletal: no muscle/joint  aches Skin: no rashes, no hyperemia Neurological: no tremors, no numbness, no tingling, no dizziness Psychiatric: no depression, no anxiety  Objective:     BP (!) 164/93   Pulse 63   Ht 5\' 9"  (1.753 m)   Wt 183 lb (83 kg)   BMI 27.02 kg/m   Wt Readings from Last 3 Encounters:  11/21/22 183 lb (83 kg)  10/18/22 183 lb 9.6 oz (83.3 kg)  07/27/22 176 lb 12.8 oz (80.2 kg)     BP Readings from Last 3 Encounters:  11/21/22 (!) 164/93  10/18/22 130/74  07/27/22 138/88      Physical Exam- Limited  Constitutional:  Body mass index is 27.02 kg/m. , not in acute distress, normal state of mind Eyes:  EOMI, no exophthalmos Musculoskeletal: no gross deformities, strength intact in all four extremities, no gross restriction of joint movements Skin:  no rashes, no hyperemia Neurological: no tremor with outstretched hands   Diabetic Foot Exam - Simple   No data filed     CMP ( most recent) CMP     Component Value Date/Time   NA 125 (L) 06/16/2022 0859   K 4.8 06/16/2022 0859   CL 86 (L) 06/16/2022 0859   CO2 24 06/16/2022 0859   GLUCOSE 213 (H) 06/16/2022 0859   GLUCOSE 310 (H) 10/08/2021 1024   BUN 13 06/16/2022 0859   CREATININE 1.08 06/16/2022 0859   CALCIUM 9.1 06/16/2022 0859   PROT 6.4 03/24/2022 1014   ALBUMIN 3.7 (L) 03/24/2022 1014   AST 37 03/24/2022 1014   ALT 27 03/24/2022 1014   ALKPHOS 143 (H) 03/24/2022 1014   BILITOT 0.2 03/24/2022 1014   GFRNONAA >60 10/08/2021 1024   GFRAA >60 10/10/2017 0542     Diabetic Labs (most recent): Lab Results  Component Value Date   HGBA1C 7.7 (A) 11/21/2022   HGBA1C 7.9 (A) 07/27/2022   HGBA1C 7.7 (A) 03/27/2022   MICROALBUR 150 mg/mL 07/27/2022   MICROALBUR 150 06/20/2021   MICROALBUR 17.0 (H) 01/18/2016     Lipid Panel ( most recent) Lipid Panel     Component Value Date/Time   CHOL 230 (H) 02/21/2022 0918   TRIG 61 02/21/2022 0918  HDL 103 02/21/2022 0918   CHOLHDL 2.2 02/21/2022 0918   CHOLHDL 3  10/28/2020 0943   VLDL 15.4 10/28/2020 0943   LDLCALC 117 (H) 02/21/2022 0918   LDLDIRECT 156.0 01/18/2016 1020   LABVLDL 10 02/21/2022 0918      Lab Results  Component Value Date   TSH 2.010 02/21/2022   TSH 1.78 02/02/2021   TSH 1.65 10/28/2020   TSH 2.15 01/18/2016   FREET4 1.03 02/21/2022           Assessment & Plan:   1) Type 1 diabetes mellitus without complication (HCC)  He presents today with his CGM and pump showing stable glycemic profile with at target fasting readings and above target postprandial readings.  His POCT A1c today is 7.7%, improving slightly from last visit of 7.9%.  He admits he forgets to bolus sometimes before eating meals and snacks (says he was afraid to do it before snacks for fear of his glucose dropping too low).  Analysis of his CGM shows TIR 64%, TAR 36%, TBR 0% with a GMI of 7.5%.  - [name] has currently uncontrolled symptomatic LADA DM since 57 years of age.   -Recent labs reviewed.  - I had a long discussion with him about the progressive nature of diabetes and the pathology behind its complications. -his diabetes is complicated by drastic fluctuations and severe hypoglycemia and he remains at a high risk for more acute and chronic complications which include CAD, CVA, CKD, retinopathy, and neuropathy. These are all discussed in detail with him.  The following Lifestyle Medicine recommendations according to American College of Lifestyle Medicine [facility]) were discussed and offered to patient and he agrees to start the journey:  A. Whole Foods, Plant-based plate comprising of fruits and vegetables, plant-based proteins, whole-grain carbohydrates was discussed in detail with the patient.   A list for source of those nutrients were also provided to the patient.  Patient will use only water or unsweetened tea for hydration. B.  The need to stay away from risky substances including alcohol, smoking; obtaining 7 to 9 hours of restorative sleep,  at least 150 minutes of moderate intensity exercise weekly, the importance of healthy social connections,  and stress reduction techniques were discussed. C.  A full color page of  Calorie density of various food groups per pound showing examples of each food groups was provided to the patient.  - Nutritional counseling repeated at each appointment due to patients tendency to fall back in to old habits.  - The patient admits there is a room for improvement in their diet and drink choices. -  Suggestion is made for the patient to avoid simple carbohydrates from their diet including Cakes, Sweet Desserts / Pastries, Ice Cream, Soda (diet and regular), Sweet Tea, Candies, Chips, Cookies, Sweet Pastries, Store Bought Juices, Alcohol in Excess of 1-2 drinks a day, Artificial Sweeteners, Coffee Creamer, and "Sugar-free" Products. This will help patient to have stable blood glucose profile and potentially avoid unintended weight gain.   - I encouraged the patient to switch to unprocessed or minimally processed complex starch and increased protein intake (animal or plant source), fruits, and vegetables.   - Patient is advised to stick to a routine mealtimes to eat 3 meals a day and avoid unnecessary snacks (to snack only to correct hypoglycemia).  - I have approached him with the following individualized plan to manage his diabetes and patient agrees:   -No changes will be made to his pump settings  today.  We discussed the importance of bolusing before meals AND snacks, which he will work on trying to do more consistently.  -he is encouraged to continue monitoring glucose 4 times daily (using his CGM), before meals and before bed, and to call the clinic if he has readings less than 70 or above 300 for 3 tests in a row.  We talked about upgrading his CGM to [facility] G7 once he is able to get pods that are compatible from the pharmacy.  - he is warned not to take insulin without proper monitoring per  orders. - Adjustment parameters are given to him for hypo and hyperglycemia in writing.  - his Metformin was discontinued, risk outweighs benefit for this patient, given his type 1 diagnosis where insulin treatment is necessary.  - Specific targets for  A1c; LDL, HDL, and Triglycerides were discussed with the patient.  2) Blood Pressure /Hypertension:  his blood pressure is above target but he notes he just took his medications on the drive here.   he is advised to continue his current medications including Norvasc 5 mg po daily, Chlorthalidone 25 mg po daily and Olmesartan 40 mg po daily.  He notes his BP was normal during recent visit with Dr. Durwin Nora.  3) Lipids/Hyperlipidemia:    Review of his recent lipid panel from 02/21/22 showed uncontrolled LDL at 117 (improving) . He is allergic to statin medications.  He is advised to avoid fried foods and butter.  4)  Weight/Diet:  his Body mass index is 27.02 kg/m.  -    he is NOT a candidate for weight loss.   Exercise, and detailed carbohydrates information provided  -  detailed on discharge instructions.  5) Chronic Care/Health Maintenance: -he is on ACEI/ARB and not on Statin medications and is encouraged to initiate and continue to follow up with Ophthalmology, Dentist, Podiatrist at least yearly or according to recommendations, and advised to QUIT SMOKING. I have recommended yearly flu vaccine and pneumonia vaccine at least every 5 years; moderate intensity exercise for up to 150 minutes weekly; and sleep for at least 7 hours a day.  - he is advised to maintain close follow up with Durwin Nora, Lucina Mellow, MD for primary care needs, as well as his other providers for optimal and coordinated care.     I spent  36  minutes in the care of the patient today including review of labs from CMP, Lipids, Thyroid Function, Hematology (current and previous including abstractions from other facilities); face-to-face time discussing  his blood glucose  readings/logs, discussing hypoglycemia and hyperglycemia episodes and symptoms, medications doses, his options of short and long term treatment based on the latest standards of care / guidelines;  discussion about incorporating lifestyle medicine;  and documenting the encounter. Risk reduction counseling performed per USPSTF guidelines to reduce obesity and cardiovascular risk factors.     Please refer to Patient Instructions for Blood Glucose Monitoring and Insulin/Medications Dosing Guide"  in media tab for additional information. Please  also refer to " Patient Self Inventory" in the Media  tab for reviewed elements of pertinent patient history.  [name] participated in the discussions, expressed understanding, and voiced agreement with the above plans.  All questions were answered to his satisfaction. he is encouraged to contact clinic should he have any questions or concerns prior to his return visit.     Follow up plan: - Return in about 4 months (around 03/21/2023) for Diabetes F/U with A1c in office, No  previsit labs, Bring meter and logs.   Ronny Bacon, [facility] [address] [place], [place] [address] Phone: [phone] Fax: [phone]  11/21/2022, 10:03 AM

## 2022-11-27 ENCOUNTER — Ambulatory Visit: Payer: Commercial Managed Care - PPO | Admitting: Nurse Practitioner

## 2022-12-04 ENCOUNTER — Other Ambulatory Visit: Payer: Self-pay | Admitting: Internal Medicine

## 2022-12-04 DIAGNOSIS — Z789 Other specified health status: Secondary | ICD-10-CM

## 2022-12-04 DIAGNOSIS — E785 Hyperlipidemia, unspecified: Secondary | ICD-10-CM

## 2022-12-07 ENCOUNTER — Other Ambulatory Visit: Payer: Self-pay

## 2022-12-07 ENCOUNTER — Encounter: Payer: Self-pay | Admitting: Internal Medicine

## 2022-12-07 DIAGNOSIS — E1159 Type 2 diabetes mellitus with other circulatory complications: Secondary | ICD-10-CM

## 2022-12-07 DIAGNOSIS — I152 Hypertension secondary to endocrine disorders: Secondary | ICD-10-CM

## 2022-12-07 MED ORDER — OLMESARTAN MEDOXOMIL 40 MG PO TABS: 40.0000 mg | ORAL_TABLET | Freq: Every day | ORAL | 1 refills | Status: DC

## 2022-12-07 MED ORDER — AMLODIPINE BESYLATE 5 MG PO TABS: ORAL_TABLET | ORAL | 1 refills | Status: DC

## 2022-12-07 MED ORDER — CHLORTHALIDONE 25 MG PO TABS: ORAL_TABLET | ORAL | 1 refills | Status: DC

## 2022-12-24 ENCOUNTER — Other Ambulatory Visit: Payer: Self-pay | Admitting: Internal Medicine

## 2022-12-24 DIAGNOSIS — Z789 Other specified health status: Secondary | ICD-10-CM

## 2022-12-24 DIAGNOSIS — E1069 Type 1 diabetes mellitus with other specified complication: Secondary | ICD-10-CM

## 2022-12-29 ENCOUNTER — Encounter: Payer: Self-pay | Admitting: Internal Medicine

## 2022-12-29 ENCOUNTER — Ambulatory Visit (INDEPENDENT_AMBULATORY_CARE_PROVIDER_SITE_OTHER): Payer: Commercial Managed Care - PPO | Admitting: Internal Medicine

## 2022-12-29 VITALS — BP 171/96 | HR 79 | Ht 69.0 in | Wt 183.0 lb

## 2022-12-29 DIAGNOSIS — J069 Acute upper respiratory infection, unspecified: Secondary | ICD-10-CM | POA: Insufficient documentation

## 2022-12-29 MED ORDER — AZITHROMYCIN 250 MG PO TABS: ORAL_TABLET | ORAL | 0 refills | Status: DC

## 2022-12-29 NOTE — Patient Instructions (Signed)
It was a pleasure to see you today.  Thank you for giving [place] the opportunity to be involved in your care.  Below is a brief recap of your visit and next steps.  We will plan to see you again in April.  Summary Zpak prescribed today for upper respiratory infection. Follow up for routine care in April

## 2022-12-29 NOTE — Progress Notes (Signed)
Acute Office Visit  Subjective:     Patient ID: [name], male    DOB: 1965/06/08, 57 y.o.   MRN: [id number]  Chief Complaint  Patient presents with   URI    Cough, pressure in face, congestion , no fever , at home covid test negative   [name] presents today for an acute visit for persistent URI symptoms.  He currently endorses nasal/sinus congestion with cloudy colored nasal secretions, chest congestion intermittently productive of cloudy colored sputum.  He is clearing his throat frequently.  Denies fever/chills, nausea/vomiting, and diarrhea.  He reports initial onset of similar symptoms 3 weeks ago.  Symptoms temporarily resolved and recently returned.  He has been taking Mucinex as needed for symptom relief.  Review of Systems  Constitutional:  Negative for chills and fever.  HENT:  Positive for congestion, sinus pain and sore throat. Negative for ear pain.   Respiratory:  Positive for cough and sputum production. Negative for shortness of breath.       Objective:    BP (!) 171/96 (BP Location: Left Arm, Patient Position: Sitting, Cuff Size: Normal)   Pulse 79   Ht 5\' 9"  (1.753 m)   Wt 183 lb (83 kg)   SpO2 97%   BMI 27.02 kg/m   Physical Exam Vitals reviewed.  Constitutional:      General: He is not in acute distress.    Appearance: Normal appearance. He is not ill-appearing.  HENT:     Head: Normocephalic and atraumatic.     Right Ear: External ear normal.     Left Ear: External ear normal.     Nose: Congestion and rhinorrhea present.     Mouth/Throat:     Mouth: Mucous membranes are moist.     Pharynx: Oropharynx is clear. Posterior oropharyngeal erythema present.  Eyes:     General: No scleral icterus.    Extraocular Movements: Extraocular movements intact.     Conjunctiva/sclera: Conjunctivae normal.     Pupils: Pupils are equal, round, and reactive to light.  Cardiovascular:     Rate and Rhythm: Normal rate and regular rhythm.     Pulses: Normal  pulses.     Heart sounds: Normal heart sounds. No murmur heard. Pulmonary:     Effort: Pulmonary effort is normal.     Breath sounds: Normal breath sounds. No wheezing, rhonchi or rales.  Abdominal:     General: Abdomen is flat. Bowel sounds are normal. There is no distension.     Palpations: Abdomen is soft.     Tenderness: There is no abdominal tenderness.  Musculoskeletal:        General: No swelling or deformity. Normal range of motion.     Cervical back: Normal range of motion.  Skin:    General: Skin is warm and dry.     Capillary Refill: Capillary refill takes less than 2 seconds.  Neurological:     General: No focal deficit present.     Mental Status: He is alert and oriented to person, place, and time.     Motor: No weakness.  Psychiatric:        Mood and Affect: Mood normal.        Behavior: Behavior normal.        Thought Content: Thought content normal.       Assessment & Plan:   Problem List Items Addressed This Visit       Acute upper respiratory infection   Presenting today for  an acute visit endorsing a 3-week history of the URI symptoms listed above.  He reports that symptoms temporarily resolved and recently returned.  He has been using Mucinex as needed for symptom relief.  Based on his description, I am concerned for superimposed bacterial infection in the setting of previous viral infection.  I have prescribed a Z-Pak today for treatment.  He can continue as needed use of Mucinex and cough/cold medication for supportive care.  He was instructed to return to care if symptoms worsen or fail to improve.      Meds ordered this encounter  Medications   azithromycin (ZITHROMAX Z-PAK) 250 MG tablet    Sig: Take 2 tablets (500 mg) PO today, then 1 tablet (250 mg) PO daily x4 days.    Dispense:  6 tablet    Refill:  0    Return in about 4 months (around 04/29/2023).  Bradley Lade, MD

## 2022-12-29 NOTE — Assessment & Plan Note (Signed)
Presenting today for an acute visit endorsing a 3-week history of the URI symptoms listed above.  He reports that symptoms temporarily resolved and recently returned.  He has been using Mucinex as needed for symptom relief.  Based on his description, I am concerned for superimposed bacterial infection in the setting of previous viral infection.  I have prescribed a Z-Pak today for treatment.  He can continue as needed use of Mucinex and cough/cold medication for supportive care.  He was instructed to return to care if symptoms worsen or fail to improve.

## 2023-01-17 DIAGNOSIS — K529 Noninfective gastroenteritis and colitis, unspecified: Secondary | ICD-10-CM

## 2023-01-17 HISTORY — DX: Noninfective gastroenteritis and colitis, unspecified: K52.9

## 2023-01-27 ENCOUNTER — Encounter: Payer: Self-pay | Admitting: Nurse Practitioner

## 2023-01-29 MED ORDER — DEXCOM G7 SENSOR MISC: 1.0000 | 3 refills | Status: DC

## 2023-02-21 MED ORDER — OMNIPOD 5 DEXG7G6 PODS GEN 5 MISC: 6 refills | Status: DC

## 2023-02-21 NOTE — Addendum Note (Signed)
 Addended by: Meher Kucinski J on: 02/21/2023 03:16 PM   Modules accepted: Orders

## 2023-03-15 ENCOUNTER — Encounter: Payer: Self-pay | Admitting: Internal Medicine

## 2023-03-16 NOTE — Telephone Encounter (Signed)
 Called patient left voicemail to contact our office for an appointment.

## 2023-03-21 ENCOUNTER — Encounter: Payer: Self-pay | Admitting: Nurse Practitioner

## 2023-03-21 ENCOUNTER — Ambulatory Visit (INDEPENDENT_AMBULATORY_CARE_PROVIDER_SITE_OTHER): Payer: Commercial Managed Care - PPO | Admitting: Nurse Practitioner

## 2023-03-21 VITALS — BP 140/90 | HR 58 | Ht 70.0 in | Wt 180.2 lb

## 2023-03-21 DIAGNOSIS — E559 Vitamin D deficiency, unspecified: Secondary | ICD-10-CM | POA: Diagnosis not present

## 2023-03-21 DIAGNOSIS — E782 Mixed hyperlipidemia: Secondary | ICD-10-CM | POA: Diagnosis not present

## 2023-03-21 DIAGNOSIS — Z794 Long term (current) use of insulin: Secondary | ICD-10-CM

## 2023-03-21 DIAGNOSIS — E109 Type 1 diabetes mellitus without complications: Secondary | ICD-10-CM

## 2023-03-21 DIAGNOSIS — I10 Essential (primary) hypertension: Secondary | ICD-10-CM | POA: Diagnosis not present

## 2023-03-21 LAB — POCT GLYCOSYLATED HEMOGLOBIN (HGB A1C): Hemoglobin A1C: 8.1 % — AB (ref 4.0–5.6)

## 2023-03-21 MED ORDER — OMNIPOD 5 DEXG7G6 PODS GEN 5 MISC: 6 refills | Status: DC

## 2023-03-21 NOTE — Progress Notes (Signed)
 Endocrinology Follow Up Note       03/21/2023, 8:57 AM   Subjective:    Patient ID: [name], male    DOB: 1965-01-31.  [name] is being seen in follow up after being seen in consultation for management of currently uncontrolled symptomatic diabetes requested by  Billie Lade, MD.   Past Medical History:  Diagnosis Date   Allergy    Anemia    Diabetes mellitus without complication (HCC)    type 1 per patient   GERD (gastroesophageal reflux disease)    HLD (hyperlipidemia)    Hypertension     Past Surgical History:  Procedure Laterality Date   ANKLE SURGERY Right    LUMBAR LAMINECTOMY/DECOMPRESSION MICRODISCECTOMY N/A 10/07/2021   Procedure: Microlumbar decompression L4-5, possible L5-S1;  Surgeon: Jene Every, MD;  Location: [facility] OR;  Service: Orthopedics;  Laterality: N/A;   UPPER GASTROINTESTINAL ENDOSCOPY     VASECTOMY  1994    Social History   Socioeconomic History   Marital status: Married    Spouse name: Not on file   Number of children: 1   Years of education: Not on file   Highest education level: Associate degree: occupational, Scientist, product/process development, or vocational program  Occupational History   Occupation: Holiday representative  Tobacco Use   Smoking status: Every Day    Current packs/day: 1.00    Average packs/day: 1 pack/day for 30.0 years (30.0 ttl pk-yrs)    Types: Cigarettes   Smokeless tobacco: Never  Vaping Use   Vaping status: Never Used  Substance and Sexual Activity   Alcohol use: Not Currently    Alcohol/week: 0.0 - 8.0 standard drinks of alcohol   Drug use: No   Sexual activity: Yes  Other Topics Concern   Not on file  Social History Narrative   Not on file   Social Drivers of Health   Financial Resource Strain: High Risk (09/11/2022)   Overall Financial Resource Strain (CARDIA)    Difficulty of Paying Living Expenses: Hard  Food Insecurity: Patient Declined (09/11/2022)    Hunger Vital Sign    Worried About Running Out of Food in the Last Year: Patient declined    Ran Out of Food in the Last Year: Patient declined  Transportation Needs: No Transportation Needs (09/11/2022)   PRAPARE - Administrator, Civil Service (Medical): No    Lack of Transportation (Non-Medical): No  Physical Activity: Not on file  Stress: Not on file  Social Connections: Unknown (09/11/2022)   Social Connection and Isolation Panel [NHANES]    Frequency of Communication with Friends and Family: Not on file    Frequency of Social Gatherings with Friends and Family: Not on file    Attends Religious Services: Not on file    Active Member of Clubs or Organizations: No    Attends Banker Meetings: Not on file    Marital Status: Married    Family History  Problem Relation Age of Onset   COPD Mother    Diabetes Mother    Early death Father 53       MI   Heart attack Father    Lymphoma Sister 4  Hypertension Brother    Diabetes Maternal Grandmother    Stroke Neg Hx    Alcohol abuse Neg Hx    Drug abuse Neg Hx    Hyperlipidemia Neg Hx    Kidney disease Neg Hx    Colon cancer Neg Hx    Esophageal cancer Neg Hx    Stomach cancer Neg Hx    Rectal cancer Neg Hx     Outpatient Encounter Medications as of 03/21/2023  Medication Sig   amLODipine (NORVASC) 5 MG tablet TAKE 1 TABLET(5 MG) BY MOUTH DAILY   chlorthalidone (HYGROTON) 25 MG tablet TAKE 1 TABLET(25 MG) BY MOUTH DAILY   Continuous Glucose Sensor (DEXCOM G7 SENSOR) MISC Inject 1 Application into the skin as directed. Change sensor every 10 days as directed.   esomeprazole (NEXIUM) 40 MG capsule TAKE 1 CAPSULE BY MOUTH DAILY   insulin lispro (HUMALOG) 100 UNIT/ML injection Use with Omnipod for TDD around 50 units per day   olmesartan (BENICAR) 40 MG tablet Take 1 tablet (40 mg total) by mouth daily.   REPATHA 140 MG/ML SOSY INJECT 140 MG UNDER THE SKIN EVERY 14 DAYS.   triamcinolone cream (KENALOG)  0.1 % Apply 1 Application topically 2 (two) times daily.   azithromycin (ZITHROMAX Z-PAK) 250 MG tablet Take 2 tablets (500 mg) PO today, then 1 tablet (250 mg) PO daily x4 days. (Patient not taking: Reported on 03/21/2023)   Continuous Glucose Sensor (DEXCOM G6 SENSOR) MISC CHANGE SENSOR EVERY 10 DAILY AS DIRECTED (Patient not taking: Reported on 03/21/2023)   Continuous Glucose Transmitter (DEXCOM G6 TRANSMITTER) MISC USE TO KEEP TRACK OF SUGAR CHANGE TRANSMITTER EVERY 90 DAYS (Patient not taking: Reported on 03/21/2023)   Insulin Disposable Pump (OMNIPOD 5 DEXG7G6 PODS GEN 5) MISC CHANGE POD EVERY 48 TO 72 HOURS   Insulin Disposable Pump (OMNIPOD 5 G6 INTRO, GEN 5,) KIT Change pod every 48-72 hrs (Patient not taking: Reported on 03/21/2023)   [DISCONTINUED] Insulin Disposable Pump (OMNIPOD 5 DEXG7G6 PODS GEN 5) MISC CHANGE POD EVERY 48 TO 72 HOURS   No facility-administered encounter medications on file as of 03/21/2023.    ALLERGIES: Allergies  Allergen Reactions   Crestor [Rosuvastatin Calcium] Other (See Comments)    Muscle aches   Iron Other (See Comments)    "Tore up my stomach"  "Liquid Iron"   Lipitor [Atorvastatin Calcium] Other (See Comments)    Muscles aches   Statins Other (See Comments)    Muscle aches    VACCINATION STATUS: Immunization History  Administered Date(s) Administered   Influenza, Seasonal, Injecte, Preservative Fre 10/18/2022   Influenza,inj,Quad PF,6+ Mos 01/18/2016, 10/17/2017, 11/06/2019, 10/28/2020   Influenza,inj,quad, With Preservative 10/30/2018   Influenza-Unspecified 10/17/2017, 10/10/2021   PFIZER(Purple Top)SARS-COV-2 Vaccination 04/10/2019, 05/05/2019   Pneumococcal Polysaccharide-23 10/21/2014   Tdap 06/02/2013    Diabetes He presents for his follow-up diabetic visit. He has type 1 diabetes mellitus. Onset time: Diagnosed with LADA at age of 58. His disease course has been fluctuating. There are no hypoglycemic associated symptoms. Associated  symptoms include blurred vision and fatigue. There are no hypoglycemic complications. Symptoms are stable. There are no diabetic complications. Risk factors for coronary artery disease include diabetes mellitus, dyslipidemia, family history, male sex and tobacco exposure. Current diabetic treatment includes insulin pump. He is compliant with treatment most of the time. His weight is fluctuating minimally. He is following a generally healthy diet. When asked about meal planning, he reported none. He has not had a previous visit with a dietitian.  He participates in exercise daily. His home blood glucose trend is fluctuating minimally. His overall blood glucose range is 140-180 mg/dl. (He presents today with his CGM and pump showing stable glycemic profile with at target fasting readings and above target postprandial readings.  His POCT A1c today is 8.1%, improving slightly from last visit of 7.7%.  It appears he continues to forget to input carbs PRIOR to his meal and thus is not bolusing properly.   Analysis of his CGM shows TIR 63%, TAR 37%, TBR 0% with a GMI of 7.5%.  He also notes connection issues with his G7 and pods, is having to waste insulin and pods by changing those out every time he changes his Dexcom.) An ACE inhibitor/angiotensin II receptor blocker is being taken. He does not see a podiatrist.Eye exam is not current.    Review of systems  Constitutional: + Minimally fluctuating body weight,  current Body mass index is 25.86 kg/m. , no fatigue, no subjective hyperthermia, no subjective hypothermia Eyes: no blurry vision, no xerophthalmia ENT: no sore throat, no nodules palpated in throat, no dysphagia/odynophagia, no hoarseness Cardiovascular: no chest pain, no shortness of breath, no palpitations, no leg swelling Respiratory: no cough, no shortness of breath Gastrointestinal: no nausea/vomiting/diarrhea Musculoskeletal: no muscle/joint aches Skin: no rashes, no hyperemia Neurological: no  tremors, no numbness, no tingling, no dizziness Psychiatric: no depression, no anxiety  Objective:     BP (!) 140/90 (BP Location: Left Arm, Patient Position: Sitting, Cuff Size: Large) Comment: Patient just took his  BP medication  Pulse (!) 58   Ht 5\' 10"  (1.778 m)   Wt 180 lb 3.2 oz (81.7 kg)   BMI 25.86 kg/m   Wt Readings from Last 3 Encounters:  03/21/23 180 lb 3.2 oz (81.7 kg)  12/29/22 183 lb (83 kg)  11/21/22 183 lb (83 kg)     BP Readings from Last 3 Encounters:  03/21/23 (!) 140/90  12/29/22 (!) 171/96  11/21/22 (!) 164/93      Physical Exam- Limited  Constitutional:  Body mass index is 25.86 kg/m. , not in acute distress, normal state of mind Eyes:  EOMI, no exophthalmos Musculoskeletal: no gross deformities, strength intact in all four extremities, no gross restriction of joint movements Skin:  no rashes, no hyperemia Neurological: no tremor with outstretched hands   Diabetic Foot Exam - Simple   No data filed     CMP ( most recent) CMP     Component Value Date/Time   NA 125 (L) 06/16/2022 0859   K 4.8 06/16/2022 0859   CL 86 (L) 06/16/2022 0859   CO2 24 06/16/2022 0859   GLUCOSE 213 (H) 06/16/2022 0859   GLUCOSE 310 (H) 10/08/2021 1024   BUN 13 06/16/2022 0859   CREATININE 1.08 06/16/2022 0859   CALCIUM 9.1 06/16/2022 0859   PROT 6.4 03/24/2022 1014   ALBUMIN 3.7 (L) 03/24/2022 1014   AST 37 03/24/2022 1014   ALT 27 03/24/2022 1014   ALKPHOS 143 (H) 03/24/2022 1014   BILITOT 0.2 03/24/2022 1014   GFRNONAA >60 10/08/2021 1024   GFRAA >60 10/10/2017 0542     Diabetic Labs (most recent): Lab Results  Component Value Date   HGBA1C 8.1 (A) 03/21/2023   HGBA1C 7.7 (A) 11/21/2022   HGBA1C 7.9 (A) 07/27/2022   MICROALBUR 150 mg/mL 07/27/2022   MICROALBUR 150 06/20/2021   MICROALBUR 17.0 (H) 01/18/2016     Lipid Panel ( most recent) Lipid Panel     Component  Value Date/Time   CHOL 230 (H) 02/21/2022 0918   TRIG 61 02/21/2022 0918    HDL 103 02/21/2022 0918   CHOLHDL 2.2 02/21/2022 0918   CHOLHDL 3 10/28/2020 0943   VLDL 15.4 10/28/2020 0943   LDLCALC 117 (H) 02/21/2022 0918   LDLDIRECT 156.0 01/18/2016 1020   LABVLDL 10 02/21/2022 0918      Lab Results  Component Value Date   TSH 2.010 02/21/2022   TSH 1.78 02/02/2021   TSH 1.65 10/28/2020   TSH 2.15 01/18/2016   FREET4 1.03 02/21/2022           Assessment & Plan:   1) Type 1 diabetes mellitus without complication (HCC)  He presents today with his CGM and pump showing stable glycemic profile with at target fasting readings and above target postprandial readings.  His POCT A1c today is 8.1%, improving slightly from last visit of 7.7%.  It appears he continues to forget to input carbs PRIOR to his meal and thus is not bolusing properly.   Analysis of his CGM shows TIR 63%, TAR 37%, TBR 0% with a GMI of 7.5%.  He also notes connection issues with his G7 and pods, is having to waste insulin and pods by changing those out every time he changes his Dexcom.  - JARQUAVIOUS FENTRESS has currently uncontrolled symptomatic LADA DM since 58 years of age.   -Recent labs reviewed.  - I had a long discussion with him about the progressive nature of diabetes and the pathology behind its complications. -his diabetes is complicated by drastic fluctuations and severe hypoglycemia and he remains at a high risk for more acute and chronic complications which include CAD, CVA, CKD, retinopathy, and neuropathy. These are all discussed in detail with him.  The following Lifestyle Medicine recommendations according to American College of Lifestyle Medicine [facility]) were discussed and offered to patient and he agrees to start the journey:  A. Whole Foods, Plant-based plate comprising of fruits and vegetables, plant-based proteins, whole-grain carbohydrates was discussed in detail with the patient.   A list for source of those nutrients were also provided to the patient.  Patient will use only  water or unsweetened tea for hydration. B.  The need to stay away from risky substances including alcohol, smoking; obtaining 7 to 9 hours of restorative sleep, at least 150 minutes of moderate intensity exercise weekly, the importance of healthy social connections,  and stress reduction techniques were discussed. C.  A full color page of  Calorie density of various food groups per pound showing examples of each food groups was provided to the patient.  - Nutritional counseling repeated at each appointment due to patients tendency to fall back in to old habits.  - The patient admits there is a room for improvement in their diet and drink choices. -  Suggestion is made for the patient to avoid simple carbohydrates from their diet including Cakes, Sweet Desserts / Pastries, Ice Cream, Soda (diet and regular), Sweet Tea, Candies, Chips, Cookies, Sweet Pastries, Store Bought Juices, Alcohol in Excess of 1-2 drinks a day, Artificial Sweeteners, Coffee Creamer, and "Sugar-free" Products. This will help patient to have stable blood glucose profile and potentially avoid unintended weight gain.   - I encouraged the patient to switch to unprocessed or minimally processed complex starch and increased protein intake (animal or plant source), fruits, and vegetables.   - Patient is advised to stick to a routine mealtimes to eat 3 meals a day and avoid unnecessary  snacks (to snack only to correct hypoglycemia).  - I have approached him with the following individualized plan to manage his diabetes and patient agrees:   -No changes will be made to his pump settings today.  We discussed the importance of bolusing before meals AND snacks, which he will work on trying to do more consistently.  I did reach out to [facility] and Omnipod team to discuss recent connection issues and how to correct that.  -he is encouraged to continue monitoring glucose 4 times daily (using his CGM), before meals and before bed, and to call the  clinic if he has readings less than 70 or above 300 for 3 tests in a row.    - he is warned not to take insulin without proper monitoring per orders. - Adjustment parameters are given to him for hypo and hyperglycemia in writing.  - his Metformin was discontinued, risk outweighs benefit for this patient, given his type 1 diagnosis where insulin treatment is necessary.  - Specific targets for  A1c; LDL, HDL, and Triglycerides were discussed with the patient.  2) Blood Pressure /Hypertension:  his blood pressure is above target but he notes he just took his medications on the drive here.   he is advised to continue his current medications including Norvasc 5 mg po daily, Chlorthalidone 25 mg po daily and Olmesartan 40 mg po daily.  He notes his BP was normal during recent visit with Dr. Durwin Nora.  3) Lipids/Hyperlipidemia:    Review of his recent lipid panel from 02/21/22 showed uncontrolled LDL at 117 (improving) . He is allergic to statin medications.  He is advised to avoid fried foods and butter.  4)  Weight/Diet:  his Body mass index is 25.86 kg/m.  -    he is NOT a candidate for weight loss.   Exercise, and detailed carbohydrates information provided  -  detailed on discharge instructions.  5) Chronic Care/Health Maintenance: -he is on ACEI/ARB and not on Statin medications and is encouraged to initiate and continue to follow up with Ophthalmology, Dentist, Podiatrist at least yearly or according to recommendations, and advised to QUIT SMOKING. I have recommended yearly flu vaccine and pneumonia vaccine at least every 5 years; moderate intensity exercise for up to 150 minutes weekly; and sleep for at least 7 hours a day.  - he is advised to maintain close follow up with Durwin Nora, Lucina Mellow, MD for primary care needs, as well as his other providers for optimal and coordinated care.      I spent  27  minutes in the care of the patient today including review of labs from CMP, Lipids, Thyroid  Function, Hematology (current and previous including abstractions from other facilities); face-to-face time discussing  his blood glucose readings/logs, discussing hypoglycemia and hyperglycemia episodes and symptoms, medications doses, his options of short and long term treatment based on the latest standards of care / guidelines;  discussion about incorporating lifestyle medicine;  and documenting the encounter. Risk reduction counseling performed per USPSTF guidelines to reduce obesity and cardiovascular risk factors.     Please refer to Patient Instructions for Blood Glucose Monitoring and Insulin/Medications Dosing Guide"  in media tab for additional information. Please  also refer to " Patient Self Inventory" in the Media  tab for reviewed elements of pertinent patient history.  [name] participated in the discussions, expressed understanding, and voiced agreement with the above plans.  All questions were answered to his satisfaction. he is encouraged to contact  clinic should he have any questions or concerns prior to his return visit.     Follow up plan: - Return in about 4 months (around 07/21/2023) for Diabetes F/U with A1c in office, No previsit labs, Bring meter and logs.   Ronny Bacon, [facility] [address] [place], [place] [address] Phone: [phone] Fax: [phone]  03/21/2023, 8:57 AM

## 2023-04-05 ENCOUNTER — Encounter: Payer: Self-pay | Admitting: *Deleted

## 2023-04-10 ENCOUNTER — Telehealth: Payer: Self-pay | Admitting: Internal Medicine

## 2023-04-10 NOTE — Telephone Encounter (Signed)
 Copied from CRM [id number]. Topic: General - Call Back - No Documentation >> Apr 10, 2023  2:58 PM Ja-Kwan M wrote: Reason for CRM: Patient stated that he received a message to call the office to schedule an appointment for diabetic eye exam but he reports that he has already completed the eye exam.

## 2023-04-14 ENCOUNTER — Other Ambulatory Visit: Payer: Self-pay | Admitting: Internal Medicine

## 2023-04-14 DIAGNOSIS — Z789 Other specified health status: Secondary | ICD-10-CM

## 2023-04-14 DIAGNOSIS — E1069 Type 1 diabetes mellitus with other specified complication: Secondary | ICD-10-CM

## 2023-04-16 ENCOUNTER — Encounter: Payer: Self-pay | Admitting: Nurse Practitioner

## 2023-04-16 MED ORDER — DEXCOM G7 SENSOR MISC: 1.0000 | 3 refills | Status: DC

## 2023-04-23 ENCOUNTER — Other Ambulatory Visit (HOSPITAL_COMMUNITY): Payer: Self-pay

## 2023-04-23 ENCOUNTER — Encounter: Payer: Self-pay | Admitting: Internal Medicine

## 2023-04-23 ENCOUNTER — Other Ambulatory Visit: Payer: Self-pay | Admitting: Internal Medicine

## 2023-04-23 DIAGNOSIS — E1069 Type 1 diabetes mellitus with other specified complication: Secondary | ICD-10-CM

## 2023-04-23 DIAGNOSIS — Z789 Other specified health status: Secondary | ICD-10-CM

## 2023-04-24 ENCOUNTER — Telehealth: Payer: Self-pay | Admitting: Pharmacy Technician

## 2023-04-24 ENCOUNTER — Other Ambulatory Visit (HOSPITAL_COMMUNITY): Payer: Self-pay

## 2023-04-24 NOTE — Telephone Encounter (Signed)
 Pharmacy Patient Advocate Encounter   Received notification from Patient Advice Request messages that prior authorization for Repatha 140MG /ML syringes is required/requested.   Insurance verification completed.   The patient is insured through [employer] .   Per test claim: PA required; PA submitted to above mentioned insurance via CoverMyMeds Key/confirmation #/EOC [facility] Status is pending

## 2023-04-24 NOTE — Telephone Encounter (Signed)
 PA request has been Started. New Encounter has been or will be created for follow up. For additional info see Pharmacy Prior Auth telephone encounter from 04/24/2023.

## 2023-04-26 ENCOUNTER — Other Ambulatory Visit (HOSPITAL_COMMUNITY): Payer: Self-pay

## 2023-04-26 NOTE — Telephone Encounter (Signed)
 Pharmacy Patient Advocate Encounter  Received notification from [employer] that Prior Authorization for Repatha 140MG /ML syringes has been APPROVED from 04/26/2023 to 04/25/2024. Ran test claim, Copay is $0.00. This test claim was processed through [facility]- copay amounts may vary at other pharmacies due to pharmacy/plan contracts, or as the patient moves through the different stages of their insurance plan.   PA #/Case ID/Reference #: [id number]

## 2023-05-07 ENCOUNTER — Encounter: Payer: Self-pay | Admitting: Nurse Practitioner

## 2023-06-05 ENCOUNTER — Other Ambulatory Visit: Payer: Self-pay | Admitting: Internal Medicine

## 2023-06-05 DIAGNOSIS — E1069 Type 1 diabetes mellitus with other specified complication: Secondary | ICD-10-CM

## 2023-06-05 DIAGNOSIS — Z789 Other specified health status: Secondary | ICD-10-CM

## 2023-06-06 ENCOUNTER — Other Ambulatory Visit: Payer: Self-pay | Admitting: Internal Medicine

## 2023-06-06 DIAGNOSIS — E1159 Type 2 diabetes mellitus with other circulatory complications: Secondary | ICD-10-CM

## 2023-06-28 NOTE — Telephone Encounter (Signed)
 Patient returned missed call to schedule. Scheduled with Rice Chamorro. Unable to come tomorrow for Dr Kermit Ped. May need refills before appt. Will request once he has the names. Already had Eye exam per pt. Thank You

## 2023-06-28 NOTE — Telephone Encounter (Signed)
 Called patient left a voicemail to call office to schedule appointment.

## 2023-06-29 ENCOUNTER — Other Ambulatory Visit: Payer: Self-pay | Admitting: Internal Medicine

## 2023-06-29 DIAGNOSIS — I152 Hypertension secondary to endocrine disorders: Secondary | ICD-10-CM

## 2023-07-02 ENCOUNTER — Encounter: Payer: Self-pay | Admitting: Nurse Practitioner

## 2023-07-03 MED ORDER — DEXCOM G6 TRANSMITTER MISC
1.0000 | 4 refills | Status: DC
  Filled 2023-07-18: qty 1, 90d supply, fill #0
  Filled 2023-07-21: qty 1, 14d supply, fill #0

## 2023-07-17 ENCOUNTER — Other Ambulatory Visit (HOSPITAL_COMMUNITY): Payer: Self-pay

## 2023-07-17 ENCOUNTER — Ambulatory Visit: Payer: Self-pay

## 2023-07-17 ENCOUNTER — Other Ambulatory Visit: Payer: Self-pay

## 2023-07-17 VITALS — BP 150/90 | HR 81 | Ht 70.0 in | Wt 180.1 lb

## 2023-07-17 DIAGNOSIS — F17200 Nicotine dependence, unspecified, uncomplicated: Secondary | ICD-10-CM | POA: Diagnosis not present

## 2023-07-17 DIAGNOSIS — E1069 Type 1 diabetes mellitus with other specified complication: Secondary | ICD-10-CM

## 2023-07-17 DIAGNOSIS — R21 Rash and other nonspecific skin eruption: Secondary | ICD-10-CM | POA: Diagnosis not present

## 2023-07-17 DIAGNOSIS — E785 Hyperlipidemia, unspecified: Secondary | ICD-10-CM

## 2023-07-17 DIAGNOSIS — I152 Hypertension secondary to endocrine disorders: Secondary | ICD-10-CM

## 2023-07-17 DIAGNOSIS — Z789 Other specified health status: Secondary | ICD-10-CM

## 2023-07-17 MED ORDER — TRIAMCINOLONE ACETONIDE 0.1 % EX CREA
1.0000 | TOPICAL_CREAM | Freq: Two times a day (BID) | CUTANEOUS | 2 refills | Status: DC
Start: 2023-07-17 — End: 2023-07-17
  Filled 2023-07-17: qty 15, 8d supply, fill #0

## 2023-07-17 MED ORDER — REPATHA 140 MG/ML ~~LOC~~ SOSY
140.0000 mg | PREFILLED_SYRINGE | SUBCUTANEOUS | 5 refills | Status: DC
Start: 2023-07-17 — End: 2023-07-17
  Filled 2023-07-17: qty 2, 28d supply, fill #0

## 2023-07-17 MED ORDER — REPATHA 140 MG/ML ~~LOC~~ SOSY
140.0000 mg | PREFILLED_SYRINGE | SUBCUTANEOUS | 5 refills | Status: DC
Start: 2023-07-17 — End: 2023-10-17
  Filled 2023-07-17: qty 2, 28d supply, fill #0
  Filled 2023-07-21 – 2023-09-21 (×2): qty 2, 28d supply, fill #1

## 2023-07-17 MED ORDER — TRIAMCINOLONE ACETONIDE 0.1 % EX CREA
1.0000 | TOPICAL_CREAM | Freq: Two times a day (BID) | CUTANEOUS | 2 refills | Status: AC
  Filled 2023-07-17: qty 15, 8d supply, fill #0
  Filled 2023-07-21: qty 15, 8d supply, fill #1
  Filled 2023-07-29 – 2023-08-23 (×3): qty 15, 8d supply, fill #2

## 2023-07-17 NOTE — Assessment & Plan Note (Signed)
 He states that he was unable to get his Repatha  filled last month.  I looks like the PA has been approved.   We sent this in to [facility] community pharmacy at [facility], since he reports shortages at [facility].   -Repeat lipid panel ordered today

## 2023-07-17 NOTE — Assessment & Plan Note (Signed)
 Recurrent in nature -Triamcinolone  0.1% cream refilled today for twice daily application

## 2023-07-17 NOTE — Progress Notes (Signed)
 Established Patient Office Visit  Subjective   Patient ID: [name], male    DOB: Jun 30, 1965  Age: 58 y.o. MRN: [id number]  Chief Complaint  Patient presents with   Medical Management of Chronic Issues    Follow up/med check/refills    HPI  Patient Active Problem List   Diagnosis Date Noted   Acute upper respiratory infection 12/29/2022   Colon cancer screening 10/18/2022   Hyponatremia 10/18/2022   Need for influenza vaccination 10/18/2022   Bilateral hearing loss 10/18/2022   Rash in adult 06/27/2022   Contact dermatitis 05/03/2022   Elevated alkaline phosphatase level 03/21/2022   Hyperkalemia 03/21/2022   Tobacco use 02/23/2022   Encounter for general adult medical examination with abnormal findings 02/23/2022   HNP (herniated nucleus pulposus), lumbar 10/07/2021   Vitamin D  insufficiency 04/25/2021   Nicotine  dependence with current use 10/28/2020   Gastrointestinal hemorrhage 10/18/2017   Leg edema 08/31/2017   Erectile dysfunction 05/14/2017   Carpal tunnel syndrome 01/04/2017   Hypertension associated with diabetes (HCC) 01/04/2017   Iron deficiency anemia due to chronic blood loss 01/20/2016   Diabetes mellitus type 1 (HCC) 01/18/2016   Hyperlipidemia due to T1DM [facility]) - Statin Intolerant 01/18/2016   Gastroesophageal reflux disease without esophagitis 01/18/2016      ROS    Objective:     BP (!) 150/90 (BP Location: Left Arm, Patient Position: Sitting, Cuff Size: Normal)   Pulse 81   Ht 5' 10 (1.778 m)   Wt 180 lb 1.9 oz (81.7 kg)   SpO2 96%   BMI 25.84 kg/m  BP Readings from Last 3 Encounters:  07/17/23 (!) 150/90  03/21/23 (!) 140/90  12/29/22 (!) 171/96   Wt Readings from Last 3 Encounters:  07/17/23 180 lb 1.9 oz (81.7 kg)  03/21/23 180 lb 3.2 oz (81.7 kg)  12/29/22 183 lb (83 kg)     Physical Exam Vitals and nursing note reviewed.  Constitutional:      Appearance: Normal appearance.   Eyes:     Extraocular Movements:  Extraocular movements intact.     Pupils: Pupils are equal, round, and reactive to light.    Cardiovascular:     Rate and Rhythm: Normal rate and regular rhythm.  Pulmonary:     Effort: Pulmonary effort is normal.     Breath sounds: Normal breath sounds.   Musculoskeletal:     Cervical back: Normal range of motion and neck supple.   Neurological:     Mental Status: He is alert and oriented to person, place, and time.   Psychiatric:        Mood and Affect: Mood normal.        Thought Content: Thought content normal.      No results found for any visits on 07/17/23.  Last CBC Lab Results  Component Value Date   WBC 8.2 02/21/2022   HGB 13.7 02/21/2022   HCT 40.2 02/21/2022   MCV 96 02/21/2022   MCH 32.6 02/21/2022   RDW 11.9 02/21/2022   PLT 361 02/21/2022   Last metabolic panel Lab Results  Component Value Date   GLUCOSE 213 (H) 06/16/2022   NA 125 (L) 06/16/2022   K 4.8 06/16/2022   CL 86 (L) 06/16/2022   CO2 24 06/16/2022   BUN 13 06/16/2022   CREATININE 1.08 06/16/2022   EGFR 81 06/16/2022   CALCIUM  9.1 06/16/2022   PROT 6.4 03/24/2022   ALBUMIN 3.7 (L) 03/24/2022   LABGLOB 2.7 03/24/2022  AGRATIO 1.4 03/24/2022   BILITOT 0.2 03/24/2022   ALKPHOS 143 (H) 03/24/2022   AST 37 03/24/2022   ALT 27 03/24/2022   ANIONGAP 8 10/08/2021   Last lipids Lab Results  Component Value Date   CHOL 230 (H) 02/21/2022   HDL 103 02/21/2022   LDLCALC 117 (H) 02/21/2022   LDLDIRECT 156.0 01/18/2016   TRIG 61 02/21/2022   CHOLHDL 2.2 02/21/2022   Last hemoglobin A1c Lab Results  Component Value Date   HGBA1C 8.1 (A) 03/21/2023      The ASCVD Risk score (Arnett DK, et al., 2019) failed to calculate for the following reasons:   The valid HDL cholesterol range is 20 to 100 mg/dL    Assessment & Plan:   Problem List Items Addressed This Visit       Cardiovascular and Mediastinum   Hypertension associated with diabetes (HCC) - Primary (Chronic)   Elevated  today, but had been out of his olmesartan  for a few weeks d/t pharmacy being out.  He is currently taking.  Will have him continue current antihypertensive regimen consisting of olmesartan  20 mg daily, amlodipine  5 mg daily, and amlodipine  25 mg daily.   Recommend working on smoking cessation and low sodium diet.   Monitor BP at home. Goal is 130/80 or less.   No medication changes are indicated today.      Relevant Medications   Evolocumab  (REPATHA ) 140 MG/ML SOSY     Endocrine   Hyperlipidemia due to T1DM (HCC) - Statin Intolerant   He states that he was unable to get his Repatha  filled last month.  I looks like the PA has been approved.   We sent this in to [facility], since he reports shortages at [facility] [facility].   -Repeat lipid panel ordered today      Relevant Medications   Evolocumab  (REPATHA ) 140 MG/ML SOSY   Other Relevant Orders   Basic Metabolic Panel (BMET)   Lipid Profile     Musculoskeletal and Integument   Rash in adult   Recurrent in nature -Triamcinolone  0.1% cream refilled today for twice daily application       Relevant Medications   triamcinolone  cream (KENALOG ) 0.1 %     Other   Nicotine  dependence with current use   Smoking cessation instruction/counseling given:  counseled patient on the dangers of tobacco use, advised patient to stop smoking, and reviewed strategies to maximize success Total time spent counseling approximately 3 minutes.        Other Visit Diagnoses       Statin intolerance       Relevant Medications   Evolocumab  (REPATHA ) 140 MG/ML SOSY       Return in about 3 months (around 10/17/2023) for chronic follow-up with PCP.    Leita Longs, FNP

## 2023-07-17 NOTE — Assessment & Plan Note (Signed)
 Elevated today, but had been out of his olmesartan  for a few weeks d/t pharmacy being out.  He is currently taking.  Will have him continue current antihypertensive regimen consisting of olmesartan  20 mg daily, amlodipine  5 mg daily, and amlodipine  25 mg daily.   Recommend working on smoking cessation and low sodium diet.   Monitor BP at home. Goal is 130/80 or less.   No medication changes are indicated today.

## 2023-07-17 NOTE — Assessment & Plan Note (Signed)
 Smoking cessation instruction/counseling given:  counseled patient on the dangers of tobacco use, advised patient to stop smoking, and reviewed strategies to maximize success Total time spent counseling approximately 3 minutes.

## 2023-07-18 ENCOUNTER — Other Ambulatory Visit: Payer: Self-pay

## 2023-07-18 ENCOUNTER — Encounter: Payer: Self-pay | Admitting: Pharmacist

## 2023-07-18 ENCOUNTER — Other Ambulatory Visit (HOSPITAL_COMMUNITY): Payer: Self-pay

## 2023-07-18 MED ORDER — CHLORTHALIDONE 25 MG PO TABS
25.0000 mg | ORAL_TABLET | Freq: Every day | ORAL | 0 refills | Status: DC
  Filled 2023-07-18 – 2023-08-23 (×7): qty 90, 90d supply, fill #0

## 2023-07-18 MED ORDER — OMNIPOD 5 DEXG7G6 PODS GEN 5 MISC
4 refills | Status: DC
Start: 2023-05-30 — End: 2023-11-23
  Filled 2023-07-18 – 2023-07-23 (×2): qty 10, 30d supply, fill #0
  Filled 2023-08-19 – 2023-08-23 (×3): qty 10, 30d supply, fill #1
  Filled ????-??-??: fill #0

## 2023-07-18 MED ORDER — AMLODIPINE BESYLATE 5 MG PO TABS
5.0000 mg | ORAL_TABLET | Freq: Every day | ORAL | 0 refills | Status: DC
  Filled 2023-07-18 – 2023-08-23 (×8): qty 90, 90d supply, fill #0

## 2023-07-18 MED ORDER — ESOMEPRAZOLE MAGNESIUM 40 MG PO CPDR
40.0000 mg | DELAYED_RELEASE_CAPSULE | Freq: Every day | ORAL | 2 refills | Status: DC
  Filled 2023-07-18 – 2023-08-22 (×10): qty 90, 90d supply, fill #0

## 2023-07-18 MED ORDER — REPATHA 140 MG/ML ~~LOC~~ SOSY
140.0000 mg | PREFILLED_SYRINGE | SUBCUTANEOUS | 0 refills | Status: DC
Start: 2023-06-06 — End: 2023-08-27
  Filled 2023-07-18 – 2023-08-10 (×3): qty 2, 28d supply, fill #0

## 2023-07-18 MED ORDER — DEXCOM G7 SENSOR MISC
3 refills | Status: DC
  Filled 2023-07-18 – 2023-08-27 (×7): qty 9, 90d supply, fill #0

## 2023-07-18 MED ORDER — INSULIN LISPRO 100 UNIT/ML IJ SOLN
50.0000 [IU] | Freq: Every day | INTRAMUSCULAR | 0 refills | Status: DC
  Filled 2023-07-18 – 2023-08-27 (×6): qty 40, 80d supply, fill #0

## 2023-07-19 LAB — LIPID PANEL
Chol/HDL Ratio: 2.5 ratio (ref 0.0–5.0)
Cholesterol, Total: 240 mg/dL — ABNORMAL HIGH (ref 100–199)
HDL: 95 mg/dL (ref >39)
LDL Chol Calc (NIH): 126 mg/dL — ABNORMAL HIGH (ref 0–99)
Triglycerides: 112 mg/dL (ref 0–149)
VLDL Cholesterol Cal: 19 mg/dL (ref 5–40)

## 2023-07-19 LAB — BASIC METABOLIC PANEL WITH GFR
BUN/Creatinine Ratio: 18 (ref 9–20)
BUN: 23 mg/dL (ref 6–24)
CO2: 20 mmol/L (ref 20–29)
Calcium: 9.7 mg/dL (ref 8.7–10.2)
Chloride: 92 mmol/L — ABNORMAL LOW (ref 96–106)
Creatinine, Ser: 1.3 mg/dL — ABNORMAL HIGH (ref 0.76–1.27)
Glucose: 192 mg/dL — ABNORMAL HIGH (ref 70–99)
Potassium: 4.7 mmol/L (ref 3.5–5.2)
Sodium: 130 mmol/L — ABNORMAL LOW (ref 134–144)
eGFR: 64 mL/min/{1.73_m2} (ref >59)

## 2023-07-21 ENCOUNTER — Other Ambulatory Visit (HOSPITAL_COMMUNITY): Payer: Self-pay

## 2023-07-23 ENCOUNTER — Other Ambulatory Visit (HOSPITAL_COMMUNITY): Payer: Self-pay

## 2023-07-23 ENCOUNTER — Other Ambulatory Visit: Payer: Self-pay

## 2023-07-24 ENCOUNTER — Other Ambulatory Visit: Payer: Self-pay

## 2023-07-24 ENCOUNTER — Other Ambulatory Visit (HOSPITAL_COMMUNITY): Payer: Self-pay

## 2023-07-24 ENCOUNTER — Ambulatory Visit (INDEPENDENT_AMBULATORY_CARE_PROVIDER_SITE_OTHER): Admitting: Nurse Practitioner

## 2023-07-24 ENCOUNTER — Encounter: Payer: Self-pay | Admitting: Nurse Practitioner

## 2023-07-24 VITALS — BP 150/88 | HR 71 | Ht 70.0 in | Wt 180.8 lb

## 2023-07-24 DIAGNOSIS — E109 Type 1 diabetes mellitus without complications: Secondary | ICD-10-CM | POA: Diagnosis not present

## 2023-07-24 DIAGNOSIS — I10 Essential (primary) hypertension: Secondary | ICD-10-CM

## 2023-07-24 DIAGNOSIS — E782 Mixed hyperlipidemia: Secondary | ICD-10-CM | POA: Diagnosis not present

## 2023-07-24 DIAGNOSIS — E559 Vitamin D deficiency, unspecified: Secondary | ICD-10-CM

## 2023-07-24 DIAGNOSIS — Z794 Long term (current) use of insulin: Secondary | ICD-10-CM

## 2023-07-24 LAB — POCT GLYCOSYLATED HEMOGLOBIN (HGB A1C): Hemoglobin A1C: 8.1 % — AB (ref 4.0–5.6)

## 2023-07-24 LAB — POCT UA - MICROALBUMIN
Albumin/Creatinine Ratio, Urine, POC: >300
Creatinine, POC: 100 mg/dL
Microalbumin Ur, POC: 150 mg/L

## 2023-07-24 NOTE — Progress Notes (Signed)
 Endocrinology Follow Up Note       07/24/2023, 8:43 AM   Subjective:    Patient ID: [name], male    DOB: 06-May-1965.  [name] is being seen in follow up after being seen in consultation for management of currently uncontrolled symptomatic diabetes requested by  Bevely Doffing, FNP.   Past Medical History:  Diagnosis Date   Allergy    Anemia    Diabetes mellitus without complication (HCC)    type 1 per patient   GERD (gastroesophageal reflux disease)    HLD (hyperlipidemia)    Hypertension     Past Surgical History:  Procedure Laterality Date   ANKLE SURGERY Right    LUMBAR LAMINECTOMY/DECOMPRESSION MICRODISCECTOMY N/A 10/07/2021   Procedure: Microlumbar decompression L4-5, possible L5-S1;  Surgeon: Duwayne Purchase, MD;  Location: [facility] OR;  Service: Orthopedics;  Laterality: N/A;   UPPER GASTROINTESTINAL ENDOSCOPY     VASECTOMY  1994    Social History   Socioeconomic History   Marital status: Married    Spouse name: Not on file   Number of children: 1   Years of education: Not on file   Highest education level: Associate degree: occupational, Scientist, product/process development, or vocational program  Occupational History   Occupation: Holiday representative  Tobacco Use   Smoking status: Every Day    Current packs/day: 1.00    Average packs/day: 1 pack/day for 30.0 years (30.0 ttl pk-yrs)    Types: Cigarettes   Smokeless tobacco: Never  Vaping Use   Vaping status: Never Used  Substance and Sexual Activity   Alcohol use: Not Currently    Alcohol/week: 0.0 - 8.0 standard drinks of alcohol   Drug use: No   Sexual activity: Yes  Other Topics Concern   Not on file  Social History Narrative   Not on file   Social Drivers of Health   Financial Resource Strain: High Risk (09/11/2022)   Overall Financial Resource Strain (CARDIA)    Difficulty of Paying Living Expenses: Hard  Food Insecurity: Patient Declined (09/11/2022)    Hunger Vital Sign    Worried About Running Out of Food in the Last Year: Patient declined    Ran Out of Food in the Last Year: Patient declined  Transportation Needs: No Transportation Needs (09/11/2022)   PRAPARE - Administrator, Civil Service (Medical): No    Lack of Transportation (Non-Medical): No  Physical Activity: Not on file  Stress: Not on file  Social Connections: Unknown (09/11/2022)   Social Connection and Isolation Panel    Frequency of Communication with Friends and Family: Not on file    Frequency of Social Gatherings with Friends and Family: Not on file    Attends Religious Services: Not on file    Active Member of Clubs or Organizations: No    Attends Banker Meetings: Not on file    Marital Status: Married    Family History  Problem Relation Age of Onset   COPD Mother    Diabetes Mother    Early death Father 67       MI   Heart attack Father    Lymphoma Sister 32   Hypertension Brother  Diabetes Maternal Grandmother    Stroke Neg Hx    Alcohol abuse Neg Hx    Drug abuse Neg Hx    Hyperlipidemia Neg Hx    Kidney disease Neg Hx    Colon cancer Neg Hx    Esophageal cancer Neg Hx    Stomach cancer Neg Hx    Rectal cancer Neg Hx     Outpatient Encounter Medications as of 07/24/2023  Medication Sig   amLODipine  (NORVASC ) 5 MG tablet TAKE 1 TABLET(5 MG) BY MOUTH DAILY   amLODipine  (NORVASC ) 5 MG tablet Take 1 tablet (5 mg total) by mouth daily.   chlorthalidone  (HYGROTON ) 25 MG tablet Take 1 tablet (25 mg total) by mouth daily.   Continuous Glucose Sensor (DEXCOM G7 SENSOR) MISC Use as directed and change every 10 days.   Continuous Glucose Transmitter (DEXCOM G6 TRANSMITTER) MISC Use to monitor sugar changes.   esomeprazole  (NEXIUM ) 40 MG capsule TAKE 1 CAPSULE BY MOUTH DAILY   Evolocumab  (REPATHA ) 140 MG/ML SOSY Inject 140 mg into the skin every 14 (fourteen) days.   Insulin  Disposable Pump (OMNIPOD 5 DEXG7G6 PODS GEN 5) MISC  CHANGE POD EVERY 48 TO 72 HOURS   Insulin  Disposable Pump (OMNIPOD 5 DEXG7G6 PODS GEN 5) MISC Change pod every 48 to 72 hours   Insulin  Disposable Pump (OMNIPOD 5 G6 INTRO, GEN 5,) KIT Change pod every 48-72 hrs   insulin  lispro (HUMALOG ) 100 UNIT/ML injection Use with Omnipod for TDD around 50 units per day   insulin  lispro (HUMALOG ) 100 UNIT/ML injection Use with Omnipod for total daily dose of 50 units per day.   olmesartan  (BENICAR ) 40 MG tablet TAKE 1 TABLET(40 MG) BY MOUTH DAILY   triamcinolone  cream (KENALOG ) 0.1 % Apply topically 2 (two) times daily.   chlorthalidone  (HYGROTON ) 25 MG tablet TAKE 1 TABLET(25 MG) BY MOUTH DAILY   esomeprazole  (NEXIUM ) 40 MG capsule Take 1 capsule (40 mg total) by mouth daily.   Evolocumab  (REPATHA ) 140 MG/ML SOSY Inject 140 mg into the skin every 14 (fourteen) days.   No facility-administered encounter medications on file as of 07/24/2023.    ALLERGIES: Allergies  Allergen Reactions   Crestor  [Rosuvastatin  Calcium ] Other (See Comments)    Muscle aches   Iron Other (See Comments)    Tore up my stomach  Liquid Iron   Lipitor [Atorvastatin  Calcium ] Other (See Comments)    Muscles aches   Statins Other (See Comments)    Muscle aches    VACCINATION STATUS: Immunization History  Administered Date(s) Administered   Influenza, Seasonal, Injecte, Preservative Fre 10/18/2022   Influenza,inj,Quad PF,6+ Mos 01/18/2016, 10/17/2017, 11/06/2019, 10/28/2020   Influenza,inj,quad, With Preservative 10/30/2018   Influenza-Unspecified 10/17/2017, 10/10/2021   PFIZER(Purple Top)SARS-COV-2 Vaccination 04/10/2019, 05/05/2019   Pneumococcal Polysaccharide-23 10/21/2014   Tdap 06/02/2013    Diabetes He presents for his follow-up diabetic visit. He has type 1 diabetes mellitus. Onset time: Diagnosed with LADA at age of 58. His disease course has been fluctuating. There are no hypoglycemic associated symptoms. Associated symptoms include blurred vision and  fatigue. There are no hypoglycemic complications. Symptoms are stable. There are no diabetic complications. Risk factors for coronary artery disease include diabetes mellitus, dyslipidemia, family history, male sex and tobacco exposure. Current diabetic treatment includes insulin  pump. He is compliant with treatment most of the time. His weight is fluctuating minimally. He is following a generally healthy diet. When asked about meal planning, he reported none. He has not had a previous visit with a dietitian.  He participates in exercise daily. His home blood glucose trend is decreasing steadily. His overall blood glucose range is 180-200 mg/dl. (He presents today with his CGM and pump showing stable glycemic profile with at target fasting readings and above target postprandial readings.  His POCT A1c today is 8.1%, unchanged from last visit.   Analysis of his CGM shows TIR 55%, TAR 45%, TBR 0%.  He also notes connection issues with his G7 and pods still.  He has been inputting carbs more but still has room for improvement there too.) An ACE inhibitor/angiotensin II receptor blocker is being taken. He does not see a podiatrist.Eye exam is not current.    Review of systems  Constitutional: + Minimally fluctuating body weight,  current Body mass index is 25.94 kg/m. , no fatigue, no subjective hyperthermia, no subjective hypothermia Eyes: no blurry vision, no xerophthalmia ENT: no sore throat, no nodules palpated in throat, no dysphagia/odynophagia, no hoarseness Cardiovascular: no chest pain, no shortness of breath, no palpitations, + intermittent leg swelling Respiratory: no cough, no shortness of breath Gastrointestinal: no nausea/vomiting/diarrhea Musculoskeletal: no muscle/joint aches Skin: no rashes, no hyperemia Neurological: no tremors, no numbness, no tingling, no dizziness Psychiatric: no depression, no anxiety  Objective:     BP (!) 150/88 (BP Location: Right Arm, Patient Position:  Sitting, Cuff Size: Large) Comment: Retake BP. Patient shares that he is on a new BP medication , just started.  Pulse 71   Ht 5' 10 (1.778 m)   Wt 180 lb 12.8 oz (82 kg)   BMI 25.94 kg/m   Wt Readings from Last 3 Encounters:  07/24/23 180 lb 12.8 oz (82 kg)  07/17/23 180 lb 1.9 oz (81.7 kg)  03/21/23 180 lb 3.2 oz (81.7 kg)     BP Readings from Last 3 Encounters:  07/24/23 (!) 150/88  07/17/23 (!) 150/90  03/21/23 (!) 140/90      Physical Exam- Limited  Constitutional:  Body mass index is 25.94 kg/m. , not in acute distress, normal state of mind Eyes:  EOMI, no exophthalmos Musculoskeletal: no gross deformities, strength intact in all four extremities, no gross restriction of joint movements Skin:  no rashes, no hyperemia Neurological: no tremor with outstretched hands   Diabetic Foot Exam - Simple   No data filed     CMP ( most recent) CMP     Component Value Date/Time   NA 130 (L) 07/18/2023 0802   K 4.7 07/18/2023 0802   CL 92 (L) 07/18/2023 0802   CO2 20 07/18/2023 0802   GLUCOSE 192 (H) 07/18/2023 0802   GLUCOSE 310 (H) 10/08/2021 1024   BUN 23 07/18/2023 0802   CREATININE 1.30 (H) 07/18/2023 0802   CALCIUM  9.7 07/18/2023 0802   PROT 6.4 03/24/2022 1014   ALBUMIN 3.7 (L) 03/24/2022 1014   AST 37 03/24/2022 1014   ALT 27 03/24/2022 1014   ALKPHOS 143 (H) 03/24/2022 1014   BILITOT 0.2 03/24/2022 1014   GFRNONAA >60 10/08/2021 1024   GFRAA >60 10/10/2017 0542     Diabetic Labs (most recent): Lab Results  Component Value Date   HGBA1C 8.1 (A) 07/24/2023   HGBA1C 8.1 (A) 03/21/2023   HGBA1C 7.7 (A) 11/21/2022   MICROALBUR 150 mg/L 07/24/2023   MICROALBUR 150 mg/mL 07/27/2022   MICROALBUR 150 06/20/2021     Lipid Panel ( most recent) Lipid Panel     Component Value Date/Time   CHOL 240 (H) 07/18/2023 0802   TRIG 112 07/18/2023 0802  HDL 95 07/18/2023 0802   CHOLHDL 2.5 07/18/2023 0802   CHOLHDL 3 10/28/2020 0943   VLDL 15.4 10/28/2020  0943   LDLCALC 126 (H) 07/18/2023 0802   LDLDIRECT 156.0 01/18/2016 1020   LABVLDL 19 07/18/2023 0802      Lab Results  Component Value Date   TSH 2.010 02/21/2022   TSH 1.78 02/02/2021   TSH 1.65 10/28/2020   TSH 2.15 01/18/2016   FREET4 1.03 02/21/2022           Assessment & Plan:   1) Type 1 diabetes mellitus without complication (HCC)  He presents today with his CGM and pump showing stable glycemic profile with at target fasting readings and above target postprandial readings.  His POCT A1c today is 8.1%, unchanged from last visit.   Analysis of his CGM shows TIR 55%, TAR 45%, TBR 0%.  He also notes connection issues with his G7 and pods still.  He has been inputting carbs more but still has room for improvement there too.  - [name] has currently uncontrolled symptomatic LADA DM since 58 years of age.   -Recent labs reviewed.  POCT UM shows significant proteinuria.  - I had a long discussion with him about the progressive nature of diabetes and the pathology behind its complications. -his diabetes is complicated by drastic fluctuations and severe hypoglycemia and he remains at a high risk for more acute and chronic complications which include CAD, CVA, CKD, retinopathy, and neuropathy. These are all discussed in detail with him.  The following Lifestyle Medicine recommendations according to American College of Lifestyle Medicine [facility]) were discussed and offered to patient and he agrees to start the journey:  A. Whole Foods, Plant-based plate comprising of fruits and vegetables, plant-based proteins, whole-grain carbohydrates was discussed in detail with the patient.   A list for source of those nutrients were also provided to the patient.  Patient will use only water or unsweetened tea for hydration. B.  The need to stay away from risky substances including alcohol, smoking; obtaining 7 to 9 hours of restorative sleep, at least 150 minutes of moderate intensity exercise  weekly, the importance of healthy social connections,  and stress reduction techniques were discussed. C.  A full color page of  Calorie density of various food groups per pound showing examples of each food groups was provided to the patient.  - Nutritional counseling repeated at each appointment due to patients tendency to fall back in to old habits.  - The patient admits there is a room for improvement in their diet and drink choices. -  Suggestion is made for the patient to avoid simple carbohydrates from their diet including Cakes, Sweet Desserts / Pastries, Ice Cream, Soda (diet and regular), Sweet Tea, Candies, Chips, Cookies, Sweet Pastries, Store Bought Juices, Alcohol in Excess of 1-2 drinks a day, Artificial Sweeteners, Coffee Creamer, and Sugar-free Products. This will help patient to have stable blood glucose profile and potentially avoid unintended weight gain.   - I encouraged the patient to switch to unprocessed or minimally processed complex starch and increased protein intake (animal or plant source), fruits, and vegetables.   - Patient is advised to stick to a routine mealtimes to eat 3 meals a day and avoid unnecessary snacks (to snack only to correct hypoglycemia).  - I have approached him with the following individualized plan to manage his diabetes and patient agrees:   -I did adjust his insulin  correction factor from 60 to 40 today to  help drive down after bolus readings.  We discussed the importance of bolusing before meals AND snacks, which he will work on trying to do more consistently.   -he is encouraged to continue monitoring glucose 4 times daily (using his CGM), before meals and before bed, and to call the clinic if he has readings less than 70 or above 300 for 3 tests in a row.    - he is warned not to take insulin  without proper monitoring per orders. - Adjustment parameters are given to him for hypo and hyperglycemia in writing.  - his Metformin  was  discontinued, risk outweighs benefit for this patient, given his type 1 diagnosis where insulin  treatment is necessary.  - Specific targets for  A1c; LDL, HDL, and Triglycerides were discussed with the patient.  2) Blood Pressure /Hypertension:  his blood pressure is above target but he notes he just took his medications on the drive here and meds were just recently adjusted by his PCP.   he is advised to continue his current medications as prescribed by PCP.  WE did talk about potential of sending him to a nephrologist for their assistance in management, especially since he still has significant proteinuria.  3) Lipids/Hyperlipidemia:    Review of his recent lipid panel from 07/18/23 showed uncontrolled LDL at 126. He is allergic to statin medications, is on Repatha .  He is advised to avoid fried foods and butter.  4)  Weight/Diet:  his Body mass index is 25.94 kg/m.  -    he is NOT a candidate for weight loss.   Exercise, and detailed carbohydrates information provided  -  detailed on discharge instructions.  5) Chronic Care/Health Maintenance: -he is on ACEI/ARB and not on Statin medications and is encouraged to initiate and continue to follow up with Ophthalmology, Dentist, Podiatrist at least yearly or according to recommendations, and advised to QUIT SMOKING. I have recommended yearly flu vaccine and pneumonia vaccine at least every 5 years; moderate intensity exercise for up to 150 minutes weekly; and sleep for at least 7 hours a day.  - he is advised to maintain close follow up with Bevely Doffing, FNP for primary care needs, as well as his other providers for optimal and coordinated care.      I spent  46  minutes in the care of the patient today including review of labs from CMP, Lipids, Thyroid  Function, Hematology (current and previous including abstractions from other facilities); face-to-face time discussing  his blood glucose readings/logs, discussing hypoglycemia and  hyperglycemia episodes and symptoms, medications doses, his options of short and long term treatment based on the latest standards of care / guidelines;  discussion about incorporating lifestyle medicine;  and documenting the encounter. Risk reduction counseling performed per USPSTF guidelines to reduce obesity and cardiovascular risk factors.     Please refer to Patient Instructions for Blood Glucose Monitoring and Insulin /Medications Dosing Guide  in media tab for additional information. Please  also refer to  Patient Self Inventory in the Media  tab for reviewed elements of pertinent patient history.  [name] participated in the discussions, expressed understanding, and voiced agreement with the above plans.  All questions were answered to his satisfaction. he is encouraged to contact clinic should he have any questions or concerns prior to his return visit.     Follow up plan: - Return in about 4 months (around 11/24/2023) for Diabetes F/U with A1c in office, No previsit labs, Bring meter and logs.  [name], [facility] [address] [place], [place] [address] Phone: [phone] Fax: [phone]  07/24/2023, 8:43 AM

## 2023-07-25 ENCOUNTER — Other Ambulatory Visit (HOSPITAL_COMMUNITY): Payer: Self-pay

## 2023-07-30 ENCOUNTER — Encounter: Payer: Self-pay | Admitting: Pharmacist

## 2023-07-30 ENCOUNTER — Other Ambulatory Visit: Payer: Self-pay

## 2023-07-30 ENCOUNTER — Other Ambulatory Visit (HOSPITAL_COMMUNITY): Payer: Self-pay

## 2023-08-02 ENCOUNTER — Other Ambulatory Visit: Payer: Self-pay

## 2023-08-10 ENCOUNTER — Other Ambulatory Visit: Payer: Self-pay

## 2023-08-10 ENCOUNTER — Other Ambulatory Visit (HOSPITAL_BASED_OUTPATIENT_CLINIC_OR_DEPARTMENT_OTHER): Payer: Self-pay

## 2023-08-10 ENCOUNTER — Other Ambulatory Visit (HOSPITAL_COMMUNITY): Payer: Self-pay

## 2023-08-12 ENCOUNTER — Ambulatory Visit: Payer: Self-pay

## 2023-08-15 ENCOUNTER — Other Ambulatory Visit (HOSPITAL_COMMUNITY): Payer: Self-pay

## 2023-08-20 ENCOUNTER — Other Ambulatory Visit: Payer: Self-pay

## 2023-08-20 ENCOUNTER — Other Ambulatory Visit (HOSPITAL_BASED_OUTPATIENT_CLINIC_OR_DEPARTMENT_OTHER): Payer: Self-pay

## 2023-08-20 ENCOUNTER — Other Ambulatory Visit (HOSPITAL_COMMUNITY): Payer: Self-pay

## 2023-08-21 ENCOUNTER — Encounter: Payer: Self-pay | Admitting: Pharmacist

## 2023-08-21 ENCOUNTER — Other Ambulatory Visit: Payer: Self-pay

## 2023-08-23 ENCOUNTER — Other Ambulatory Visit (HOSPITAL_COMMUNITY): Payer: Self-pay

## 2023-08-23 ENCOUNTER — Encounter: Payer: Self-pay | Admitting: Pharmacist

## 2023-08-23 ENCOUNTER — Other Ambulatory Visit: Payer: Self-pay

## 2023-08-24 ENCOUNTER — Other Ambulatory Visit: Payer: Self-pay

## 2023-08-27 ENCOUNTER — Other Ambulatory Visit: Payer: Self-pay | Admitting: Internal Medicine

## 2023-08-27 ENCOUNTER — Other Ambulatory Visit (HOSPITAL_COMMUNITY): Payer: Self-pay

## 2023-08-27 ENCOUNTER — Other Ambulatory Visit: Payer: Self-pay

## 2023-08-27 DIAGNOSIS — E1159 Type 2 diabetes mellitus with other circulatory complications: Secondary | ICD-10-CM

## 2023-08-27 MED ORDER — OLMESARTAN MEDOXOMIL 40 MG PO TABS: 40.0000 mg | ORAL_TABLET | Freq: Every day | ORAL | 0 refills | Status: DC

## 2023-08-27 MED ORDER — REPATHA 140 MG/ML ~~LOC~~ SOSY
140.0000 mg | PREFILLED_SYRINGE | SUBCUTANEOUS | 0 refills | Status: DC
  Filled 2023-08-27 – 2023-09-21 (×2): qty 2, 28d supply, fill #0

## 2023-08-27 NOTE — Telephone Encounter (Signed)
 Olmesartan  sent to pharmacy.

## 2023-09-03 ENCOUNTER — Other Ambulatory Visit: Payer: Self-pay | Admitting: Internal Medicine

## 2023-09-06 ENCOUNTER — Ambulatory Visit
Admission: EM | Admit: 2023-09-06 | Discharge: 2023-09-06 | Disposition: A | Discharge status: Home / Self Care | Attending: Family Medicine | Admitting: Family Medicine

## 2023-09-06 ENCOUNTER — Ambulatory Visit: Payer: Self-pay

## 2023-09-06 ENCOUNTER — Encounter: Payer: Self-pay | Admitting: Emergency Medicine

## 2023-09-06 ENCOUNTER — Other Ambulatory Visit: Payer: Self-pay | Admitting: Internal Medicine

## 2023-09-06 ENCOUNTER — Other Ambulatory Visit: Payer: Self-pay

## 2023-09-06 DIAGNOSIS — R197 Diarrhea, unspecified: Secondary | ICD-10-CM

## 2023-09-06 DIAGNOSIS — R109 Unspecified abdominal pain: Secondary | ICD-10-CM

## 2023-09-06 NOTE — ED Triage Notes (Signed)
 Diarrhea since Saturday. C/o of abd cramping in mid abd when eating.  Feels nauseated at times after eating.

## 2023-09-06 NOTE — ED Provider Notes (Signed)
 [facility]    CSN: [id number] Arrival date & time: 09/06/23  9045      History   Chief Complaint Chief Complaint  Patient presents with   stomach cramping   Diarrhea    HPI [name] is a 58 y.o. male.   Patient presenting today with about 5 to 6-day history of diarrhea, lower abdominal cramping, occasional nausea after eating.  Denies significant abdominal pain, fever, chills, vomiting, hematemesis, melena, hematochezia, mucus in stools, new foods or medications recently.  So far trying Imodium and Pepto with mild temporary benefit.  Leaves for a cruise tomorrow per patient.     Past Medical History:  Diagnosis Date   Allergy    Anemia    Diabetes mellitus without complication (HCC)    type 1 per patient   GERD (gastroesophageal reflux disease)    HLD (hyperlipidemia)    Hypertension     Patient Active Problem List   Diagnosis Date Noted   Acute upper respiratory infection 12/29/2022   Colon cancer screening 10/18/2022   Hyponatremia 10/18/2022   Need for influenza vaccination 10/18/2022   Bilateral hearing loss 10/18/2022   Rash in adult 06/27/2022   Contact dermatitis 05/03/2022   Elevated alkaline phosphatase level 03/21/2022   Hyperkalemia 03/21/2022   Tobacco use 02/23/2022   Encounter for general adult medical examination with abnormal findings 02/23/2022   HNP (herniated nucleus pulposus), lumbar 10/07/2021   Vitamin D  insufficiency 04/25/2021   Nicotine  dependence with current use 10/28/2020   Gastrointestinal hemorrhage 10/18/2017   Leg edema 08/31/2017   Erectile dysfunction 05/14/2017   Carpal tunnel syndrome 01/04/2017   Hypertension associated with diabetes (HCC) 01/04/2017   Iron deficiency anemia due to chronic blood loss 01/20/2016   Diabetes mellitus type 1 (HCC) 01/18/2016   Hyperlipidemia due to T1DM (HCC) - Statin Intolerant 01/18/2016   Gastroesophageal reflux disease without esophagitis 01/18/2016    Past Surgical  History:  Procedure Laterality Date   ANKLE SURGERY Right    LUMBAR LAMINECTOMY/DECOMPRESSION MICRODISCECTOMY N/A 10/07/2021   Procedure: Microlumbar decompression L4-5, possible L5-S1;  Surgeon: Duwayne Purchase, MD;  Location: [facility] OR;  Service: Orthopedics;  Laterality: N/A;   UPPER GASTROINTESTINAL ENDOSCOPY     VASECTOMY  1994       Home Medications    Prior to Admission medications   Medication Sig Start Date End Date Taking? Authorizing Provider  amLODipine  (NORVASC ) 5 MG tablet TAKE 1 TABLET(5 MG) BY MOUTH DAILY 12/07/22   Melvenia Manus BRAVO, MD  amLODipine  (NORVASC ) 5 MG tablet Take 1 tablet (5 mg total) by mouth daily. 06/05/23   Melvenia Manus BRAVO, MD  chlorthalidone  (HYGROTON ) 25 MG tablet TAKE 1 TABLET(25 MG) BY MOUTH DAILY 06/06/23   Melvenia Manus BRAVO, MD  chlorthalidone  (HYGROTON ) 25 MG tablet Take 1 tablet (25 mg total) by mouth daily. 06/06/23   Melvenia Manus BRAVO, MD  Continuous Glucose Sensor (DEXCOM G7 SENSOR) MISC Use as directed and change every 10 days. 04/16/23   Therisa Benton PARAS, NP  esomeprazole  (NEXIUM ) 40 MG capsule TAKE 1 CAPSULE BY MOUTH DAILY 10/19/22   Melvenia Manus BRAVO, MD  esomeprazole  (NEXIUM ) 40 MG capsule Take 1 capsule (40 mg total) by mouth daily. 07/01/23   Melvenia Manus BRAVO, MD  Evolocumab  (REPATHA ) 140 MG/ML SOSY Inject 140 mg into the skin every 14 (fourteen) days. 07/17/23   Bevely Doffing, FNP  Evolocumab  (REPATHA ) 140 MG/ML SOSY Inject 140 mg into the skin every 14 (fourteen) days. 08/27/23  Bevely Doffing, FNP  Insulin  Disposable Pump (OMNIPOD 5 DEXG7G6 PODS GEN 5) MISC CHANGE POD EVERY 48 TO 72 HOURS 03/21/23   Therisa Benton PARAS, NP  Insulin  Disposable Pump (OMNIPOD 5 DEXG7G6 PODS GEN 5) MISC Change pod every 48 to 72 hours 05/30/23   Therisa Benton PARAS, NP  Insulin  Disposable Pump (OMNIPOD 5 G6 INTRO, GEN 5,) KIT Change pod every 48-72 hrs 06/07/21   Therisa Benton PARAS, NP  insulin  lispro (HUMALOG ) 100 UNIT/ML injection Use with Omnipod for TDD around 50 units  per day 11/21/22   Therisa Benton PARAS, NP  insulin  lispro (HUMALOG ) 100 UNIT/ML injection Use with Omnipod for total daily dose of 50 units per day. 04/16/23   Therisa Benton PARAS, NP  olmesartan  (BENICAR ) 40 MG tablet Take 1 tablet (40 mg total) by mouth daily. 08/27/23   Bevely Doffing, FNP  triamcinolone  cream (KENALOG ) 0.1 % Apply topically 2 (two) times daily. 07/17/23   Bevely Doffing, FNP    Family History Family History  Problem Relation Age of Onset   COPD Mother    Diabetes Mother    Early death Father 76       MI   Heart attack Father    Lymphoma Sister 48   Hypertension Brother    Diabetes Maternal Grandmother    Stroke Neg Hx    Alcohol abuse Neg Hx    Drug abuse Neg Hx    Hyperlipidemia Neg Hx    Kidney disease Neg Hx    Colon cancer Neg Hx    Esophageal cancer Neg Hx    Stomach cancer Neg Hx    Rectal cancer Neg Hx     Social History Social History   Tobacco Use   Smoking status: Every Day    Current packs/day: 1.00    Average packs/day: 1 pack/day for 30.0 years (30.0 ttl pk-yrs)    Types: Cigarettes   Smokeless tobacco: Never  Vaping Use   Vaping status: Never Used  Substance Use Topics   Alcohol use: Not Currently    Alcohol/week: 0.0 - 8.0 standard drinks of alcohol   Drug use: No     Allergies   Crestor  [rosuvastatin  calcium ], Iron, Lipitor [atorvastatin  calcium ], and Statins   Review of Systems Review of Systems Per HPI  Physical Exam Triage Vital Signs ED Triage Vitals  Encounter Vitals Group     BP 09/06/23 1010 138/79     Girls Systolic BP Percentile --      Girls Diastolic BP Percentile --      Boys Systolic BP Percentile --      Boys Diastolic BP Percentile --      Pulse Rate 09/06/23 1010 93     Resp 09/06/23 1010 18     Temp 09/06/23 1010 98.1 F (36.7 C)     Temp Source 09/06/23 1010 Oral     SpO2 09/06/23 1010 96 %     Weight --      Height --      Head Circumference --      Peak Flow --      Pain Score 09/06/23 1011 0      Pain Loc --      Pain Education --      Exclude from Growth Chart --    No data found.  Updated Vital Signs BP 138/79 (BP Location: Right Arm)   Pulse 93   Temp 98.1 F (36.7 C) (Oral)   Resp 18   SpO2 96%  Visual Acuity Right Eye Distance:   Left Eye Distance:   Bilateral Distance:    Right Eye Near:   Left Eye Near:    Bilateral Near:     Physical Exam Vitals and nursing note reviewed.  Constitutional:      Appearance: Normal appearance.  HENT:     Head: Atraumatic.  Eyes:     Extraocular Movements: Extraocular movements intact.     Conjunctiva/sclera: Conjunctivae normal.  Cardiovascular:     Rate and Rhythm: Normal rate.  Pulmonary:     Effort: Pulmonary effort is normal.     Breath sounds: Normal breath sounds.  Abdominal:     General: Bowel sounds are normal. There is no distension.     Palpations: Abdomen is soft.     Tenderness: There is no abdominal tenderness. There is no right CVA tenderness, left CVA tenderness or guarding.  Musculoskeletal:        General: Normal range of motion.     Cervical back: Normal range of motion and neck supple.  Skin:    General: Skin is warm and dry.  Neurological:     Mental Status: He is oriented to person, place, and time.  Psychiatric:        Mood and Affect: Mood normal.        Thought Content: Thought content normal.        Judgment: Judgment normal.      UC Treatments / Results  Labs (all labs ordered are listed, but only abnormal results are displayed) Labs Reviewed  GASTROINTESTINAL PANEL BY PCR, STOOL (REPLACES STOOL CULTURE)  CBC WITH DIFFERENTIAL/PLATELET  BASIC METABOLIC PANEL WITH GFR    EKG   Radiology No results found.  Procedures Procedures (including critical care time)  Medications Ordered in UC Medications - No data to display  Initial Impression / Assessment and Plan / UC Course  I have reviewed the triage vital signs and the nursing notes.  Pertinent labs & imaging  results that were available during my care of the patient were reviewed by me and considered in my medical decision making (see chart for details).     Given his upcoming travel, he is agreeable to stool testing, labs for further evaluation.  Suspect viral GI illness, but will adjust treatment based on upcoming results.  Return for worsening symptoms.  Final Clinical Impressions(s) / UC Diagnoses   Final diagnoses:  Diarrhea, unspecified type  Abdominal cramping     Discharge Instructions      Continue Imodium, add electrolyte solutions in addition to plenty of plain water, bland foods.  We have obtained some labs and stool testing and we will let you know if any of the results come back abnormal.  Follow-up in the emergency department for significantly worsening symptoms at any time.    ED Prescriptions   None    PDMP not reviewed this encounter.   Stuart Vernell Norris, [place] 09/06/23 1945

## 2023-09-06 NOTE — Discharge Instructions (Signed)
 Continue Imodium, add electrolyte solutions in addition to plenty of plain water, bland foods.  We have obtained some labs and stool testing and we will let you know if any of the results come back abnormal.  Follow-up in the emergency department for significantly worsening symptoms at any time.

## 2023-09-06 NOTE — Telephone Encounter (Signed)
 Copied from CRM [id number]. Topic: Clinical - Medication Refill >> Sep 06, 2023 12:44 PM Montie POUR wrote: Medication: esomeprazole  (NEXIUM ) 40 MG capsule - 90 day supply He has always taken 1 capsule in the morning and 1 capsule in the evening. He is going out of state tomorrow at 5 PM  Has the patient contacted their pharmacy? Yes (Agent: If no, request that the patient contact the pharmacy for the refill. If patient does not wish to contact the pharmacy document the reason why and proceed with request.) (Agent: If yes, when and what did the pharmacy advise?) Pharmacy needs order to refill  This is the patient's preferred pharmacy:  [facility] [id number] - [place], [place] - [address] AT [facility] [address] [place] [address] Phone: [phone] Fax: [phone]  Is this the correct pharmacy for this prescription? Yes If no, delete pharmacy and type the correct one.   Has the prescription been filled recently? No  Is the patient out of the medication? Yes - He has been out for 3 weeks  Has the patient been seen for an appointment in the last year OR does the patient have an upcoming appointment? Yes  Can we respond through MyChart? Yes  Agent: Please be advised that Rx refills may take up to 3 business days. We ask that you follow-up with your pharmacy.

## 2023-09-06 NOTE — Telephone Encounter (Signed)
 FYI Only or Action Required?: FYI only for provider.  Patient was last seen in primary care on 07/17/2023 by Bevely Doffing, FNP.  Called Nurse Triage reporting Diarrhea.  Symptoms began several days ago.  Interventions attempted: OTC medications: Imodium, Pepto Bismol.  Symptoms are: unchanged.  Triage Disposition: See Physician Within 24 Hours  Patient/caregiver understands and will follow disposition?: Yes                     Copied from CRM #[id number]. Topic: Clinical - Red Word Triage >> Sep 06, 2023  9:15 AM Ivette P wrote: [facility] that prompted transfer to Nurse Triage: Diarrhea, 4th day and getting pretty bad, starting dehydrate. Reason for Disposition  [1] SEVERE diarrhea (e.g., 7 or more times / day more than normal) AND [2] present > 24 hours (1 day)  Answer Assessment - Initial Assessment Questions This RN recommends pt goes to an urgent care today as no appt availability in office and pt states he is leaving for a cruise tmrw night. Pt agreeable and is going to urgent care now.   DIARRHEA SEVERITY: How bad is the diarrhea? How many more stools have you had in the past 24 hours than normal?      6  ONSET: When did the diarrhea begin?      Saturday night and went away Sunday; it started back Sunday night  STOOL DESCRIPTION:  How loose or watery is the diarrhea? What is the stool color? Is there any blood or mucous in the stool?     Loose and watery, brown in color; awful smell; denies blood  VOMITING: Are you also vomiting? If Yes, ask: How many times in the past 24 hours?      No, pt feels nauseous after eating  ABDOMEN PAIN: Are you having any abdomen pain? If Yes, ask: What does it feel like? (e.g., crampy, dull, intermittent, constant)      Right after eating there are cramps and diarrhea starts  ORAL INTAKE: If vomiting, Have you been able to drink liquids? How much liquids have you had in the past 24 hours?     Pt  states he has been drinking a lot of water  HYDRATION: Any signs of dehydration? (e.g., dry mouth [not just dry lips], too weak to stand, dizziness, new weight loss) When did you last urinate?     Pt states he can tell he is weaker  EXPOSURE: Have you traveled to a foreign country recently? Have you been exposed to anyone with diarrhea? Could you have eaten any food that was spoiled?     No; pt states he ate [place] on Sat night but he states it was fresh  ANTIBIOTIC USE: Are you taking antibiotics now or have you taken antibiotics in the past 2 months?       No  OTHER SYMPTOMS: Do you have any other symptoms? (e.g., fever, blood in stool)       No  Pt states he is out of Nexium  and his insurance won't pay as it is not time for a refill. Pt states he has not had any in over a month. Pt states he has bought an OTC medication similar but it is not helping.   Pt has been taking imodium and Pepto Bismol but is not helping  Protocols used: Diarrhea-A-AH

## 2023-09-07 ENCOUNTER — Other Ambulatory Visit (HOSPITAL_COMMUNITY)
Admission: RE | Admit: 2023-09-07 | Discharge: 2023-09-07 | Disposition: A | Discharge status: Home / Self Care | Source: Ambulatory Visit | Attending: Family Medicine | Admitting: Family Medicine

## 2023-09-07 ENCOUNTER — Ambulatory Visit (HOSPITAL_COMMUNITY): Payer: Self-pay

## 2023-09-07 ENCOUNTER — Telehealth: Payer: Self-pay | Admitting: Family Medicine

## 2023-09-07 DIAGNOSIS — R197 Diarrhea, unspecified: Secondary | ICD-10-CM

## 2023-09-07 LAB — CBC WITH DIFFERENTIAL/PLATELET
Basophils Absolute: 0.1 x10E3/uL (ref 0.0–0.2)
Basos: 1 %
EOS (ABSOLUTE): 0.2 x10E3/uL (ref 0.0–0.4)
Eos: 2 %
Hematocrit: 39.8 % (ref 37.5–51.0)
Hemoglobin: 13.1 g/dL (ref 13.0–17.7)
Immature Grans (Abs): 0 x10E3/uL (ref 0.0–0.1)
Immature Granulocytes: 0 %
Lymphocytes Absolute: 0.6 x10E3/uL — ABNORMAL LOW (ref 0.7–3.1)
Lymphs: 6 %
MCH: 32.4 pg (ref 26.6–33.0)
MCHC: 32.9 g/dL (ref 31.5–35.7)
MCV: 99 fL — ABNORMAL HIGH (ref 79–97)
Monocytes Absolute: 0.8 x10E3/uL (ref 0.1–0.9)
Monocytes: 8 %
Neutrophils Absolute: 8.1 x10E3/uL — ABNORMAL HIGH (ref 1.4–7.0)
Neutrophils: 83 %
Platelets: 354 x10E3/uL (ref 150–450)
RBC: 4.04 x10E6/uL — ABNORMAL LOW (ref 4.14–5.80)
RDW: 12.6 % (ref 11.6–15.4)
WBC: 9.8 x10E3/uL (ref 3.4–10.8)

## 2023-09-07 LAB — BASIC METABOLIC PANEL WITH GFR
BUN/Creatinine Ratio: 15 (ref 9–20)
BUN: 20 mg/dL (ref 6–24)
CO2: 17 mmol/L — ABNORMAL LOW (ref 20–29)
Calcium: 8.2 mg/dL — ABNORMAL LOW (ref 8.7–10.2)
Chloride: 98 mmol/L (ref 96–106)
Creatinine, Ser: 1.37 mg/dL — ABNORMAL HIGH (ref 0.76–1.27)
Glucose: 253 mg/dL — ABNORMAL HIGH (ref 70–99)
Potassium: 4.2 mmol/L (ref 3.5–5.2)
Sodium: 128 mmol/L — ABNORMAL LOW (ref 134–144)
eGFR: 60 mL/min/1.73 (ref >59)

## 2023-09-07 LAB — C DIFFICILE QUICK SCREEN W PCR REFLEX
C Diff antigen: NEGATIVE
C Diff interpretation: NOT DETECTED
C Diff toxin: NEGATIVE

## 2023-09-07 NOTE — Telephone Encounter (Signed)
 GI panel and C. difficile testing replaced as lab did not receive orders

## 2023-09-08 LAB — GASTROINTESTINAL PANEL BY PCR, STOOL (REPLACES STOOL CULTURE)

## 2023-09-11 ENCOUNTER — Other Ambulatory Visit: Payer: Self-pay

## 2023-09-11 DIAGNOSIS — K219 Gastro-esophageal reflux disease without esophagitis: Secondary | ICD-10-CM

## 2023-09-11 MED ORDER — ESOMEPRAZOLE MAGNESIUM 40 MG PO CPDR: 40.0000 mg | DELAYED_RELEASE_CAPSULE | Freq: Two times a day (BID) | ORAL | 1 refills | Status: AC

## 2023-09-16 ENCOUNTER — Encounter: Payer: Self-pay | Admitting: Nurse Practitioner

## 2023-09-20 NOTE — Telephone Encounter (Signed)
 Scheduled appt with Neale Carpen to discuss blood pressure meds.

## 2023-09-21 ENCOUNTER — Other Ambulatory Visit (HOSPITAL_COMMUNITY): Payer: Self-pay

## 2023-09-21 ENCOUNTER — Encounter: Payer: Self-pay | Admitting: Nurse Practitioner

## 2023-09-21 ENCOUNTER — Ambulatory Visit: Admitting: Nurse Practitioner

## 2023-09-21 ENCOUNTER — Other Ambulatory Visit: Payer: Self-pay

## 2023-09-21 VITALS — BP 163/83 | HR 73 | Resp 16 | Ht 70.0 in | Wt 169.0 lb

## 2023-09-21 DIAGNOSIS — R1084 Generalized abdominal pain: Secondary | ICD-10-CM | POA: Diagnosis not present

## 2023-09-21 DIAGNOSIS — E782 Mixed hyperlipidemia: Secondary | ICD-10-CM

## 2023-09-21 DIAGNOSIS — R110 Nausea: Secondary | ICD-10-CM | POA: Diagnosis not present

## 2023-09-21 DIAGNOSIS — R5383 Other fatigue: Secondary | ICD-10-CM

## 2023-09-21 MED ORDER — ONDANSETRON HCL 4 MG PO TABS: 4.0000 mg | ORAL_TABLET | Freq: Three times a day (TID) | ORAL | 0 refills | Status: DC | PRN

## 2023-09-21 NOTE — Addendum Note (Signed)
 Addended by: Aaleah Hirsch H on: 09/21/2023 08:42 AM   Modules accepted: Orders

## 2023-09-21 NOTE — Progress Notes (Signed)
 Established Patient Office Visit  Subjective:  Patient ID: [name], male    DOB: Jan 07, 1966  Age: 58 y.o. MRN: [id number]  Chief Complaint  Patient presents with   Hypertension    Was admitted to the hospital in [place] 8/28 after collapsing and he stayed 4 days and his blood pressure meds were stopped.     Patient was on vacation out of country, patient had dehydration and diarrhea.  Was hospitalized.  Neg for C diff.  Patient agrees to have kidney function and iron checked along with electrolytes along with magnesium .    Hypertension    No other concerns at this time.   Past Medical History:  Diagnosis Date   Allergy    Anemia    Diabetes mellitus without complication (HCC)    type 1 per patient   GERD (gastroesophageal reflux disease)    HLD (hyperlipidemia)    Hypertension     Past Surgical History:  Procedure Laterality Date   ANKLE SURGERY Right    LUMBAR LAMINECTOMY/DECOMPRESSION MICRODISCECTOMY N/A 10/07/2021   Procedure: Microlumbar decompression L4-5, possible L5-S1;  Surgeon: Duwayne Purchase, MD;  Location: [facility] OR;  Service: Orthopedics;  Laterality: N/A;   UPPER GASTROINTESTINAL ENDOSCOPY     VASECTOMY  1994    Social History   Socioeconomic History   Marital status: Married    Spouse name: Not on file   Number of children: 1   Years of education: Not on file   Highest education level: Associate degree: occupational, Scientist, product/process development, or vocational program  Occupational History   Occupation: Holiday representative  Tobacco Use   Smoking status: Every Day    Current packs/day: 1.00    Average packs/day: 1 pack/day for 30.0 years (30.0 ttl pk-yrs)    Types: Cigarettes   Smokeless tobacco: Never  Vaping Use   Vaping status: Never Used  Substance and Sexual Activity   Alcohol use: Not Currently    Alcohol/week: 0.0 - 8.0 standard drinks of alcohol   Drug use: No   Sexual activity: Yes  Other Topics Concern   Not on file  Social History Narrative   Not on  file   Social Drivers of Health   Financial Resource Strain: High Risk (09/11/2022)   Overall Financial Resource Strain (CARDIA)    Difficulty of Paying Living Expenses: Hard  Food Insecurity: Patient Declined (09/11/2022)   Hunger Vital Sign    Worried About Running Out of Food in the Last Year: Patient declined    Ran Out of Food in the Last Year: Patient declined  Transportation Needs: No Transportation Needs (09/11/2022)   PRAPARE - Administrator, Civil Service (Medical): No    Lack of Transportation (Non-Medical): No  Physical Activity: Not on file  Stress: Not on file  Social Connections: Unknown (09/11/2022)   Social Connection and Isolation Panel    Frequency of Communication with Friends and Family: Not on file    Frequency of Social Gatherings with Friends and Family: Not on file    Attends Religious Services: Not on file    Active Member of Clubs or Organizations: No    Attends Banker Meetings: Not on file    Marital Status: Married  Catering manager Violence: Not on file    Family History  Problem Relation Age of Onset   COPD Mother    Diabetes Mother    Early death Father 33       MI   Heart attack Father  Lymphoma Sister 62   Hypertension Brother    Diabetes Maternal Grandmother    Stroke Neg Hx    Alcohol abuse Neg Hx    Drug abuse Neg Hx    Hyperlipidemia Neg Hx    Kidney disease Neg Hx    Colon cancer Neg Hx    Esophageal cancer Neg Hx    Stomach cancer Neg Hx    Rectal cancer Neg Hx     Allergies  Allergen Reactions   Crestor  [Rosuvastatin  Calcium ] Other (See Comments)    Muscle aches   Iron Other (See Comments)    Tore up my stomach  Liquid Iron   Lipitor [Atorvastatin  Calcium ] Other (See Comments)    Muscles aches   Statins Other (See Comments)    Muscle aches    Outpatient Medications Prior to Visit  Medication Sig   Continuous Glucose Sensor (DEXCOM G7 SENSOR) MISC Use as directed and change every 10  days.   esomeprazole  (NEXIUM ) 40 MG capsule Take 1 capsule (40 mg total) by mouth 2 (two) times daily before a meal.   Evolocumab  (REPATHA ) 140 MG/ML SOSY Inject 140 mg into the skin every 14 (fourteen) days.   Evolocumab  (REPATHA ) 140 MG/ML SOSY Inject 140 mg into the skin every 14 (fourteen) days.   Insulin  Disposable Pump (OMNIPOD 5 DEXG7G6 PODS GEN 5) MISC CHANGE POD EVERY 48 TO 72 HOURS   Insulin  Disposable Pump (OMNIPOD 5 DEXG7G6 PODS GEN 5) MISC Change pod every 48 to 72 hours   Insulin  Disposable Pump (OMNIPOD 5 G6 INTRO, GEN 5,) KIT Change pod every 48-72 hrs   insulin  lispro (HUMALOG ) 100 UNIT/ML injection Use with Omnipod for TDD around 50 units per day   insulin  lispro (HUMALOG ) 100 UNIT/ML injection Use with Omnipod for total daily dose of 50 units per day.   triamcinolone  cream (KENALOG ) 0.1 % Apply topically 2 (two) times daily.   amLODipine  (NORVASC ) 5 MG tablet TAKE 1 TABLET(5 MG) BY MOUTH DAILY (Patient not taking: Reported on 09/21/2023)   chlorthalidone  (HYGROTON ) 25 MG tablet Take 1 tablet (25 mg total) by mouth daily. (Patient not taking: Reported on 09/21/2023)   olmesartan  (BENICAR ) 40 MG tablet Take 1 tablet (40 mg total) by mouth daily. (Patient not taking: Reported on 09/21/2023)   [DISCONTINUED] amLODipine  (NORVASC ) 5 MG tablet Take 1 tablet (5 mg total) by mouth daily.   [DISCONTINUED] chlorthalidone  (HYGROTON ) 25 MG tablet TAKE 1 TABLET(25 MG) BY MOUTH DAILY   No facility-administered medications prior to visit.    ROS     Objective:   BP (!) 163/83   Pulse 73   Resp 16   Ht 5' 10 (1.778 m)   Wt 169 lb (76.7 kg)   SpO2 99%   BMI 24.25 kg/m   Vitals:   09/21/23 0811  BP: (!) 163/83  Pulse: 73  Resp: 16  Height: 5' 10 (1.778 m)  Weight: 169 lb (76.7 kg)  SpO2: 99%  BMI (Calculated): 24.25    Physical Exam Vitals and nursing note reviewed.  Constitutional:      Appearance: Normal appearance.  HENT:     Head: Normocephalic.     Nose: Nose  normal.     Mouth/Throat:     Mouth: Mucous membranes are dry.  Cardiovascular:     Rate and Rhythm: Normal rate and regular rhythm.     Pulses: Normal pulses.     Heart sounds: Normal heart sounds.  Pulmonary:     Effort: Pulmonary effort  is normal.     Breath sounds: Normal breath sounds.  Abdominal:     Palpations: Abdomen is soft.  Musculoskeletal:        General: Normal range of motion.     Cervical back: Normal range of motion and neck supple.  Skin:    General: Skin is warm and dry.  Neurological:     Mental Status: He is alert and oriented to person, place, and time.  Psychiatric:        Mood and Affect: Mood normal.        Behavior: Behavior normal.      No results found for any visits on 09/21/23.  Recent Results (from the past 2160 hours)  Basic Metabolic Panel (BMET)     Status: Abnormal   Collection Time: 07/18/23  8:02 AM  Result Value Ref Range   Glucose 192 (H) 70 - 99 mg/dL   BUN 23 6 - 24 mg/dL   Creatinine, Ser 8.69 (H) 0.76 - 1.27 mg/dL   eGFR 64 >40 fO/fpw/8.26   BUN/Creatinine Ratio 18 9 - 20   Sodium 130 (L) 134 - 144 mmol/L   Potassium 4.7 3.5 - 5.2 mmol/L   Chloride 92 (L) 96 - 106 mmol/L   CO2 20 20 - 29 mmol/L   Calcium  9.7 8.7 - 10.2 mg/dL  Lipid Profile     Status: Abnormal   Collection Time: 07/18/23  8:02 AM  Result Value Ref Range   Cholesterol, Total 240 (H) 100 - 199 mg/dL   Triglycerides 887 0 - 149 mg/dL   HDL 95 >60 mg/dL   VLDL Cholesterol Cal 19 5 - 40 mg/dL   LDL Chol Calc (NIH) 873 (H) 0 - 99 mg/dL   Chol/HDL Ratio 2.5 0.0 - 5.0 ratio    Comment:                                   T. Chol/HDL Ratio                                             Men  Women                               1/2 Avg.Risk  3.4    3.3                                   Avg.Risk  5.0    4.4                                2X Avg.Risk  9.6    7.1                                3X Avg.Risk 23.4   11.0   POCT UA - Microalbumin     Status: Abnormal    Collection Time: 07/24/23  8:20 AM  Result Value Ref Range   Microalbumin Ur, POC 150 mg/L mg/L   Creatinine, POC 100 mg/dL mg/dL   Albumin/Creatinine Ratio, Urine, POC > 300 mg/G  HgB A1c     Status: Abnormal   Collection Time: 07/24/23  8:21 AM  Result Value Ref Range   Hemoglobin A1C 8.1 (A) 4.0 - 5.6 %   HbA1c POC (<> result, manual entry)     HbA1c, POC (prediabetic range)     HbA1c, POC (controlled diabetic range)    CBC with Differential/Platelet     Status: Abnormal   Collection Time: 09/06/23 11:19 AM  Result Value Ref Range   WBC 9.8 3.4 - 10.8 x10E3/uL   RBC 4.04 (L) 4.14 - 5.80 x10E6/uL   Hemoglobin 13.1 13.0 - 17.7 g/dL   Hematocrit 60.1 62.4 - 51.0 %   MCV 99 (H) 79 - 97 fL   MCH 32.4 26.6 - 33.0 pg   MCHC 32.9 31.5 - 35.7 g/dL   RDW 87.3 88.3 - 84.5 %   Platelets 354 150 - 450 x10E3/uL   Neutrophils 83 Not Estab. %   Lymphs 6 Not Estab. %   Monocytes 8 Not Estab. %   Eos 2 Not Estab. %   Basos 1 Not Estab. %   Neutrophils Absolute 8.1 (H) 1.4 - 7.0 x10E3/uL   Lymphocytes Absolute 0.6 (L) 0.7 - 3.1 x10E3/uL   Monocytes Absolute 0.8 0.1 - 0.9 x10E3/uL   EOS (ABSOLUTE) 0.2 0.0 - 0.4 x10E3/uL   Basophils Absolute 0.1 0.0 - 0.2 x10E3/uL   Immature Granulocytes 0 Not Estab. %   Immature Grans (Abs) 0.0 0.0 - 0.1 x10E3/uL  Basic metabolic panel     Status: Abnormal   Collection Time: 09/06/23 11:19 AM  Result Value Ref Range   Glucose 253 (H) 70 - 99 mg/dL   BUN 20 6 - 24 mg/dL   Creatinine, Ser 8.62 (H) 0.76 - 1.27 mg/dL   eGFR 60 >40 fO/fpw/8.26   BUN/Creatinine Ratio 15 9 - 20   Sodium 128 (L) 134 - 144 mmol/L   Potassium 4.2 3.5 - 5.2 mmol/L   Chloride 98 96 - 106 mmol/L   CO2 17 (L) 20 - 29 mmol/L   Calcium  8.2 (L) 8.7 - 10.2 mg/dL  Gastrointestinal Panel by PCR , Stool     Status: None   Collection Time: 09/07/23  9:01 AM   Specimen: Stool  Result Value Ref Range   Campylobacter species NOT DETECTED NOT DETECTED   Plesimonas shigelloides NOT  DETECTED NOT DETECTED   Salmonella species NOT DETECTED NOT DETECTED   Yersinia enterocolitica NOT DETECTED NOT DETECTED   Vibrio species NOT DETECTED NOT DETECTED   Vibrio cholerae NOT DETECTED NOT DETECTED   Enteroaggregative E coli (EAEC) NOT DETECTED NOT DETECTED   Enteropathogenic E coli (EPEC) NOT DETECTED NOT DETECTED   Enterotoxigenic E coli (ETEC) NOT DETECTED NOT DETECTED   Shiga like toxin producing E coli (STEC) NOT DETECTED NOT DETECTED   Shigella/Enteroinvasive E coli (EIEC) NOT DETECTED NOT DETECTED   Cryptosporidium NOT DETECTED NOT DETECTED   Cyclospora cayetanensis NOT DETECTED NOT DETECTED   Entamoeba histolytica NOT DETECTED NOT DETECTED   Giardia lamblia NOT DETECTED NOT DETECTED   Adenovirus F40/41 NOT DETECTED NOT DETECTED   Astrovirus NOT DETECTED NOT DETECTED   Norovirus GI/GII NOT DETECTED NOT DETECTED   Rotavirus A NOT DETECTED NOT DETECTED   Sapovirus (I, II, IV, and V) NOT DETECTED NOT DETECTED    Comment: Performed at [facility], [address] [place], [place] [address]  C Difficile Quick Screen w PCR reflex     Status: None   Collection Time:  09/07/23  9:01 AM   Specimen: STOOL  Result Value Ref Range   C Diff antigen NEGATIVE NEGATIVE   C Diff toxin NEGATIVE NEGATIVE   C Diff interpretation No C. difficile detected.     Comment: Performed at [facility], [address], [place], [place] [address]      Assessment & Plan:  1) Dehydration - recheck kidney function, magnesium  for cramping and CBC for viral, or elevated white cell count 2) Patient will continue to monitor bowel movements and if worsens go to ER and continue fluid replacement 3) Follow up appt in 1 week   Problem List Items Addressed This Visit   None   No follow-ups on file.   Total time spent: 25 minutes  Neale Carpen, NP  09/21/2023   This document may have been prepared by [facility] Voice Recognition software and as such may include unintentional  dictation errors.

## 2023-09-21 NOTE — Patient Instructions (Signed)
 1) Dehydration - recheck kidney function, magnesium  for cramping and CBC for viral, or elevated white cell count 2) Patient will continue to monitor bowel movements and if worsens go to ER and continue fluid replacement 3) Follow up appt in 1 week

## 2023-09-22 LAB — CMP14+EGFR
ALT: 29 IU/L (ref 0–44)
AST: 35 IU/L (ref 0–40)
Albumin: 3.3 g/dL — ABNORMAL LOW (ref 3.8–4.9)
Alkaline Phosphatase: 112 IU/L (ref 44–121)
BUN/Creatinine Ratio: 11 (ref 9–20)
BUN: 13 mg/dL (ref 6–24)
Bilirubin Total: <0.2 mg/dL (ref 0.0–1.2)
CO2: 20 mmol/L (ref 20–29)
Calcium: 8.8 mg/dL (ref 8.7–10.2)
Chloride: 109 mmol/L — ABNORMAL HIGH (ref 96–106)
Creatinine, Ser: 1.16 mg/dL (ref 0.76–1.27)
Globulin, Total: 2.9 g/dL (ref 1.5–4.5)
Glucose: 147 mg/dL — ABNORMAL HIGH (ref 70–99)
Potassium: 4.2 mmol/L (ref 3.5–5.2)
Sodium: 142 mmol/L (ref 134–144)
Total Protein: 6.2 g/dL (ref 6.0–8.5)
eGFR: 73 mL/min/1.73 (ref >59)

## 2023-09-22 LAB — CBC WITH DIFFERENTIAL/PLATELET
Basophils Absolute: 0.1 x10E3/uL (ref 0.0–0.2)
Basos: 1 %
EOS (ABSOLUTE): 0.4 x10E3/uL (ref 0.0–0.4)
Eos: 5 %
Hematocrit: 38 % (ref 37.5–51.0)
Hemoglobin: 12.3 g/dL — ABNORMAL LOW (ref 13.0–17.7)
Immature Grans (Abs): 0 x10E3/uL (ref 0.0–0.1)
Immature Granulocytes: 0 %
Lymphocytes Absolute: 1.5 x10E3/uL (ref 0.7–3.1)
Lymphs: 18 %
MCH: 31.6 pg (ref 26.6–33.0)
MCHC: 32.4 g/dL (ref 31.5–35.7)
MCV: 98 fL — ABNORMAL HIGH (ref 79–97)
Monocytes Absolute: 0.5 x10E3/uL (ref 0.1–0.9)
Monocytes: 6 %
Neutrophils Absolute: 5.7 x10E3/uL (ref 1.4–7.0)
Neutrophils: 70 %
Platelets: 416 x10E3/uL (ref 150–450)
RBC: 3.89 x10E6/uL — ABNORMAL LOW (ref 4.14–5.80)
RDW: 12.9 % (ref 11.6–15.4)
WBC: 8.2 x10E3/uL (ref 3.4–10.8)

## 2023-09-22 LAB — MAGNESIUM: Magnesium: 1.6 mg/dL (ref 1.6–2.3)

## 2023-09-24 ENCOUNTER — Other Ambulatory Visit: Payer: Self-pay

## 2023-09-24 ENCOUNTER — Ambulatory Visit: Payer: Self-pay

## 2023-09-24 ENCOUNTER — Encounter: Payer: Self-pay | Admitting: Pharmacist

## 2023-09-25 LAB — LIPID PANEL
Chol/HDL Ratio: 3.2 ratio (ref 0.0–5.0)
Cholesterol, Total: 167 mg/dL (ref 100–199)
HDL: 52 mg/dL (ref >39)
LDL Chol Calc (NIH): 91 mg/dL (ref 0–99)
Triglycerides: 135 mg/dL (ref 0–149)
VLDL Cholesterol Cal: 24 mg/dL (ref 5–40)

## 2023-09-26 ENCOUNTER — Emergency Department (HOSPITAL_BASED_OUTPATIENT_CLINIC_OR_DEPARTMENT_OTHER)

## 2023-09-26 ENCOUNTER — Inpatient Hospital Stay (HOSPITAL_BASED_OUTPATIENT_CLINIC_OR_DEPARTMENT_OTHER)
Admission: EM | Admit: 2023-09-26 | Discharge: 2023-09-29 | DRG: 872 | Disposition: A | Discharge status: Home / Self Care | Attending: Internal Medicine | Admitting: Internal Medicine

## 2023-09-26 ENCOUNTER — Other Ambulatory Visit: Payer: Self-pay

## 2023-09-26 DIAGNOSIS — Z9641 Presence of insulin pump (external) (internal): Secondary | ICD-10-CM | POA: Diagnosis present

## 2023-09-26 DIAGNOSIS — E109 Type 1 diabetes mellitus without complications: Secondary | ICD-10-CM | POA: Diagnosis present

## 2023-09-26 DIAGNOSIS — Z1152 Encounter for screening for COVID-19: Secondary | ICD-10-CM

## 2023-09-26 DIAGNOSIS — Z8249 Family history of ischemic heart disease and other diseases of the circulatory system: Secondary | ICD-10-CM

## 2023-09-26 DIAGNOSIS — Z794 Long term (current) use of insulin: Secondary | ICD-10-CM

## 2023-09-26 DIAGNOSIS — K529 Noninfective gastroenteritis and colitis, unspecified: Principal | ICD-10-CM

## 2023-09-26 DIAGNOSIS — A0472 Enterocolitis due to Clostridium difficile, not specified as recurrent: Secondary | ICD-10-CM | POA: Diagnosis present

## 2023-09-26 DIAGNOSIS — A419 Sepsis, unspecified organism: Principal | ICD-10-CM | POA: Diagnosis present

## 2023-09-26 DIAGNOSIS — Z833 Family history of diabetes mellitus: Secondary | ICD-10-CM

## 2023-09-26 DIAGNOSIS — E871 Hypo-osmolality and hyponatremia: Secondary | ICD-10-CM | POA: Diagnosis present

## 2023-09-26 DIAGNOSIS — F17210 Nicotine dependence, cigarettes, uncomplicated: Secondary | ICD-10-CM | POA: Diagnosis present

## 2023-09-26 DIAGNOSIS — F17200 Nicotine dependence, unspecified, uncomplicated: Principal | ICD-10-CM

## 2023-09-26 DIAGNOSIS — D72829 Elevated white blood cell count, unspecified: Secondary | ICD-10-CM | POA: Diagnosis present

## 2023-09-26 DIAGNOSIS — E860 Dehydration: Secondary | ICD-10-CM | POA: Diagnosis present

## 2023-09-26 DIAGNOSIS — E8721 Acute metabolic acidosis: Secondary | ICD-10-CM | POA: Diagnosis present

## 2023-09-26 DIAGNOSIS — J9811 Atelectasis: Secondary | ICD-10-CM | POA: Diagnosis present

## 2023-09-26 DIAGNOSIS — E875 Hyperkalemia: Secondary | ICD-10-CM | POA: Diagnosis present

## 2023-09-26 DIAGNOSIS — E785 Hyperlipidemia, unspecified: Secondary | ICD-10-CM | POA: Diagnosis present

## 2023-09-26 DIAGNOSIS — K5730 Diverticulosis of large intestine without perforation or abscess without bleeding: Secondary | ICD-10-CM | POA: Diagnosis present

## 2023-09-26 DIAGNOSIS — Z888 Allergy status to other drugs, medicaments and biological substances status: Secondary | ICD-10-CM

## 2023-09-26 DIAGNOSIS — Z79899 Other long term (current) drug therapy: Secondary | ICD-10-CM

## 2023-09-26 DIAGNOSIS — I700 Atherosclerosis of aorta: Secondary | ICD-10-CM | POA: Diagnosis present

## 2023-09-26 DIAGNOSIS — Z825 Family history of asthma and other chronic lower respiratory diseases: Secondary | ICD-10-CM

## 2023-09-26 DIAGNOSIS — E876 Hypokalemia: Secondary | ICD-10-CM | POA: Diagnosis present

## 2023-09-26 DIAGNOSIS — I10 Essential (primary) hypertension: Secondary | ICD-10-CM | POA: Diagnosis present

## 2023-09-26 DIAGNOSIS — N179 Acute kidney failure, unspecified: Secondary | ICD-10-CM | POA: Diagnosis present

## 2023-09-26 DIAGNOSIS — Z807 Family history of other malignant neoplasms of lymphoid, hematopoietic and related tissues: Secondary | ICD-10-CM

## 2023-09-26 LAB — COMPREHENSIVE METABOLIC PANEL WITH GFR
ALT: 32 U/L (ref 0–44)
AST: 32 U/L (ref 15–41)
Albumin: 3.7 g/dL (ref 3.5–5.0)
Alkaline Phosphatase: 143 U/L — ABNORMAL HIGH (ref 38–126)
Anion gap: 18 — ABNORMAL HIGH (ref 5–15)
BUN: 20 mg/dL (ref 6–20)
CO2: 19 mmol/L — ABNORMAL LOW (ref 22–32)
Calcium: 8.8 mg/dL — ABNORMAL LOW (ref 8.9–10.3)
Chloride: 86 mmol/L — ABNORMAL LOW (ref 98–111)
Creatinine, Ser: 1.36 mg/dL — ABNORMAL HIGH (ref 0.61–1.24)
GFR, Estimated: >60 mL/min (ref >60)
Glucose, Bld: 128 mg/dL — ABNORMAL HIGH (ref 70–99)
Potassium: 4.3 mmol/L (ref 3.5–5.1)
Sodium: 123 mmol/L — ABNORMAL LOW (ref 135–145)
Total Bilirubin: 0.3 mg/dL (ref 0.0–1.2)
Total Protein: 6.8 g/dL (ref 6.5–8.1)

## 2023-09-26 LAB — URINALYSIS, W/ REFLEX TO CULTURE (INFECTION SUSPECTED)
Bacteria, UA: NONE SEEN
Bilirubin Urine: NEGATIVE
Glucose, UA: NEGATIVE mg/dL
Leukocytes,Ua: NEGATIVE
Nitrite: NEGATIVE
Protein, ur: 100 mg/dL — AB
Specific Gravity, Urine: 1.011 (ref 1.005–1.030)
pH: 5.5 (ref 5.0–8.0)

## 2023-09-26 LAB — RESP PANEL BY RT-PCR (RSV, FLU A&B, COVID)  RVPGX2
Influenza A by PCR: NEGATIVE
Influenza B by PCR: NEGATIVE
Resp Syncytial Virus by PCR: NEGATIVE
SARS Coronavirus 2 by RT PCR: NEGATIVE

## 2023-09-26 LAB — CBC WITH DIFFERENTIAL/PLATELET
Abs Immature Granulocytes: 0.16 K/uL — ABNORMAL HIGH (ref 0.00–0.07)
Basophils Absolute: 0.1 K/uL (ref 0.0–0.1)
Basophils Relative: 0 %
Eosinophils Absolute: 0 K/uL (ref 0.0–0.5)
Eosinophils Relative: 0 %
HCT: 33.8 % — ABNORMAL LOW (ref 39.0–52.0)
Hemoglobin: 12.2 g/dL — ABNORMAL LOW (ref 13.0–17.0)
Immature Granulocytes: 1 %
Lymphocytes Relative: 3 %
Lymphs Abs: 0.8 K/uL (ref 0.7–4.0)
MCH: 32.3 pg (ref 26.0–34.0)
MCHC: 36.1 g/dL — ABNORMAL HIGH (ref 30.0–36.0)
MCV: 89.4 fL (ref 80.0–100.0)
Monocytes Absolute: 1.4 K/uL — ABNORMAL HIGH (ref 0.1–1.0)
Monocytes Relative: 5 %
Neutro Abs: 24.7 K/uL — ABNORMAL HIGH (ref 1.7–7.7)
Neutrophils Relative %: 91 %
Platelets: 404 K/uL — ABNORMAL HIGH (ref 150–400)
RBC: 3.78 MIL/uL — ABNORMAL LOW (ref 4.22–5.81)
RDW: 12.6 % (ref 11.5–15.5)
WBC: 27.1 K/uL — ABNORMAL HIGH (ref 4.0–10.5)
nRBC: 0 % (ref 0.0–0.2)

## 2023-09-26 LAB — PROTIME-INR
INR: 0.9 (ref 0.8–1.2)
Prothrombin Time: 12.9 s (ref 11.4–15.2)

## 2023-09-26 LAB — LACTIC ACID, PLASMA
Lactic Acid, Venous: 1.8 mmol/L (ref 0.5–1.9)
Lactic Acid, Venous: 0.7 mmol/L (ref 0.5–1.9)

## 2023-09-26 MED ORDER — VANCOMYCIN HCL IN DEXTROSE 1-5 GM/200ML-% IV SOLN
1000.0000 mg | Freq: Once | INTRAVENOUS | Status: AC
  Administered 2023-09-26: 1000 mg via INTRAVENOUS
  Filled 2023-09-26: qty 200

## 2023-09-26 MED ORDER — INSULIN ASPART 100 UNIT/ML IJ SOLN: 0.0000 [IU] | Freq: Every day | INTRAMUSCULAR | Status: DC

## 2023-09-26 MED ORDER — BISMUTH SUBSALICYLATE 262 MG/15ML PO SUSP
30.0000 mL | ORAL | Status: DC | PRN
  Administered 2023-09-27 – 2023-09-29 (×5): 30 mL via ORAL
  Filled 2023-09-26 (×2): qty 118
  Filled 2023-09-26: qty 236

## 2023-09-26 MED ORDER — IOHEXOL 300 MG/ML  SOLN
100.0000 mL | Freq: Once | INTRAMUSCULAR | Status: AC | PRN
  Administered 2023-09-26: 80 mL via INTRAVENOUS

## 2023-09-26 MED ORDER — ACETAMINOPHEN 325 MG PO TABS
650.0000 mg | ORAL_TABLET | Freq: Once | ORAL | Status: AC
  Administered 2023-09-26: 650 mg via ORAL
  Filled 2023-09-26: qty 2

## 2023-09-26 MED ORDER — LACTATED RINGERS IV BOLUS (SEPSIS)
1000.0000 mL | Freq: Once | INTRAVENOUS | Status: AC
  Administered 2023-09-26: 1000 mL via INTRAVENOUS

## 2023-09-26 MED ORDER — METRONIDAZOLE 500 MG/100ML IV SOLN
500.0000 mg | Freq: Once | INTRAVENOUS | Status: AC
  Administered 2023-09-26: 500 mg via INTRAVENOUS
  Filled 2023-09-26: qty 100

## 2023-09-26 MED ORDER — LACTATED RINGERS IV SOLN
INTRAVENOUS | Status: AC
Start: 2023-09-26 — End: 2023-09-27

## 2023-09-26 MED ORDER — SODIUM CHLORIDE 0.9 % IV SOLN
2.0000 g | Freq: Once | INTRAVENOUS | Status: AC
  Administered 2023-09-26: 2 g via INTRAVENOUS
  Filled 2023-09-26: qty 12.5

## 2023-09-26 MED ORDER — INSULIN ASPART 100 UNIT/ML IJ SOLN
0.0000 [IU] | Freq: Three times a day (TID) | INTRAMUSCULAR | Status: DC
  Administered 2023-09-27: 2 [IU] via SUBCUTANEOUS

## 2023-09-26 MED ORDER — LACTATED RINGERS IV BOLUS (SEPSIS)
500.0000 mL | Freq: Once | INTRAVENOUS | Status: AC
  Administered 2023-09-26: 500 mL via INTRAVENOUS

## 2023-09-26 NOTE — Sepsis Progress Note (Signed)
 Elink monitoring for the code sepsis protocol.

## 2023-09-26 NOTE — ED Provider Notes (Signed)
 [place] EMERGENCY DEPARTMENT AT [facility] Provider Note   CSN: [id number] Arrival date & time: 09/26/23  1839     Patient presents with: Weakness   [name] is a 58 y.o. male.    Weakness    Patient has a history of hypertension diabetes hyperlipidemia acid reflux hyponatremia.  Patient states he has been having some issues with diarrhea last month.  He was having several episodes per day.  Patient was seen in urgent care.  The was instructed to take antidiarrheal agents.  Patient had a cruise planned and left the following day.  Patient states while he was on the cruise the symptoms started to get worse.  He was having more frequent diarrhea.  Patient states he had a passing out spell and he ended up getting admitted to a hospital in [place].  Patient states he had blood tests and x-rays.  He was not given a specific diagnosis as to the cause of his symptoms.  Patient returned about a week ago and has been doing fine since then.  Patient states today however he started having trouble with diarrhea again.  Had a couple episodes.  He spiked a fever.  He denies any coughing.  Has had some nausea but no vomiting.  No dysuria.  Prior to Admission medications   Medication Sig Start Date End Date Taking? Authorizing Provider  amLODipine  (NORVASC ) 5 MG tablet TAKE 1 TABLET(5 MG) BY MOUTH DAILY Patient not taking: Reported on 09/21/2023 12/07/22   Melvenia Manus BRAVO, MD  chlorthalidone  (HYGROTON ) 25 MG tablet Take 1 tablet (25 mg total) by mouth daily. Patient not taking: Reported on 09/21/2023 06/06/23   Melvenia Manus BRAVO, MD  Continuous Glucose Sensor (DEXCOM G7 SENSOR) MISC Use as directed and change every 10 days. 04/16/23   Therisa Benton PARAS, NP  esomeprazole  (NEXIUM ) 40 MG capsule Take 1 capsule (40 mg total) by mouth 2 (two) times daily before a meal. 09/11/23   Bevely Doffing, FNP  Evolocumab  (REPATHA ) 140 MG/ML SOSY Inject 140 mg into the skin every 14 (fourteen) days. 07/17/23    Bevely Doffing, FNP  Evolocumab  (REPATHA ) 140 MG/ML SOSY Inject 140 mg into the skin every 14 (fourteen) days. 08/27/23   Bevely Doffing, FNP  Insulin  Disposable Pump (OMNIPOD 5 DEXG7G6 PODS GEN 5) MISC CHANGE POD EVERY 48 TO 72 HOURS 03/21/23   Therisa Benton PARAS, NP  Insulin  Disposable Pump (OMNIPOD 5 DEXG7G6 PODS GEN 5) MISC Change pod every 48 to 72 hours 05/30/23   Therisa Benton PARAS, NP  Insulin  Disposable Pump (OMNIPOD 5 G6 INTRO, GEN 5,) KIT Change pod every 48-72 hrs 06/07/21   Therisa Benton PARAS, NP  insulin  lispro (HUMALOG ) 100 UNIT/ML injection Use with Omnipod for TDD around 50 units per day 11/21/22   Therisa Benton PARAS, NP  insulin  lispro (HUMALOG ) 100 UNIT/ML injection Use with Omnipod for total daily dose of 50 units per day. 04/16/23   Therisa Benton PARAS, NP  olmesartan  (BENICAR ) 40 MG tablet Take 1 tablet (40 mg total) by mouth daily. Patient not taking: Reported on 09/21/2023 08/27/23   Bevely Doffing, FNP  ondansetron  (ZOFRAN ) 4 MG tablet Take 1 tablet (4 mg total) by mouth every 8 (eight) hours as needed for nausea or vomiting. 09/21/23   Glennon Sand, NP  triamcinolone  cream (KENALOG ) 0.1 % Apply topically 2 (two) times daily. 07/17/23   Bevely Doffing, FNP    Allergies: Crestor  [rosuvastatin  calcium ], Iron, Lipitor [atorvastatin  calcium ], and Statins  Review of Systems  Neurological:  Positive for weakness.    Updated Vital Signs BP 125/62   Pulse 85   Temp 99.8 F (37.7 C) (Oral)   Resp (!) 22   SpO2 92%   Physical Exam  (all labs ordered are listed, but only abnormal results are displayed) Labs Reviewed  COMPREHENSIVE METABOLIC PANEL WITH GFR - Abnormal; Notable for the following components:      Result Value   Sodium 123 (*)    Chloride 86 (*)    CO2 19 (*)    Glucose, Bld 128 (*)    Creatinine, Ser 1.36 (*)    Calcium  8.8 (*)    Alkaline Phosphatase 143 (*)    Anion gap 18 (*)    All other components within normal limits  CBC WITH DIFFERENTIAL/PLATELET -  Abnormal; Notable for the following components:   WBC 27.1 (*)    RBC 3.78 (*)    Hemoglobin 12.2 (*)    HCT 33.8 (*)    MCHC 36.1 (*)    Platelets 404 (*)    Neutro Abs 24.7 (*)    Monocytes Absolute 1.4 (*)    Abs Immature Granulocytes 0.16 (*)    All other components within normal limits  URINALYSIS, W/ REFLEX TO CULTURE (INFECTION SUSPECTED) - Abnormal; Notable for the following components:   Hgb urine dipstick TRACE (*)    Ketones, ur TRACE (*)    Protein, ur 100 (*)    All other components within normal limits  CULTURE, BLOOD (ROUTINE X 2)  RESP PANEL BY RT-PCR (RSV, FLU A&B, COVID)  [id number]  CULTURE, BLOOD (ROUTINE X 2)  GASTROINTESTINAL PANEL BY PCR, STOOL (REPLACES STOOL CULTURE)  C DIFFICILE QUICK SCREEN W PCR REFLEX    LACTIC ACID, PLASMA  LACTIC ACID, PLASMA  PROTIME-INR    EKG: EKG Interpretation Date/Time:  Wednesday September 26 2023 18:48:23 EDT Ventricular Rate:  125 PR Interval:  140 QRS Duration:  76 QT Interval:  314 QTC Calculation: 453 R Axis:   90  Text Interpretation: Sinus tachycardia Rightward axis Anterior infarct , age undetermined Abnormal ECG When compared with ECG of 02-Jun-2021 06:04, Since last tracing rate faster Confirmed by Randol Simmonds [phone]) on 09/26/2023 6:51:30 PM  Radiology: CT ABDOMEN PELVIS W CONTRAST Result Date: 09/26/2023 CLINICAL DATA:  Sepsis EXAM: CT ABDOMEN AND PELVIS WITH CONTRAST TECHNIQUE: Multidetector CT imaging of the abdomen and pelvis was performed using the standard protocol following bolus administration of intravenous contrast. RADIATION DOSE REDUCTION: This exam was performed according to the departmental dose-optimization program which includes automated exposure control, adjustment of the mA and/or kV according to patient size and/or use of iterative reconstruction technique. CONTRAST:  80mL OMNIPAQUE  IOHEXOL  300 MG/ML  SOLN COMPARISON:  05/22/2011 FINDINGS: Lower chest: Calcifications in the right coronary artery.  Dependent atelectasis in the lower lobes. No effusions. Hepatobiliary: No focal hepatic abnormality. Gallbladder unremarkable. Pancreas: No focal abnormality or ductal dilatation. Spleen: No focal abnormality.  Normal size. Adrenals/Urinary Tract: Mild bilateral perinephric stranding is new since prior study, nonspecific. No adrenal or renal mass. No stones or hydronephrosis. Urinary bladder unremarkable. Stomach/Bowel: Left colonic diverticulosis. There appears to be mild wall thickening involving the descending colon and sigmoid colon suggesting colitis. No bowel obstruction. Stomach and small bowel decompressed, unremarkable. Normal appendix. Vascular/Lymphatic: Aortic atherosclerosis. No evidence of aneurysm or adenopathy. Reproductive: No visible focal abnormality. Other: No free fluid or free air. Musculoskeletal: No acute bony abnormality. IMPRESSION: Mild apparent wall thickening in the descending  colon and sigmoid colon concerning for colitis. Scattered left colonic diverticulosis. Dependent atelectasis in the lower lobes. Aortic atherosclerosis. Electronically Signed   By: Franky Crease M.D.   On: 09/26/2023 21:17   DG Chest Port 1 View if patient is in a treatment room. Result Date: 09/26/2023 CLINICAL DATA:  Suspected sepsis. EXAM: PORTABLE CHEST 1 VIEW COMPARISON:  Chest radiograph dated 06/02/2021. FINDINGS: No focal consolidation, pleural effusion, or pneumothorax. The cardiac silhouette is within normal limits. No acute osseous pathology. IMPRESSION: No active disease. Electronically Signed   By: Vanetta Chou M.D.   On: 09/26/2023 19:12     Procedures   Medications Ordered in the ED  lactated ringers  infusion ( Intravenous New Bag/Given 09/26/23 2051)  vancomycin  (VANCOCIN ) IVPB 1000 mg/200 mL premix (1,000 mg Intravenous New Bag/Given 09/26/23 2213)  lactated ringers  bolus 1,000 mL (0 mLs Intravenous Stopped 09/26/23 2125)    And  lactated ringers  bolus 1,000 mL (0 mLs Intravenous  Stopped 09/26/23 2150)    And  lactated ringers  bolus 500 mL (0 mLs Intravenous Stopped 09/26/23 2125)  ceFEPIme  (MAXIPIME ) 2 g in sodium chloride  0.9 % 100 mL IVPB (0 g Intravenous Stopped 09/26/23 2125)  metroNIDAZOLE  (FLAGYL ) IVPB 500 mg (0 mg Intravenous Stopped 09/26/23 2214)  acetaminophen  (TYLENOL ) tablet 650 mg (650 mg Oral Given 09/26/23 2014)  iohexol  (OMNIPAQUE ) 300 MG/ML solution 100 mL (80 mLs Intravenous Contrast Given 09/26/23 2110)    Clinical Course as of 09/26/23 2242  Wed Sep 26, 2023  1930 CBC with Differential(!) Leukocytosis noted [JK]  2035 Comprehensive metabolic panel(!) Sodium and chloride decreased [JK]  2035 Chest x-ray without acute findings [JK]  2142 CT scan shows some mild wall thickening in the descending and sigmoid colon concerning for colitis [JK]  2242 Discussed with Dr. Sundil regarding admission [JK]    Clinical Course User Index [JK] Randol Simmonds, MD                                 Medical Decision Making Differential diagnosis includes but not limited to sepsis, pneumonia, urinary tract infection, diverticulitis colitis  Problems Addressed: Colitis: acute illness or injury that poses a threat to life or bodily functions  Amount and/or Complexity of Data Reviewed Labs: ordered. Decision-making details documented in ED Course. Radiology: ordered.  Risk OTC drugs. Prescription drug management. Decision regarding hospitalization.   Patient was recently hospitalized in [place].  Records are not available from that visit.  Patient states he had been doing well since then  Patient started having trouble with fever diarrhea today.  Labs notable for significant leukocytosis of 27,000.  Urinalysis does not suggest infection.  COVID flu negative pneumonia negative.  Labs do show elevated creatinine as well as an elevated anion gap decreased bicarb hyponatremia hyperkalemia.  Patient was treated with IV fluids and empiric antibiotics.  Initial lactic  acid level reassuring doubt evolving sepsis at this time.  CT scan shows findings suggestive of colitis.  Will start the patient on antibiotics and consult the medical service for admission.     Final diagnoses:  Colitis    ED Discharge Orders     None          Randol Simmonds, MD 09/26/23 2242

## 2023-09-26 NOTE — ED Triage Notes (Signed)
 Diarrhea x 4 weeks. Recent hospitalization in [place]. Given fluids and antibiotics. Unsure of what dx.

## 2023-09-26 NOTE — Plan of Care (Addendum)
 [facility] transfer:  58 year old man history of DM type I, chronic hyponatremia, essential hypertension  and hyperlipidemia presented emergency department complaining of diarrhea for 4 weeks.  Patient went to urgent care and taking antidiarrheal agents 1 month ago.  Patient recently coming from a close while the symptoms started and getting progressively worse.  Patient also has syncopal episodes in the setting of multiple episodes of diarrhea and ended up admitted in the hospital in [place].  Patient stated that he has blood work and chest x-ray and did not have any specific diagnoses that cause any symptoms.  patient reported diarrhea returned after 1 week and ongoing since then.  Also had a spiked fever.  Denies nausea vomiting.  At presentation to ED patient found febrile, tachycardic tachypneic and borderline hypotensive.  CBC showing leukocytosis, stable H&H normal platelet count.  CMP showing low sodium 123 (baseline sodium around 128-130), low chloride 81, low bicarb 19, creatinine 1.36 elevated anion gap 18 and elevated ALP 43.  Blood cultures are in process. Normal lactic acid level.  Respiratory panel negative for COVID/RSV flu.  UA no evidence of UTI. Chest x-ray unremarkable. CT abdomen pelvis showing evidence for descending colon and sigmoid colon colitis. IMPRESSION: Mild apparent wall thickening in the descending colon and sigmoid colon concerning for colitis.  Scattered left colonic diverticulosis.  Dependent atelectasis in the lower lobes.   Aortic atherosclerosis.  In the ED patient received 1 dose of vancomycin , cefepime  and metronidazole .  Also 2.5 L of LR bolus with sepsis protocol.  Hospitalist has been consulted for further evaluation of sepsis in the setting of persistent diarrhea, AKI, acute on chronic hyponatremia and metabolic acidosis.   Griffin Gerrard, MD [facility] 09/26/2023, 10:41 PM

## 2023-09-27 ENCOUNTER — Other Ambulatory Visit: Payer: Self-pay

## 2023-09-27 ENCOUNTER — Ambulatory Visit: Admitting: Nurse Practitioner

## 2023-09-27 DIAGNOSIS — K5730 Diverticulosis of large intestine without perforation or abscess without bleeding: Secondary | ICD-10-CM | POA: Diagnosis present

## 2023-09-27 DIAGNOSIS — E8721 Acute metabolic acidosis: Secondary | ICD-10-CM | POA: Diagnosis present

## 2023-09-27 DIAGNOSIS — E785 Hyperlipidemia, unspecified: Secondary | ICD-10-CM | POA: Diagnosis present

## 2023-09-27 DIAGNOSIS — I10 Essential (primary) hypertension: Secondary | ICD-10-CM | POA: Diagnosis present

## 2023-09-27 DIAGNOSIS — A498 Other bacterial infections of unspecified site: Secondary | ICD-10-CM

## 2023-09-27 DIAGNOSIS — Z888 Allergy status to other drugs, medicaments and biological substances status: Secondary | ICD-10-CM | POA: Diagnosis not present

## 2023-09-27 DIAGNOSIS — F17210 Nicotine dependence, cigarettes, uncomplicated: Secondary | ICD-10-CM | POA: Diagnosis present

## 2023-09-27 DIAGNOSIS — Z807 Family history of other malignant neoplasms of lymphoid, hematopoietic and related tissues: Secondary | ICD-10-CM | POA: Diagnosis not present

## 2023-09-27 DIAGNOSIS — I700 Atherosclerosis of aorta: Secondary | ICD-10-CM | POA: Diagnosis present

## 2023-09-27 DIAGNOSIS — E109 Type 1 diabetes mellitus without complications: Secondary | ICD-10-CM | POA: Diagnosis present

## 2023-09-27 DIAGNOSIS — Z794 Long term (current) use of insulin: Secondary | ICD-10-CM | POA: Diagnosis not present

## 2023-09-27 DIAGNOSIS — N179 Acute kidney failure, unspecified: Secondary | ICD-10-CM

## 2023-09-27 DIAGNOSIS — Z833 Family history of diabetes mellitus: Secondary | ICD-10-CM | POA: Diagnosis not present

## 2023-09-27 DIAGNOSIS — J9811 Atelectasis: Secondary | ICD-10-CM | POA: Diagnosis present

## 2023-09-27 DIAGNOSIS — D72829 Elevated white blood cell count, unspecified: Secondary | ICD-10-CM | POA: Diagnosis present

## 2023-09-27 DIAGNOSIS — K529 Noninfective gastroenteritis and colitis, unspecified: Secondary | ICD-10-CM | POA: Diagnosis present

## 2023-09-27 DIAGNOSIS — E871 Hypo-osmolality and hyponatremia: Secondary | ICD-10-CM | POA: Diagnosis present

## 2023-09-27 DIAGNOSIS — Z825 Family history of asthma and other chronic lower respiratory diseases: Secondary | ICD-10-CM | POA: Diagnosis not present

## 2023-09-27 DIAGNOSIS — A0472 Enterocolitis due to Clostridium difficile, not specified as recurrent: Secondary | ICD-10-CM | POA: Diagnosis present

## 2023-09-27 DIAGNOSIS — E876 Hypokalemia: Secondary | ICD-10-CM | POA: Diagnosis present

## 2023-09-27 DIAGNOSIS — A419 Sepsis, unspecified organism: Secondary | ICD-10-CM | POA: Diagnosis present

## 2023-09-27 DIAGNOSIS — Z9641 Presence of insulin pump (external) (internal): Secondary | ICD-10-CM | POA: Diagnosis present

## 2023-09-27 DIAGNOSIS — Z79899 Other long term (current) drug therapy: Secondary | ICD-10-CM | POA: Diagnosis not present

## 2023-09-27 DIAGNOSIS — Z8249 Family history of ischemic heart disease and other diseases of the circulatory system: Secondary | ICD-10-CM | POA: Diagnosis not present

## 2023-09-27 DIAGNOSIS — Z1152 Encounter for screening for COVID-19: Secondary | ICD-10-CM | POA: Diagnosis not present

## 2023-09-27 DIAGNOSIS — E860 Dehydration: Secondary | ICD-10-CM | POA: Diagnosis present

## 2023-09-27 LAB — GASTROINTESTINAL PANEL BY PCR, STOOL (REPLACES STOOL CULTURE)

## 2023-09-27 LAB — C DIFFICILE QUICK SCREEN W PCR REFLEX
C Diff antigen: POSITIVE — AB
C Diff interpretation: DETECTED
C Diff toxin: POSITIVE — AB

## 2023-09-27 LAB — GLUCOSE, CAPILLARY
Glucose-Capillary: 112 mg/dL — ABNORMAL HIGH (ref 70–99)
Glucose-Capillary: 221 mg/dL — ABNORMAL HIGH (ref 70–99)
Glucose-Capillary: 189 mg/dL — ABNORMAL HIGH (ref 70–99)

## 2023-09-27 LAB — CBG MONITORING, ED
Glucose-Capillary: 178 mg/dL — ABNORMAL HIGH (ref 70–99)
Glucose-Capillary: 218 mg/dL — ABNORMAL HIGH (ref 70–99)

## 2023-09-27 MED ORDER — VANCOMYCIN 50 MG/ML ORAL SOLUTION
125.0000 mg | Freq: Four times a day (QID) | ORAL | Status: DC
  Filled 2023-09-27 (×2): qty 2.5

## 2023-09-27 MED ORDER — SODIUM CHLORIDE 0.9 % IV SOLN
2.0000 g | Freq: Three times a day (TID) | INTRAVENOUS | Status: DC
  Administered 2023-09-27: 2 g via INTRAVENOUS
  Filled 2023-09-27: qty 12.5

## 2023-09-27 MED ORDER — CHLORTHALIDONE 25 MG PO TABS
25.0000 mg | ORAL_TABLET | Freq: Every day | ORAL | Status: DC
Start: 2023-09-27 — End: 2023-09-28
  Administered 2023-09-27: 25 mg via ORAL
  Filled 2023-09-27 (×2): qty 1

## 2023-09-27 MED ORDER — AMLODIPINE BESYLATE 5 MG PO TABS
5.0000 mg | ORAL_TABLET | Freq: Every day | ORAL | Status: DC
  Administered 2023-09-27 – 2023-09-29 (×3): 5 mg via ORAL
  Filled 2023-09-27 (×3): qty 1

## 2023-09-27 MED ORDER — VANCOMYCIN HCL 125 MG PO CAPS
125.0000 mg | ORAL_CAPSULE | Freq: Four times a day (QID) | ORAL | Status: DC
  Administered 2023-09-27 – 2023-09-29 (×8): 125 mg via ORAL
  Filled 2023-09-27 (×9): qty 1

## 2023-09-27 MED ORDER — DEXCOM G7 SENSOR MISC: 3 refills | Status: DC

## 2023-09-27 MED ORDER — SODIUM CHLORIDE 0.9 % IV SOLN
INTRAVENOUS | Status: DC
Start: 2023-09-27 — End: 2023-09-28

## 2023-09-27 MED ORDER — METRONIDAZOLE 500 MG/100ML IV SOLN
500.0000 mg | Freq: Two times a day (BID) | INTRAVENOUS | Status: DC
  Administered 2023-09-27: 500 mg via INTRAVENOUS
  Filled 2023-09-27: qty 100

## 2023-09-27 MED ORDER — INSULIN PUMP
Freq: Three times a day (TID) | SUBCUTANEOUS | Status: DC
  Administered 2023-09-28: 3.9 via SUBCUTANEOUS
  Administered 2023-09-28: 3.5 via SUBCUTANEOUS
  Filled 2023-09-27: qty 1

## 2023-09-27 NOTE — Care Plan (Signed)
 Patient arrived to the unit AAOX 4. Complaining of pain 0/10. No signs/symptoms of distress. Wife bedside. Skin assessment done with Gladis Obie Raring, RN

## 2023-09-27 NOTE — H&P (Signed)
 History and Physical    Patient: [name] FMW:[id number] DOB: 1965/05/03 DOA: 09/26/2023 DOS: the patient was seen and examined on 09/27/2023 PCP: Bevely Doffing, FNP  Patient coming from: Home  Chief Complaint:  Chief Complaint  Patient presents with   Weakness   HPI: [name] is a 58 y.o. male with medical history significant of DM1, HTN, HLD p/w diarrhea for the past 3 weeks and found to have AKI iso CDI.  The patient experienced diarrhea for three weeks, starting before going on a cruise. The patient noted the diarrhea worsened during the cruise, culminating in a syncopal episode on August 28th while touring in [place], leading to hospital admission for dehydration. The patient reported having about 15 liquid and green bowel movements per day. During the hospital stay in [place], blood work and stool samples were performed, with no infections found, and a chest x-ray was done. After returning home, the diarrhea decreased to once a day with the use of Imodium four times daily, and a five day course of Cipro/Flagyl  which the pt finished on 9/3. The diarrhea symptoms escalated again on September 10th, with approximately five loose bowel movements occurring since 6 PM so the pt came back to the ED for evaluation.  In the [facility] ED, pt AFVSS. Labs notable for Na 123, Cr 1.36, and WBC 27.1 (lactic acid 1.8->0.7). C. Diff Ag and toxin positive. Pt initially admitted for AKI iso CDI.   Review of Systems: As mentioned in the history of present illness. All other systems reviewed and are negative. Past Medical History:  Diagnosis Date   Allergy    Anemia    Diabetes mellitus without complication (HCC)    type 1 per patient   GERD (gastroesophageal reflux disease)    HLD (hyperlipidemia)    Hypertension    Past Surgical History:  Procedure Laterality Date   ANKLE SURGERY Right    LUMBAR LAMINECTOMY/DECOMPRESSION MICRODISCECTOMY N/A 10/07/2021   Procedure: Microlumbar decompression L4-5,  possible L5-S1;  Surgeon: Duwayne Purchase, MD;  Location: [facility] OR;  Service: Orthopedics;  Laterality: N/A;   UPPER GASTROINTESTINAL ENDOSCOPY     VASECTOMY  1994   Social History:  reports that he has been smoking cigarettes. He has a 30 pack-year smoking history. He has never used smokeless tobacco. He reports that he does not currently use alcohol. He reports that he does not use drugs.  Allergies  Allergen Reactions   Crestor  [Rosuvastatin  Calcium ] Other (See Comments)    Muscle aches   Iron Other (See Comments)    Tore up my stomach  Liquid Iron   Lipitor [Atorvastatin  Calcium ] Other (See Comments)    Muscles aches   Statins Other (See Comments)    Muscle aches    Family History  Problem Relation Age of Onset   COPD Mother    Diabetes Mother    Early death Father 54       MI   Heart attack Father    Lymphoma Sister 110   Hypertension Brother    Diabetes Maternal Grandmother    Stroke Neg Hx    Alcohol abuse Neg Hx    Drug abuse Neg Hx    Hyperlipidemia Neg Hx    Kidney disease Neg Hx    Colon cancer Neg Hx    Esophageal cancer Neg Hx    Stomach cancer Neg Hx    Rectal cancer Neg Hx     Prior to Admission medications   Medication Sig Start Date  End Date Taking? Authorizing Provider  amLODipine  (NORVASC ) 5 MG tablet TAKE 1 TABLET(5 MG) BY MOUTH DAILY Patient taking differently: Take 5 mg by mouth daily. TAKE 1 TABLET(5 MG) BY MOUTH DAILY 12/07/22  Yes Melvenia Manus BRAVO, MD  chlorthalidone  (HYGROTON ) 25 MG tablet Take 1 tablet (25 mg total) by mouth daily. 06/06/23  Yes Melvenia Manus BRAVO, MD  esomeprazole  (NEXIUM ) 40 MG capsule Take 1 capsule (40 mg total) by mouth 2 (two) times daily before a meal. 09/11/23  Yes Bevely Doffing, FNP  insulin  lispro (HUMALOG ) 100 UNIT/ML injection Use with Omnipod for total daily dose of 50 units per day. 04/16/23  Yes Reardon, Benton PARAS, NP  ondansetron  (ZOFRAN ) 4 MG tablet Take 1 tablet (4 mg total) by mouth every 8 (eight) hours as needed  for nausea or vomiting. 09/21/23  Yes Boswell, Neale, NP  Continuous Glucose Sensor (DEXCOM G7 SENSOR) MISC Use as directed and change every 10 days. 04/16/23   Therisa Benton PARAS, NP  Evolocumab  (REPATHA ) 140 MG/ML SOSY Inject 140 mg into the skin every 14 (fourteen) days. Patient not taking: Reported on 09/27/2023 07/17/23   Bevely Doffing, FNP  Evolocumab  (REPATHA ) 140 MG/ML SOSY Inject 140 mg into the skin every 14 (fourteen) days. Patient not taking: Reported on 09/27/2023 08/27/23   Bevely Doffing, FNP  Insulin  Disposable Pump (OMNIPOD 5 DEXG7G6 PODS GEN 5) MISC CHANGE POD EVERY 48 TO 72 HOURS 03/21/23   Therisa Benton PARAS, NP  Insulin  Disposable Pump (OMNIPOD 5 DEXG7G6 PODS GEN 5) MISC Change pod every 48 to 72 hours 05/30/23   Therisa Benton PARAS, NP  Insulin  Disposable Pump (OMNIPOD 5 G6 INTRO, GEN 5,) KIT Change pod every 48-72 hrs 06/07/21   Therisa Benton PARAS, NP  olmesartan  (BENICAR ) 40 MG tablet Take 1 tablet (40 mg total) by mouth daily. Patient not taking: Reported on 09/21/2023 08/27/23   Bevely Doffing, FNP  triamcinolone  cream (KENALOG ) 0.1 % Apply topically 2 (two) times daily. Patient not taking: Reported on 09/27/2023 07/17/23   Bevely Doffing, FNP    Physical Exam: Vitals:   09/27/23 0815 09/27/23 0845 09/27/23 0915 09/27/23 1105  BP:    129/77  Pulse: 75 76 73 73  Resp: 20 20 18 18   Temp:    99.7 F (37.6 C)  TempSrc:      SpO2: 93% 99% 93% 95%   General: Alert, oriented x3, resting comfortably in no acute distress Respiratory: Lungs clear to auscultation bilaterally with normal respiratory effort; no w/r/r Cardiovascular: Regular rate and rhythm w/o m/r/g Abdomen: Soft, nontender, nondistended. Positive bowel sounds   Data Reviewed:  Lab Results  Component Value Date   WBC 27.1 (H) 09/26/2023   HGB 12.2 (L) 09/26/2023   HCT 33.8 (L) 09/26/2023   MCV 89.4 09/26/2023   PLT 404 (H) 09/26/2023   Lab Results  Component Value Date   GLUCOSE 128 (H) 09/26/2023   CALCIUM   8.8 (L) 09/26/2023   NA 123 (L) 09/26/2023   K 4.3 09/26/2023   CO2 19 (L) 09/26/2023   CL 86 (L) 09/26/2023   BUN 20 09/26/2023   CREATININE 1.36 (H) 09/26/2023   Lab Results  Component Value Date   ALT 32 09/26/2023   AST 32 09/26/2023   GGT 100 (H) 03/24/2022   ALKPHOS 143 (H) 09/26/2023   BILITOT 0.3 09/26/2023   Lab Results  Component Value Date   INR 0.9 09/26/2023   Radiology: CT ABDOMEN PELVIS W CONTRAST Result Date: 09/26/2023 CLINICAL DATA:  Sepsis EXAM: CT ABDOMEN AND PELVIS WITH CONTRAST TECHNIQUE: Multidetector CT imaging of the abdomen and pelvis was performed using the standard protocol following bolus administration of intravenous contrast. RADIATION DOSE REDUCTION: This exam was performed according to the departmental dose-optimization program which includes automated exposure control, adjustment of the mA and/or kV according to patient size and/or use of iterative reconstruction technique. CONTRAST:  80mL OMNIPAQUE  IOHEXOL  300 MG/ML  SOLN COMPARISON:  05/22/2011 FINDINGS: Lower chest: Calcifications in the right coronary artery. Dependent atelectasis in the lower lobes. No effusions. Hepatobiliary: No focal hepatic abnormality. Gallbladder unremarkable. Pancreas: No focal abnormality or ductal dilatation. Spleen: No focal abnormality.  Normal size. Adrenals/Urinary Tract: Mild bilateral perinephric stranding is new since prior study, nonspecific. No adrenal or renal mass. No stones or hydronephrosis. Urinary bladder unremarkable. Stomach/Bowel: Left colonic diverticulosis. There appears to be mild wall thickening involving the descending colon and sigmoid colon suggesting colitis. No bowel obstruction. Stomach and small bowel decompressed, unremarkable. Normal appendix. Vascular/Lymphatic: Aortic atherosclerosis. No evidence of aneurysm or adenopathy. Reproductive: No visible focal abnormality. Other: No free fluid or free air. Musculoskeletal: No acute bony abnormality.  IMPRESSION: Mild apparent wall thickening in the descending colon and sigmoid colon concerning for colitis. Scattered left colonic diverticulosis. Dependent atelectasis in the lower lobes. Aortic atherosclerosis. Electronically Signed   By: Franky Crease M.D.   On: 09/26/2023 21:17   DG Chest Port 1 View if patient is in a treatment room. Result Date: 09/26/2023 CLINICAL DATA:  Suspected sepsis. EXAM: PORTABLE CHEST 1 VIEW COMPARISON:  Chest radiograph dated 06/02/2021. FINDINGS: No focal consolidation, pleural effusion, or pneumothorax. The cardiac silhouette is within normal limits. No acute osseous pathology. IMPRESSION: No active disease. Electronically Signed   By: Vanetta Chou M.D.   On: 09/26/2023 19:12    Assessment and Plan: 64M h/o DM1, HTN, HLD p/w diarrhea for the past 3 weeks and found to have AKI iso CDI.  CDI -D/c IV cefepime  and IV flagyl  -Start PO vancomycin  125mg  QID for 10 days  AKI -MIVF: NS at 100cc/h for 24h -Strict I&Os and daily weights (standing preferred) -F/u BMP daily -Renally dose medications for CrCl -Avoid lovenox, NSAIDs, morphine, Fleet's phosphate enema, regular insulin , contrast; no gadolinium for MRI to avoid nephrogenic systemic fibrosis -Consider renal US  and nephrology consult if worsening AKI  DM1 -PTA insulin  pump per protocol   Advance Care Planning:   Code Status: Prior   Consults: N/A  Family Communication: Wife  Severity of Illness: The appropriate patient status for this patient is INPATIENT. Inpatient status is judged to be reasonable and necessary in order to provide the required intensity of service to ensure the patient's safety. The patient's presenting symptoms, physical exam findings, and initial radiographic and laboratory data in the context of their chronic comorbidities is felt to place them at high risk for further clinical deterioration. Furthermore, it is not anticipated that the patient will be medically stable for  discharge from the hospital within 2 midnights of admission.   * I certify that at the point of admission it is my clinical judgment that the patient will require inpatient hospital care spanning beyond 2 midnights from the point of admission due to high intensity of service, high risk for further deterioration and high frequency of surveillance required.*   ------- I spent 60 minutes reviewing previous notes, at the bedside counseling/discussing the treatment plan, and performing clinical documentation.  Author: Marsha Ada, MD 09/27/2023 1:30 PM  For on call review [email or web address].

## 2023-09-27 NOTE — ED Notes (Signed)
 Dexcom correlates with finger stick blood glucose. Both 218.

## 2023-09-27 NOTE — ED Notes (Signed)
 [facility] called for transport

## 2023-09-27 NOTE — Care Plan (Signed)
 Patient positive for C diff / MD aware / enteric precautions in place / education given to patient and wife Sonny, [place]

## 2023-09-27 NOTE — Inpatient Diabetes Management (Signed)
 Inpatient Diabetes Program Recommendations  AACE/ADA: New Consensus Statement on Inpatient Glycemic Control (2015)  Target Ranges:  Prepandial:   less than 140 mg/dL      Peak postprandial:   less than 180 mg/dL (1-2 hours)      Critically ill patients:  140 - 180 mg/dL   Lab Results  Component Value Date   GLUCAP 112 (H) 09/27/2023   HGBA1C 8.1 (A) 07/24/2023    Review of Glycemic Control  Diabetes history: DM1 Outpatient Diabetes medications: Insulin  Pump Current orders for Inpatient glycemic control: Insulin  Pump  Inpatient Diabetes Program Recommendations:   Patient sees Benton Rio for endocrinology with last office visit 07-24-23. During this appointment, insulin  correction factor changed from 60 to 40. Metformin  was also discontinued.  Will follow while hospitalized.  Thank you, Sawyer Mentzer E. Hays Dunnigan, RN, MSN, CNS, CDCES  Diabetes Coordinator Inpatient Glycemic Control Team Team Pager [phone] (8am-5pm) 09/27/2023 2:07 PM

## 2023-09-28 ENCOUNTER — Other Ambulatory Visit (HOSPITAL_BASED_OUTPATIENT_CLINIC_OR_DEPARTMENT_OTHER): Payer: Self-pay

## 2023-09-28 ENCOUNTER — Telehealth (HOSPITAL_COMMUNITY): Payer: Self-pay | Admitting: Pharmacy Technician

## 2023-09-28 ENCOUNTER — Other Ambulatory Visit (HOSPITAL_COMMUNITY): Payer: Self-pay

## 2023-09-28 DIAGNOSIS — A419 Sepsis, unspecified organism: Secondary | ICD-10-CM | POA: Diagnosis not present

## 2023-09-28 LAB — BASIC METABOLIC PANEL WITH GFR
Anion gap: 13 (ref 5–15)
BUN: 18 mg/dL (ref 6–20)
CO2: 25 mmol/L (ref 22–32)
Calcium: 8.3 mg/dL — ABNORMAL LOW (ref 8.9–10.3)
Chloride: 98 mmol/L (ref 98–111)
Creatinine, Ser: 1.19 mg/dL (ref 0.61–1.24)
GFR, Estimated: >60 mL/min (ref >60)
Glucose, Bld: 143 mg/dL — ABNORMAL HIGH (ref 70–99)
Potassium: 3.4 mmol/L — ABNORMAL LOW (ref 3.5–5.1)
Sodium: 136 mmol/L (ref 135–145)

## 2023-09-28 LAB — CBC WITH DIFFERENTIAL/PLATELET
Abs Immature Granulocytes: 0.05 K/uL (ref 0.00–0.07)
Basophils Absolute: 0.1 K/uL (ref 0.0–0.1)
Basophils Relative: 1 %
Eosinophils Absolute: 0.4 K/uL (ref 0.0–0.5)
Eosinophils Relative: 3 %
HCT: 31.9 % — ABNORMAL LOW (ref 39.0–52.0)
Hemoglobin: 11.2 g/dL — ABNORMAL LOW (ref 13.0–17.0)
Immature Granulocytes: 0 %
Lymphocytes Relative: 9 %
Lymphs Abs: 1.2 K/uL (ref 0.7–4.0)
MCH: 32.3 pg (ref 26.0–34.0)
MCHC: 35.1 g/dL (ref 30.0–36.0)
MCV: 91.9 fL (ref 80.0–100.0)
Monocytes Absolute: 0.8 K/uL (ref 0.1–1.0)
Monocytes Relative: 6 %
Neutro Abs: 10.6 K/uL — ABNORMAL HIGH (ref 1.7–7.7)
Neutrophils Relative %: 81 %
Platelets: 327 K/uL (ref 150–400)
RBC: 3.47 MIL/uL — ABNORMAL LOW (ref 4.22–5.81)
RDW: 12.7 % (ref 11.5–15.5)
WBC: 13.1 K/uL — ABNORMAL HIGH (ref 4.0–10.5)
nRBC: 0 % (ref 0.0–0.2)

## 2023-09-28 LAB — GLUCOSE, CAPILLARY
Glucose-Capillary: 178 mg/dL — ABNORMAL HIGH (ref 70–99)
Glucose-Capillary: 338 mg/dL — ABNORMAL HIGH (ref 70–99)

## 2023-09-28 LAB — PHOSPHORUS: Phosphorus: 3.3 mg/dL (ref 2.5–4.6)

## 2023-09-28 LAB — PROCALCITONIN: Procalcitonin: 3.47 ng/mL

## 2023-09-28 LAB — C-REACTIVE PROTEIN: CRP: 16.3 mg/dL — ABNORMAL HIGH (ref <1.0)

## 2023-09-28 LAB — MAGNESIUM: Magnesium: 1.8 mg/dL (ref 1.7–2.4)

## 2023-09-28 MED ORDER — MAGNESIUM SULFATE 2 GM/50ML IV SOLN
2.0000 g | Freq: Once | INTRAVENOUS | Status: AC
  Administered 2023-09-28: 2 g via INTRAVENOUS
  Filled 2023-09-28: qty 50

## 2023-09-28 MED ORDER — POTASSIUM CHLORIDE CRYS ER 20 MEQ PO TBCR
40.0000 meq | EXTENDED_RELEASE_TABLET | Freq: Once | ORAL | Status: AC
  Administered 2023-09-28: 40 meq via ORAL
  Filled 2023-09-28: qty 2

## 2023-09-28 MED ORDER — SODIUM CHLORIDE 0.9 % IV SOLN: INTRAVENOUS | Status: AC

## 2023-09-28 MED ORDER — HEPARIN SODIUM (PORCINE) 5000 UNIT/ML IJ SOLN
5000.0000 [IU] | Freq: Three times a day (TID) | INTRAMUSCULAR | Status: DC
  Administered 2023-09-28 – 2023-09-29 (×4): 5000 [IU] via SUBCUTANEOUS
  Filled 2023-09-28 (×4): qty 1

## 2023-09-28 MED ORDER — POTASSIUM CHLORIDE CRYS ER 20 MEQ PO TBCR
20.0000 meq | EXTENDED_RELEASE_TABLET | Freq: Once | ORAL | Status: AC
  Administered 2023-09-28: 20 meq via ORAL
  Filled 2023-09-28: qty 1

## 2023-09-28 NOTE — Progress Notes (Signed)
 Orthopedic Tech Progress Note Patient Details:  [name] [birth year] [id number]  Patient ID: [name], male   DOB: 1965/01/31, 58 y.o.   MRN: [id number] [facility] order placed for drop foot orthotic. [name] 09/28/2023, 8:03 PM

## 2023-09-28 NOTE — Plan of Care (Signed)

## 2023-09-28 NOTE — Evaluation (Signed)
 Physical Therapy Brief Evaluation and Discharge Note Patient Details Name: [name] MRN: [id number] DOB: 12-Dec-1965 Today's Date: 09/28/2023   History of Present Illness  58 y.o. male p/w diarrhea for the past 3 weeks and found to have AKI iso CDI. Past medical history significant of DM1, HTN, HLD.  Clinical Impression  Pt presents with admitting diagnosis above. Pt today was able to ambulate in hallway independently with no AD however was noted to have some R foot drop which pt reports is baseline for him since his back surgery a few years ago. PTA pt was fully independent. Pt presents at or near baseline mobility. Pt has no further acute PT needs and will be signing off. Pt would likely benefit from an AFO upon DC. Re consult PT if mobility status changes. Pt would benefit from continued mobility with mobility specialist during acute stay.        PT Assessment Patient does not need any further PT services  Assistance Needed at Discharge  PRN    Equipment Recommendations Other (comment) (Pt would benefit from an AFO)  Recommendations for Other Services       Precautions/Restrictions Precautions Precautions: Fall;Other (comment) Recall of Precautions/Restrictions: Intact Precaution/Restrictions Comments: C diff Restrictions Weight Bearing Restrictions Per Provider Order: No        Mobility  Bed Mobility   Supine/Sidelying to sit: Independent Sit to supine/sidelying: Independent    Transfers Overall transfer level: Independent Equipment used: None                    Ambulation/Gait Ambulation/Gait assistance: Independent Gait Distance (Feet): 350 Feet Assistive device: None Gait Pattern/deviations: Decreased dorsiflexion - right, Step-through pattern Gait Speed: Pace WFL General Gait Details: R foot drop at baseline. Pt noted to have increased hip flexion to clear R foot.  Home Activity Instructions    Stairs Stairs: Yes Stairs assistance:  Independent Stair Management: No rails, Step to pattern, Forwards Number of Stairs: 4 General stair comments: no LOB noted.  Modified Rankin (Stroke Patients Only)        Balance Overall balance assessment: Independent                        Pertinent Vitals/Pain PT - Brief Vital Signs All Vital Signs Stable: Yes Pain Assessment Pain Assessment: No/denies pain     Home Living Family/patient expects to be discharged to:: Private residence Living Arrangements: Spouse/significant other Available Help at Discharge: Family;Available 24 hours/day Home Environment: Stairs to enter;No rail  Stairs-Number of Steps: 4 Home Equipment: Cane - single point        Prior Function Level of Independence: Independent      UE/LE Assessment   UE ROM/Strength/Tone/Coordination: WFL    LE ROM/Strength/Tone/Coordination: WFL (R foot drop from previous back surgery)      Communication   Communication Communication: No apparent difficulties     Cognition Overall Cognitive Status: Appears within functional limits for tasks assessed/performed       General Comments General comments (skin integrity, edema, etc.): VSS    Exercises     Assessment/Plan    PT Problem List         PT Visit Diagnosis Other abnormalities of gait and mobility (R26.89)    No Skilled PT Patient at baseline level of functioning;Patient is independent with all acitivity/mobility   Co-evaluation                AMPAC 6 Clicks Help needed  turning from your back to your side while in a flat bed without using bedrails?: None Help needed moving from lying on your back to sitting on the side of a flat bed without using bedrails?: None Help needed moving to and from a bed to a chair (including a wheelchair)?: None Help needed standing up from a chair using your arms (e.g., wheelchair or bedside chair)?: None Help needed to walk in hospital room?: None Help needed climbing 3-5 steps with a  railing? : None 6 Click Score: 24      End of Session Equipment Utilized During Treatment: Gait belt Activity Tolerance: Patient tolerated treatment well Patient left: in bed;with call bell/phone within reach;with family/visitor present Nurse Communication: Mobility status PT Visit Diagnosis: Other abnormalities of gait and mobility (R26.89)     Time: 8886-8873 PT Time Calculation (min) (ACUTE ONLY): 13 min  Charges:   PT Evaluation $PT Eval Low Complexity: 1 Low      Sueellen NOVAK, PT, DPT Acute Rehab Services [phone]   Laresha Bacorn  09/28/2023, 4:01 PM

## 2023-09-28 NOTE — Telephone Encounter (Signed)
 Patient Product/process development scientist completed.    The patient is insured through [employer]. Patient has [employer], may use a copay card, and/or apply for patient assistance if available.    Ran test claim for vancomycin  125 mg and the current 10 day co-pay is $10.00.   This test claim was processed through [facility]- copay amounts may vary at other pharmacies due to pharmacy/plan contracts, or as the patient moves through the different stages of their insurance plan.     Reyes Sharps, CPHT Pharmacy Technician III Certified Patient Advocate [facility] Patient Advocate Team Direct Number: [phone]  Fax: [phone]

## 2023-09-28 NOTE — Progress Notes (Addendum)
 PROGRESS NOTE                                                                                                                                                                                                             Patient Demographics:    [name], is a 58 y.o. male, DOB - Jan 15, 1966, FMW:[id number]  Outpatient Primary MD for the patient is Bevely Doffing, FNP    LOS - 1  Admit date - 09/26/2023    Chief Complaint  Patient presents with   Weakness       Brief Narrative (HPI from H&P)   58 y.o. male with medical history significant of DM1, HTN, HLD p/w diarrhea for the past 3 weeks and found to have AKI iso CDI.   The patient experienced diarrhea for three weeks, starting before going on a cruise. The patient noted the diarrhea worsened during the cruise, culminating in a syncopal episode on August 28th while touring in [place], leading to hospital admission for dehydration. The patient reported having about 15 liquid and green bowel movements per day. During the hospital stay in [place], blood work and stool samples were performed, with no infections found, and a chest x-ray was done. After returning home, the diarrhea decreased to once a day with the use of Imodium four times daily, and a five day course of Cipro/Flagyl  which the pt finished on 9/3. The diarrhea symptoms escalated again on September 10th, with approximately five loose bowel movements occurring since 6 PM so the pt came back to the ED for evaluation.   Subjective:    [name] today has, No headache, No chest pain, No abdominal pain - No Nausea, No new weakness tingling or numbness, no SOB, improving diarrhea.   Assessment  & Plan :   Sepsis due to acute C diff colitis -  improving with PO vancomycin  125mg  QID & IVF, outpt GI follow up.  Sepsis pathophysiology has resolved.   Severe dehydration, hypokalemia and AKI due to diarrhea.  Improving after IV  fluids, replace electrolytes and monitor   DM1 -PTA insulin  pump per protocol    Lab Results  Component Value Date   HGBA1C 8.1 (A) 07/24/2023   CBG (last 3)  Recent Labs    09/27/23 1604 09/27/23 2115 09/28/23 0735  GLUCAP 221* 189* 178*  Condition - Fair  Family Communication  :  None  Code Status :  Full  Consults  :  None  PUD Prophylaxis :    Procedures  :     CT - Mild apparent wall thickening in the descending colon and sigmoid colon concerning for colitis. Scattered left colonic diverticulosis. Dependent atelectasis in the lower lobes. Aortic atherosclerosis.      Disposition Plan  :    Status is: Inpatient  DVT Prophylaxis  :    heparin  injection 5,000 Units Start: 09/28/23 0600    Lab Results  Component Value Date   PLT 327 09/28/2023    Diet :  Diet Order             Diet heart healthy/carb modified Room service appropriate? Yes; Fluid consistency: Thin  Diet effective now                    Inpatient Medications  Scheduled Meds:  amLODipine   5 mg Oral Daily   heparin  injection (subcutaneous)  5,000 Units Subcutaneous Q8H   insulin  pump   Subcutaneous TID WC, HS, 0200   potassium chloride   20 mEq Oral Once   potassium chloride   40 mEq Oral Once   vancomycin   125 mg Oral QID   Continuous Infusions:  sodium chloride      magnesium  sulfate bolus IVPB     PRN Meds:.bismuth  subsalicylate   Objective:   Vitals:   09/27/23 2021 09/28/23 0026 09/28/23 0400 09/28/23 0736  BP: 135/79 (!) 147/75 132/86 (!) 145/89  Pulse: 72 63 70 71  Resp: 18 19 20    Temp: 98.4 F (36.9 C) 98.5 F (36.9 C) 98.3 F (36.8 C) 98.1 F (36.7 C)  TempSrc: Oral Oral Oral Oral  SpO2: 95% 95% 95%     Wt Readings from Last 3 Encounters:  09/21/23 76.7 kg  07/24/23 82 kg  07/17/23 81.7 kg     Intake/Output Summary (Last 24 hours) at 09/28/2023 0838 Last data filed at 09/28/2023 0400 Gross per 24 hour  Intake 1771.68 ml  Output --   Net 1771.68 ml     Physical Exam  Awake Alert, No new F.N deficits, Normal affect [place].AT,PERRAL Supple Neck, No JVD,   Symmetrical Chest wall movement, Good air movement bilaterally, CTAB RRR,No Gallops,Rubs or new Murmurs,  +ve B.Sounds, Abd Soft, No tenderness,   No Cyanosis, Clubbing or edema        Data Review:    Recent Labs  Lab 09/21/23 0846 09/26/23 1855 09/28/23 0621  WBC 8.2 27.1* 13.1*  HGB 12.3* 12.2* 11.2*  HCT 38.0 33.8* 31.9*  PLT 416 404* 327  MCV 98* 89.4 91.9  MCH 31.6 32.3 32.3  MCHC 32.4 36.1* 35.1  RDW 12.9 12.6 12.7  LYMPHSABS 1.5 0.8 1.2  MONOABS  --  1.4* 0.8  EOSABS 0.4 0.0 0.4  BASOSABS 0.1 0.1 0.1    Recent Labs  Lab 09/21/23 0846 09/26/23 1855 09/26/23 2121 09/28/23 0621  NA 142 123*  --  136  K 4.2 4.3  --  3.4*  CL 109* 86*  --  98  CO2 20 19*  --  25  ANIONGAP  --  18*  --  13  GLUCOSE 147* 128*  --  143*  BUN 13 20  --  18  CREATININE 1.16 1.36*  --  1.19  AST 35 32  --   --   ALT 29 32  --   --  ALKPHOS 112 143*  --   --   BILITOT <0.2 0.3  --   --   ALBUMIN 3.3* 3.7  --   --   CRP  --   --   --  16.3*  PROCALCITON  --   --   --  3.47  LATICACIDVEN  --  1.8 0.7  --   INR  --  0.9  --   --   MG 1.6  --   --  1.8  PHOS  --   --   --  3.3  CALCIUM  8.8 8.8*  --  8.3*      Recent Labs  Lab 09/21/23 0846 09/26/23 1855 09/26/23 2121 09/28/23 0621  CRP  --   --   --  16.3*  PROCALCITON  --   --   --  3.47  LATICACIDVEN  --  1.8 0.7  --   INR  --  0.9  --   --   MG 1.6  --   --  1.8  CALCIUM  8.8 8.8*  --  8.3*    --------------------------------------------------------------------------------------------------------------- Lab Results  Component Value Date   CHOL 167 09/24/2023   HDL 52 09/24/2023   LDLCALC 91 09/24/2023   LDLDIRECT 156.0 01/18/2016   TRIG 135 09/24/2023   CHOLHDL 3.2 09/24/2023    Lab Results  Component Value Date   HGBA1C 8.1 (A) 07/24/2023     Micro Results Recent Results  (from the past 240 hours)  Culture, blood (Routine x 2)     Status: None (Preliminary result)   Collection Time: 09/26/23  6:47 PM   Specimen: BLOOD LEFT ARM  Result Value Ref Range Status   Specimen Description   Final    BLOOD LEFT ARM Performed at [facility], [address] [place], [place] [address]    Special Requests   Final    Blood Culture results may not be optimal due to an inadequate volume of blood received in culture bottles BOTTLES DRAWN AEROBIC AND ANAEROBIC Performed at [facility], [address], [place], [place] [address]    Culture   Final    NO GROWTH < 12 HOURS Performed at [facility], [address] [place], [place] [address]    Report Status PENDING  Incomplete  Culture, blood (Routine x 2)     Status: None (Preliminary result)   Collection Time: 09/26/23  7:59 PM   Specimen: Right Antecubital; Blood  Result Value Ref Range Status   Specimen Description   Final    RIGHT ANTECUBITAL Performed at [facility], [address] [place], [place] [address]    Special Requests   Final    BOTTLES DRAWN AEROBIC AND ANAEROBIC Blood Culture adequate volume Performed at [facility], [address] [place], [place] [address]    Culture   Final    NO GROWTH < 12 HOURS Performed at [facility], [address] [place], [place] [address]    Report Status PENDING  Incomplete  Resp panel by RT-PCR (RSV, Flu A&B, Covid) Anterior Nasal Swab     Status: None   Collection Time: 09/26/23  8:01 PM   Specimen: Anterior Nasal Swab  Result Value Ref Range Status   SARS Coronavirus 2 by RT PCR NEGATIVE NEGATIVE Final    Comment: (NOTE) SARS-CoV-2 target nucleic acids are NOT DETECTED.  The SARS-CoV-2 RNA is generally detectable in upper respiratory specimens during the acute phase of  infection. The lowest concentration of SARS-CoV-2 viral copies this assay can detect is 138 copies/mL. A  negative result does not preclude SARS-Cov-2 infection and should not be used as the sole basis for treatment or other patient management decisions. A negative result may occur with  improper specimen collection/handling, submission of specimen other than nasopharyngeal swab, presence of viral mutation(s) within the areas targeted by this assay, and inadequate number of viral copies(<138 copies/mL). A negative result must be combined with clinical observations, patient history, and epidemiological information. The expected result is Negative.  Fact Sheet for Patients:  BloggerCourse.com  Fact Sheet for Healthcare Providers:  SeriousBroker.it  This test is no t yet approved or cleared by the [place]  FDA and  has been authorized for detection and/or diagnosis of SARS-CoV-2 by FDA under an Emergency Use Authorization (EUA). This EUA will remain  in effect (meaning this test can be used) for the duration of the COVID-19 declaration under Section 564(b)(1) of the Act, 21 U.S.C.section 360bbb-3(b)(1), unless the authorization is terminated  or revoked sooner.       Influenza A by PCR NEGATIVE NEGATIVE Final   Influenza B by PCR NEGATIVE NEGATIVE Final    Comment: (NOTE) The Xpert Xpress SARS-CoV-2/FLU/RSV plus assay is intended as an aid in the diagnosis of influenza from Nasopharyngeal swab specimens and should not be used as a sole basis for treatment. Nasal washings and aspirates are unacceptable for Xpert Xpress SARS-CoV-2/FLU/RSV testing.  Fact Sheet for Patients: BloggerCourse.com  Fact Sheet for Healthcare Providers: SeriousBroker.it  This test is not yet approved or cleared by the [place]  FDA and has been authorized for detection and/or diagnosis of SARS-CoV-2 by FDA under an Emergency Use Authorization (EUA). This EUA will remain in effect (meaning this test can  be used) for the duration of the COVID-19 declaration under Section 564(b)(1) of the Act, 21 U.S.C. section 360bbb-3(b)(1), unless the authorization is terminated or revoked.     Resp Syncytial Virus by PCR NEGATIVE NEGATIVE Final    Comment: (NOTE) Fact Sheet for Patients: BloggerCourse.com  Fact Sheet for Healthcare Providers: SeriousBroker.it  This test is not yet approved or cleared by the [place]  FDA and has been authorized for detection and/or diagnosis of SARS-CoV-2 by FDA under an Emergency Use Authorization (EUA). This EUA will remain in effect (meaning this test can be used) for the duration of the COVID-19 declaration under Section 564(b)(1) of the Act, 21 U.S.C. section 360bbb-3(b)(1), unless the authorization is terminated or revoked.  Performed at [facility], [address] [place], [place] [address]   Gastrointestinal Panel by PCR , Stool     Status: None   Collection Time: 09/26/23 10:41 PM   Specimen: Stool  Result Value Ref Range Status   Campylobacter species NOT DETECTED NOT DETECTED Final   Plesimonas shigelloides NOT DETECTED NOT DETECTED Final   Salmonella species NOT DETECTED NOT DETECTED Final   Yersinia enterocolitica NOT DETECTED NOT DETECTED Final   Vibrio species NOT DETECTED NOT DETECTED Final   Vibrio cholerae NOT DETECTED NOT DETECTED Final   Enteroaggregative E coli (EAEC) NOT DETECTED NOT DETECTED Final   Enteropathogenic E coli (EPEC) NOT DETECTED NOT DETECTED Final   Enterotoxigenic E coli (ETEC) NOT DETECTED NOT DETECTED Final   Shiga like toxin producing E coli (STEC) NOT DETECTED NOT DETECTED Final   Shigella/Enteroinvasive E coli (EIEC) NOT DETECTED NOT DETECTED Final   Cryptosporidium NOT DETECTED NOT DETECTED Final   Cyclospora cayetanensis NOT DETECTED NOT  DETECTED Final   Entamoeba histolytica NOT DETECTED NOT DETECTED Final   Giardia lamblia NOT  DETECTED NOT DETECTED Final   Adenovirus F40/41 NOT DETECTED NOT DETECTED Final   Astrovirus NOT DETECTED NOT DETECTED Final   Norovirus GI/GII NOT DETECTED NOT DETECTED Final   Rotavirus A NOT DETECTED NOT DETECTED Final   Sapovirus (I, II, IV, and V) NOT DETECTED NOT DETECTED Final    Comment: Performed at [facility], [address] [place], [place] [address]  C Difficile Quick Screen w PCR reflex     Status: Abnormal   Collection Time: 09/26/23 10:42 PM   Specimen: Stool  Result Value Ref Range Status   C Diff antigen POSITIVE (A) NEGATIVE Final   C Diff toxin POSITIVE (A) NEGATIVE Final   C Diff interpretation Toxin producing C. difficile detected.  Corrected    Comment: CRITICAL RESULT CALLED TO, READ BACK BY AND VERIFIED WITH: RN LESLIE ROSE ON 09/27/23 @ 1258 BU DRT Performed at [facility], [address] [place], [place] [address] CORRECTED ON 09/11 AT 1258: PREVIOUSLY REPORTED AS Toxin producing C. difficile detected. CRITICAL RESULT CALLED TO, READ BACK BY AND VERIFIED WITH: RN      Radiology Report CT ABDOMEN PELVIS W CONTRAST Result Date: 09/26/2023 CLINICAL DATA:  Sepsis EXAM: CT ABDOMEN AND PELVIS WITH CONTRAST TECHNIQUE: Multidetector CT imaging of the abdomen and pelvis was performed using the standard protocol following bolus administration of intravenous contrast. RADIATION DOSE REDUCTION: This exam was performed according to the departmental dose-optimization program which includes automated exposure control, adjustment of the mA and/or kV according to patient size and/or use of iterative reconstruction technique. CONTRAST:  80mL OMNIPAQUE  IOHEXOL  300 MG/ML  SOLN COMPARISON:  05/22/2011 FINDINGS: Lower chest: Calcifications in the right coronary artery. Dependent atelectasis in the lower lobes. No effusions. Hepatobiliary: No focal hepatic abnormality. Gallbladder unremarkable. Pancreas: No focal abnormality or ductal dilatation. Spleen: No focal  abnormality.  Normal size. Adrenals/Urinary Tract: Mild bilateral perinephric stranding is new since prior study, nonspecific. No adrenal or renal mass. No stones or hydronephrosis. Urinary bladder unremarkable. Stomach/Bowel: Left colonic diverticulosis. There appears to be mild wall thickening involving the descending colon and sigmoid colon suggesting colitis. No bowel obstruction. Stomach and small bowel decompressed, unremarkable. Normal appendix. Vascular/Lymphatic: Aortic atherosclerosis. No evidence of aneurysm or adenopathy. Reproductive: No visible focal abnormality. Other: No free fluid or free air. Musculoskeletal: No acute bony abnormality. IMPRESSION: Mild apparent wall thickening in the descending colon and sigmoid colon concerning for colitis. Scattered left colonic diverticulosis. Dependent atelectasis in the lower lobes. Aortic atherosclerosis. Electronically Signed   By: Franky Crease M.D.   On: 09/26/2023 21:17   DG Chest Port 1 View if patient is in a treatment room. Result Date: 09/26/2023 CLINICAL DATA:  Suspected sepsis. EXAM: PORTABLE CHEST 1 VIEW COMPARISON:  Chest radiograph dated 06/02/2021. FINDINGS: No focal consolidation, pleural effusion, or pneumothorax. The cardiac silhouette is within normal limits. No acute osseous pathology. IMPRESSION: No active disease. Electronically Signed   By: Vanetta Chou M.D.   On: 09/26/2023 19:12     Signature  -   Lavada Stank M.D on 09/28/2023 at 8:38 AM   -  To page go to [email or web address]

## 2023-09-29 ENCOUNTER — Other Ambulatory Visit (HOSPITAL_BASED_OUTPATIENT_CLINIC_OR_DEPARTMENT_OTHER): Payer: Self-pay

## 2023-09-29 ENCOUNTER — Other Ambulatory Visit (HOSPITAL_COMMUNITY): Payer: Self-pay

## 2023-09-29 DIAGNOSIS — A419 Sepsis, unspecified organism: Secondary | ICD-10-CM | POA: Diagnosis not present

## 2023-09-29 LAB — GLUCOSE, CAPILLARY
Glucose-Capillary: 182 mg/dL — ABNORMAL HIGH (ref 70–99)
Glucose-Capillary: 184 mg/dL — ABNORMAL HIGH (ref 70–99)
Glucose-Capillary: 266 mg/dL — ABNORMAL HIGH (ref 70–99)

## 2023-09-29 LAB — CBC WITH DIFFERENTIAL/PLATELET
Abs Immature Granulocytes: 0.04 K/uL (ref 0.00–0.07)
Basophils Absolute: 0.1 K/uL (ref 0.0–0.1)
Basophils Relative: 1 %
Eosinophils Absolute: 0.4 K/uL (ref 0.0–0.5)
Eosinophils Relative: 5 %
HCT: 31.1 % — ABNORMAL LOW (ref 39.0–52.0)
Hemoglobin: 10.6 g/dL — ABNORMAL LOW (ref 13.0–17.0)
Immature Granulocytes: 1 %
Lymphocytes Relative: 22 %
Lymphs Abs: 1.7 K/uL (ref 0.7–4.0)
MCH: 31.8 pg (ref 26.0–34.0)
MCHC: 34.1 g/dL (ref 30.0–36.0)
MCV: 93.4 fL (ref 80.0–100.0)
Monocytes Absolute: 0.6 K/uL (ref 0.1–1.0)
Monocytes Relative: 8 %
Neutro Abs: 4.8 K/uL (ref 1.7–7.7)
Neutrophils Relative %: 63 %
Platelets: 314 K/uL (ref 150–400)
RBC: 3.33 MIL/uL — ABNORMAL LOW (ref 4.22–5.81)
RDW: 12.5 % (ref 11.5–15.5)
WBC: 7.6 K/uL (ref 4.0–10.5)
nRBC: 0 % (ref 0.0–0.2)

## 2023-09-29 LAB — BASIC METABOLIC PANEL WITH GFR
Anion gap: 7 (ref 5–15)
BUN: 16 mg/dL (ref 6–20)
CO2: 26 mmol/L (ref 22–32)
Calcium: 8 mg/dL — ABNORMAL LOW (ref 8.9–10.3)
Chloride: 101 mmol/L (ref 98–111)
Creatinine, Ser: 1.03 mg/dL (ref 0.61–1.24)
GFR, Estimated: >60 mL/min (ref >60)
Glucose, Bld: 217 mg/dL — ABNORMAL HIGH (ref 70–99)
Potassium: 3.7 mmol/L (ref 3.5–5.1)
Sodium: 134 mmol/L — ABNORMAL LOW (ref 135–145)

## 2023-09-29 LAB — MAGNESIUM: Magnesium: 1.9 mg/dL (ref 1.7–2.4)

## 2023-09-29 MED ORDER — INSULIN ASPART 100 UNIT/ML IJ SOLN
0.0000 [IU] | INTRAMUSCULAR | Status: DC
  Administered 2023-09-29: 3.3 [IU] via SUBCUTANEOUS
  Administered 2023-09-29: 0 [IU] via SUBCUTANEOUS

## 2023-09-29 MED ORDER — DEXCOM G7 SENSOR MISC
1.0000 | 3 refills | Status: DC
  Filled 2023-09-29: qty 3, 30d supply, fill #0

## 2023-09-29 MED ORDER — VANCOMYCIN HCL 125 MG PO CAPS
125.0000 mg | ORAL_CAPSULE | Freq: Four times a day (QID) | ORAL | 0 refills | Status: AC
  Filled 2023-09-29: qty 28, 7d supply, fill #0

## 2023-09-29 NOTE — Plan of Care (Addendum)
  Malfunctioning insulin  pump RN reported that patient insulin  pump stopped working around 10 PM. -Discontinue the insulin  pump from the chart. - Starting sliding scale insulin  every 4 hours with POC blood glucose check. -Consulting diabetic educator to replace insulin  pump in the daytime.  Letcher Schweikert, MD [facility] 09/29/2023, 12:41 AM

## 2023-09-29 NOTE — TOC Transition Note (Signed)
 Transition of Care [facility]) - Discharge Note   Patient Details  Name: [name] MRN: [id number] Date of Birth: 03-22-1965  Transition of [facility] CM/SW Contact:  Ermalinda Penton [place], [place] Phone Number: 09/29/2023, 9:34 AM   Clinical Narrative:    Patient to discharge home today with his spouse. Patient's spouse will provide transportation home. Patient states that he is unable to work at this time and had not worked in over 3 months. Patient in process of applying for long term disability. Patient provided with website [email or web address] to apply for medicaid and food stamps. Also provided with contact for the [facility] Business office [phone] to discuss any options to address hospital bills. Patient provided with general resource list for [facility]. Use  RightWingLunacy.co.za and [employer] also encouraged.   Alaylah [place], LCSW Transition of Care     Final next level of care: Home/Self Care Barriers to Discharge: No Barriers Identified   Patient Goals and CMS Choice Patient states their goals for this hospitalization and ongoing recovery are:: to get colitis under contro          Discharge Placement                       Discharge Plan and Services Additional resources added to the After Visit Summary for   In-house Referral: Clinical Social Work Discharge Planning Services: NA                                 Social Drivers of Health (SDOH) Interventions SDOH Screenings   Food Insecurity: Patient Declined (09/27/2023)  Housing: Unknown (09/27/2023)  Transportation Needs: Patient Declined (09/27/2023)  Utilities: Patient Declined (09/27/2023)  Alcohol Screen: Low Risk  (09/11/2022)  Depression (PHQ2-9): Medium Risk (09/21/2023)  Financial Resource Strain: High Risk (09/11/2022)  Social Connections: Unknown (09/11/2022)  Tobacco Use: High Risk (09/21/2023)     Readmission Risk Interventions     No data to display

## 2023-09-29 NOTE — Discharge Summary (Signed)
 [name] FMW:[id number] DOB: Aug 01, 1965 DOA: 09/26/2023  PCP: Bevely Doffing, FNP  Admit date: 09/26/2023  Discharge date: 09/29/2023  Admitted From: Home   Disposition:  Home   Recommendations for Outpatient Follow-up:   Follow up with PCP in 1-2 weeks  PCP Please obtain BMP/CBC, 2 view CXR in 1week,  (see Discharge instructions)   PCP Please follow up on the following pending results:    Home Health: None   Equipment/Devices: None  Consultations: None  Discharge Condition: Stable    CODE STATUS: Full    Diet Recommendation: Heart Healthy     Chief Complaint  Patient presents with   Weakness     Brief history of present illness from the day of admission and additional interim summary    58 y.o. male with medical history significant of DM1, HTN, HLD p/w diarrhea for the past 3 weeks and found to have AKI iso CDI.   The patient experienced diarrhea for three weeks, starting before going on a cruise. The patient noted the diarrhea worsened during the cruise, culminating in a syncopal episode on August 28th while touring in [place], leading to hospital admission for dehydration. The patient reported having about 15 liquid and green bowel movements per day. During the hospital stay in [place], blood work and stool samples were performed, with no infections found, and a chest x-ray was done. After returning home, the diarrhea decreased to once a day with the use of Imodium four times daily, and a five day course of Cipro/Flagyl  which the pt finished on 9/3. The diarrhea symptoms escalated again on September 10th, with approximately five loose bowel movements occurring since 6 PM so the pt came back to the ED for evaluation.                                                                 Hospital Course   Sepsis  due to acute C diff colitis -was placed on p.o. vancomycin  along with IV fluids, symptom-free now stools are formed, will be getting 7 more days of oral vancomycin  completing a 10-day course with outpatient PCP and GI follow-up.  He is now back to his baseline and eager to go home.   Severe dehydration, hypokalemia and AKI due to diarrhea.  Solved after hydration, electrolytes were replaced.   DM1 -PTA insulin  pump per protocol    Discharge diagnosis     Principal Problem:   Sepsis [facility])    Discharge instructions    Discharge Instructions     Ambulatory Referral for Lung Cancer Scre   Complete by: As directed    Diet - low sodium heart healthy   Complete by: As directed    Discharge instructions   Complete by: As directed    Kindly keep your belongings separate from  family members for a week, maintain strict hygiene, anyone coming in contact with your stool can get this C. difficile infection.  Practice this strictly for 1 week.  Follow with Primary MD Bevely Doffing, FNP in 7 days, get a referral for GI follow-up within a week of discharge.  Get CBC, CMP, Magnesium   -  checked next visit with your primary MD    Activity: As tolerated with Full fall precautions use walker/cane & assistance as needed  Disposition Home   Diet: Heart Healthy  Low Carb, check CBGs q. ACHS  Special Instructions: If you have smoked or chewed Tobacco  in the last 2 yrs please stop smoking, stop any regular Alcohol  and or any Recreational drug use.  On your next visit with your primary care physician please Get Medicines reviewed and adjusted.  Please request your Prim.MD to go over all Hospital Tests and Procedure/Radiological results at the follow up, please get all Hospital records sent to your Prim MD by signing hospital release before you go home.  If you experience worsening of your admission symptoms, develop shortness of breath, life threatening emergency, suicidal or homicidal thoughts  you must seek medical attention immediately by calling 911 or calling your MD immediately  if symptoms less severe.  You Must read complete instructions/literature along with all the possible adverse reactions/side effects for all the Medicines you take and that have been prescribed to you. Take any new Medicines after you have completely understood and accpet all the possible adverse reactions/side effects.   Do not drive when taking Pain medications.  Do not take more than prescribed Pain, Sleep and Anxiety Medications  Wear Seat belts while driving.   Increase activity slowly   Complete by: As directed        Discharge Medications   Allergies as of 09/29/2023       Reactions   Crestor  [rosuvastatin  Calcium ] Other (See Comments)   Muscle aches   Iron Other (See Comments)   Tore up my stomach  Liquid Iron   Lipitor [atorvastatin  Calcium ] Other (See Comments)   Muscles aches   Statins Other (See Comments)   Muscle aches        Medication List     TAKE these medications    amLODipine  5 MG tablet Commonly known as: NORVASC  TAKE 1 TABLET(5 MG) BY MOUTH DAILY What changed:  how much to take how to take this when to take this   chlorthalidone  25 MG tablet Commonly known as: HYGROTON  Take 1 tablet (25 mg total) by mouth daily.   Dexcom G7 Sensor Misc Use as directed and change every 10 days.   esomeprazole  40 MG capsule Commonly known as: NEXIUM  Take 1 capsule (40 mg total) by mouth 2 (two) times daily before a meal.   insulin  lispro 100 UNIT/ML injection Commonly known as: HUMALOG  Use with Omnipod for total daily dose of 50 units per day.   olmesartan  40 MG tablet Commonly known as: BENICAR  Take 1 tablet (40 mg total) by mouth daily.   Omnipod 5 DexG7G6 Intro Gen 5 Kit Change pod every 48-72 hrs   Omnipod 5 DexG7G6 Pods Gen 5 Misc CHANGE POD EVERY 48 TO 72 HOURS   Omnipod 5 DexG7G6 Pods Gen 5 Misc Change pod every 48 to 72 hours   ondansetron  4 MG  tablet Commonly known as: ZOFRAN  Take 1 tablet (4 mg total) by mouth every 8 (eight) hours as needed for nausea or vomiting.   Repatha  140 MG/ML  Sosy Generic drug: Evolocumab  Inject 140 mg into the skin every 14 (fourteen) days.   Repatha  140 MG/ML Sosy Generic drug: Evolocumab  Inject 140 mg into the skin every 14 (fourteen) days.   triamcinolone  cream 0.1 % Commonly known as: KENALOG  Apply topically 2 (two) times daily.   vancomycin  125 MG capsule Commonly known as: VANCOCIN  Take 1 capsule (125 mg total) by mouth 4 (four) times daily for 7 days.         Follow-up Information     Bevely Doffing, FNP. Schedule an appointment as soon as possible for a visit in 1 week(s).   Specialty: Family Medicine Contact information: [address] [place] [address] [phone]         Elicia Claw, MD. Schedule an appointment as soon as possible for a visit in 1 week(s).   Specialty: Gastroenterology Why: Colitis Contact information: [address] [place] [address] [phone]                 Major procedures and Radiology Reports - PLEASE review detailed and final reports thoroughly  -      CT ABDOMEN PELVIS W CONTRAST Result Date: 09/26/2023 CLINICAL DATA:  Sepsis EXAM: CT ABDOMEN AND PELVIS WITH CONTRAST TECHNIQUE: Multidetector CT imaging of the abdomen and pelvis was performed using the standard protocol following bolus administration of intravenous contrast. RADIATION DOSE REDUCTION: This exam was performed according to the departmental dose-optimization program which includes automated exposure control, adjustment of the mA and/or kV according to patient size and/or use of iterative reconstruction technique. CONTRAST:  80mL OMNIPAQUE  IOHEXOL  300 MG/ML  SOLN COMPARISON:  05/22/2011 FINDINGS: Lower chest: Calcifications in the right coronary artery. Dependent atelectasis in the lower lobes. No effusions. Hepatobiliary: No focal  hepatic abnormality. Gallbladder unremarkable. Pancreas: No focal abnormality or ductal dilatation. Spleen: No focal abnormality.  Normal size. Adrenals/Urinary Tract: Mild bilateral perinephric stranding is new since prior study, nonspecific. No adrenal or renal mass. No stones or hydronephrosis. Urinary bladder unremarkable. Stomach/Bowel: Left colonic diverticulosis. There appears to be mild wall thickening involving the descending colon and sigmoid colon suggesting colitis. No bowel obstruction. Stomach and small bowel decompressed, unremarkable. Normal appendix. Vascular/Lymphatic: Aortic atherosclerosis. No evidence of aneurysm or adenopathy. Reproductive: No visible focal abnormality. Other: No free fluid or free air. Musculoskeletal: No acute bony abnormality. IMPRESSION: Mild apparent wall thickening in the descending colon and sigmoid colon concerning for colitis. Scattered left colonic diverticulosis. Dependent atelectasis in the lower lobes. Aortic atherosclerosis. Electronically Signed   By: Franky Crease M.D.   On: 09/26/2023 21:17   DG Chest Port 1 View if patient is in a treatment room. Result Date: 09/26/2023 CLINICAL DATA:  Suspected sepsis. EXAM: PORTABLE CHEST 1 VIEW COMPARISON:  Chest radiograph dated 06/02/2021. FINDINGS: No focal consolidation, pleural effusion, or pneumothorax. The cardiac silhouette is within normal limits. No acute osseous pathology. IMPRESSION: No active disease. Electronically Signed   By: Vanetta Chou M.D.   On: 09/26/2023 19:12    Micro Results    Recent Results (from the past 240 hours)  Culture, blood (Routine x 2)     Status: None (Preliminary result)   Collection Time: 09/26/23  6:47 PM   Specimen: BLOOD LEFT ARM  Result Value Ref Range Status   Specimen Description   Final    BLOOD LEFT ARM Performed at [facility], [address] [place], [place] [address]    Special Requests   Final  Blood Culture results may not be optimal due to  an inadequate volume of blood received in culture bottles BOTTLES DRAWN AEROBIC AND ANAEROBIC Performed at [facility], [address], [place], [place] [address]    Culture   Final    NO GROWTH 2 DAYS Performed at [facility], [address], [place], [place] [address]    Report Status PENDING  Incomplete  Culture, blood (Routine x 2)     Status: None (Preliminary result)   Collection Time: 09/26/23  7:59 PM   Specimen: Right Antecubital; Blood  Result Value Ref Range Status   Specimen Description   Final    RIGHT ANTECUBITAL Performed at [facility], [address], [place], [place] [address]    Special Requests   Final    BOTTLES DRAWN AEROBIC AND ANAEROBIC Blood Culture adequate volume Performed at [facility], [address], [place], [place] [address]    Culture   Final    NO GROWTH 2 DAYS Performed at [facility], [address], [place], [place] [address]    Report Status PENDING  Incomplete  Resp panel by RT-PCR (RSV, Flu A&B, Covid) Anterior Nasal Swab     Status: None   Collection Time: 09/26/23  8:01 PM   Specimen: Anterior Nasal Swab  Result Value Ref Range Status   SARS Coronavirus 2 by RT PCR NEGATIVE NEGATIVE Final    Comment: (NOTE) SARS-CoV-2 target nucleic acids are NOT DETECTED.  The SARS-CoV-2 RNA is generally detectable in upper respiratory specimens during the acute phase of infection. The lowest concentration of SARS-CoV-2 viral copies this assay can detect is 138 copies/mL. A negative result does not preclude SARS-Cov-2 infection and should not be used as the sole basis for treatment or other patient management decisions. A negative result may occur with  improper specimen collection/handling, submission of specimen other than nasopharyngeal swab, presence of viral mutation(s) within the areas targeted by this assay, and inadequate number of viral copies(<138 copies/mL). A  negative result must be combined with clinical observations, patient history, and epidemiological information. The expected result is Negative.  Fact Sheet for Patients:  BloggerCourse.com  Fact Sheet for Healthcare Providers:  SeriousBroker.it  This test is no t yet approved or cleared by the [place]  FDA and  has been authorized for detection and/or diagnosis of SARS-CoV-2 by FDA under an Emergency Use Authorization (EUA). This EUA will remain  in effect (meaning this test can be used) for the duration of the COVID-19 declaration under Section 564(b)(1) of the Act, 21 U.S.C.section 360bbb-3(b)(1), unless the authorization is terminated  or revoked sooner.       Influenza A by PCR NEGATIVE NEGATIVE Final   Influenza B by PCR NEGATIVE NEGATIVE Final    Comment: (NOTE) The Xpert Xpress SARS-CoV-2/FLU/RSV plus assay is intended as an aid in the diagnosis of influenza from Nasopharyngeal swab specimens and should not be used as a sole basis for treatment. Nasal washings and aspirates are unacceptable for Xpert Xpress SARS-CoV-2/FLU/RSV testing.  Fact Sheet for Patients: BloggerCourse.com  Fact Sheet for Healthcare Providers: SeriousBroker.it  This test is not yet approved or cleared by the [place]  FDA and has been authorized for detection and/or diagnosis of SARS-CoV-2 by FDA under an Emergency Use Authorization (EUA). This EUA will remain in effect (meaning this test can be used) for the duration of the COVID-19 declaration under Section 564(b)(1) of the Act, 21 U.S.C. section 360bbb-3(b)(1), unless the authorization  is terminated or revoked.     Resp Syncytial Virus by PCR NEGATIVE NEGATIVE Final    Comment: (NOTE) Fact Sheet for Patients: BloggerCourse.com  Fact Sheet for Healthcare  Providers: SeriousBroker.it  This test is not yet approved or cleared by the [place]  FDA and has been authorized for detection and/or diagnosis of SARS-CoV-2 by FDA under an Emergency Use Authorization (EUA). This EUA will remain in effect (meaning this test can be used) for the duration of the COVID-19 declaration under Section 564(b)(1) of the Act, 21 U.S.C. section 360bbb-3(b)(1), unless the authorization is terminated or revoked.  Performed at [facility], [address] [place], [place] [address]   Gastrointestinal Panel by PCR , Stool     Status: None   Collection Time: 09/26/23 10:41 PM   Specimen: Stool  Result Value Ref Range Status   Campylobacter species NOT DETECTED NOT DETECTED Final   Plesimonas shigelloides NOT DETECTED NOT DETECTED Final   Salmonella species NOT DETECTED NOT DETECTED Final   Yersinia enterocolitica NOT DETECTED NOT DETECTED Final   Vibrio species NOT DETECTED NOT DETECTED Final   Vibrio cholerae NOT DETECTED NOT DETECTED Final   Enteroaggregative E coli (EAEC) NOT DETECTED NOT DETECTED Final   Enteropathogenic E coli (EPEC) NOT DETECTED NOT DETECTED Final   Enterotoxigenic E coli (ETEC) NOT DETECTED NOT DETECTED Final   Shiga like toxin producing E coli (STEC) NOT DETECTED NOT DETECTED Final   Shigella/Enteroinvasive E coli (EIEC) NOT DETECTED NOT DETECTED Final   Cryptosporidium NOT DETECTED NOT DETECTED Final   Cyclospora cayetanensis NOT DETECTED NOT DETECTED Final   Entamoeba histolytica NOT DETECTED NOT DETECTED Final   Giardia lamblia NOT DETECTED NOT DETECTED Final   Adenovirus F40/41 NOT DETECTED NOT DETECTED Final   Astrovirus NOT DETECTED NOT DETECTED Final   Norovirus GI/GII NOT DETECTED NOT DETECTED Final   Rotavirus A NOT DETECTED NOT DETECTED Final   Sapovirus (I, II, IV, and V) NOT DETECTED NOT DETECTED Final    Comment: Performed at [facility], [address]  [place], [place] [address]  C Difficile Quick Screen w PCR reflex     Status: Abnormal   Collection Time: 09/26/23 10:42 PM   Specimen: Stool  Result Value Ref Range Status   C Diff antigen POSITIVE (A) NEGATIVE Final   C Diff toxin POSITIVE (A) NEGATIVE Final   C Diff interpretation Toxin producing C. difficile detected.  Corrected    Comment: CRITICAL RESULT CALLED TO, READ BACK BY AND VERIFIED WITH: RN LESLIE ROSE ON 09/27/23 @ 1258 BU DRT Performed at [facility], [address], [place], [place] [address] CORRECTED ON 09/11 AT 1258: PREVIOUSLY REPORTED AS Toxin producing C. difficile detected. CRITICAL RESULT CALLED TO, READ BACK BY AND VERIFIED WITH: RN      Today   Subjective    Jaquay Posthumus today has no headache,no chest abdominal pain,no new weakness tingling or numbness, feels much better wants to go home today.    Objective   Blood pressure (!) 148/80, pulse (!) 58, temperature 98.2 F (36.8 C), temperature source Oral, resp. rate 18, SpO2 97%.   Intake/Output Summary (Last 24 hours) at 09/29/2023 0759 Last data filed at 09/29/2023 0429 Gross per 24 hour  Intake 771.95 ml  Output --  Net 771.95 ml    Exam  Awake Alert, No new F.N deficits,    [place].AT,PERRAL Supple Neck,   Symmetrical Chest wall movement, Good air movement bilaterally, CTAB RRR,No Gallops,   +  ve B.Sounds, Abd Soft, Non tender,  No Cyanosis, Clubbing or edema    Data Review   Recent Labs  Lab 09/26/23 1855 09/28/23 0621 09/29/23 0235  WBC 27.1* 13.1* 7.6  HGB 12.2* 11.2* 10.6*  HCT 33.8* 31.9* 31.1*  PLT 404* 327 314  MCV 89.4 91.9 93.4  MCH 32.3 32.3 31.8  MCHC 36.1* 35.1 34.1  RDW 12.6 12.7 12.5  LYMPHSABS 0.8 1.2 1.7  MONOABS 1.4* 0.8 0.6  EOSABS 0.0 0.4 0.4  BASOSABS 0.1 0.1 0.1    Recent Labs  Lab 09/26/23 1855 09/26/23 2121 09/28/23 0621 09/29/23 0235  NA 123*  --  136 134*  K 4.3  --  3.4* 3.7  CL 86*  --  98 101  CO2 19*  --  25 26  ANIONGAP 18*  --  13 7   GLUCOSE 128*  --  143* 217*  BUN 20  --  18 16  CREATININE 1.36*  --  1.19 1.03  AST 32  --   --   --   ALT 32  --   --   --   ALKPHOS 143*  --   --   --   BILITOT 0.3  --   --   --   ALBUMIN 3.7  --   --   --   CRP  --   --  16.3*  --   PROCALCITON  --   --  3.47  --   LATICACIDVEN 1.8 0.7  --   --   INR 0.9  --   --   --   MG  --   --  1.8 1.9  PHOS  --   --  3.3  --   CALCIUM  8.8*  --  8.3* 8.0*    Total Time in preparing paper work, data evaluation and todays exam - 35 minutes  Signature  -    Lavada Stank M.D on 09/29/2023 at 7:59 AM   -  To page go to [email or web address]

## 2023-09-29 NOTE — Discharge Instructions (Addendum)
 Kindly keep your belongings separate from family members for a week, maintain strict hygiene, anyone coming in contact with your stool can get this C. difficile infection.  Practice this strictly for 1 week.  Follow with Primary MD Bevely Doffing, FNP in 7 days, get a referral for GI follow-up within a week of discharge.  Get CBC, CMP, Magnesium   -  checked next visit with your primary MD    Activity: As tolerated with Full fall precautions use walker/cane & assistance as needed  Disposition Home   Diet: Heart Healthy  Low Carb, check CBGs q. ACHS  Special Instructions: If you have smoked or chewed Tobacco  in the last 2 yrs please stop smoking, stop any regular Alcohol  and or any Recreational drug use.  On your next visit with your primary care physician please Get Medicines reviewed and adjusted.  Please request your Prim.MD to go over all Hospital Tests and Procedure/Radiological results at the follow up, please get all Hospital records sent to your Prim MD by signing hospital release before you go home.  If you experience worsening of your admission symptoms, develop shortness of breath, life threatening emergency, suicidal or homicidal thoughts you must seek medical attention immediately by calling 911 or calling your MD immediately  if symptoms less severe.  You Must read complete instructions/literature along with all the possible adverse reactions/side effects for all the Medicines you take and that have been prescribed to you. Take any new Medicines after you have completely understood and accpet all the possible adverse reactions/side effects.   Do not drive when taking Pain medications.  Do not take more than prescribed Pain, Sleep and Anxiety Medications  [facility] [address]  [email or web address]  [employer], [address] [place] phone [phone]  [facility] (Aging, Disability and Transient Services), [address], [place], [place], [place]  .ph. [phone]  Handles transportation with [facility], handles in home care for hire (CNA and sitter services), handles information about ramp construction Bobbetta Ana is contact). Has information about Medicaid transport assistance.  [facility], [address], [place] phone: [phone].  [facility], [address] [place], Marval Monte, Admission Director. [phone]  [facility], [address], [place]. Ph. [phone]  [facility], [address] [place] phone: [phone]  [facility], [phone] [place]. [address], [place] [phone] Corina Blumenthal CSW [phone].  [address], [address], [place] ph. [phone]. [phone]. Has pharmacy at site and also has a DME equipment section in store to allow folks to see DME items or to order DME items.  [facility], [address] [place] ph. [phone].  [facility], [address], [place] Ph.[phone] Mental Health services for [place], psychiatric services, group counseling services.  [facility], [address] [place], [place] ph. [phone].  [facility] provides Individual counseling only. Does not have Group counseling. Can go with MD referral or potential clients can call to inquire about self referral  [facility] (food pantry), [address], [place] phone [phone].  [facility], [address], [place] ph. [phone]  Health Dept also has a community dental clinic.  [facility], [address] ,[place], [place] [address] phone [phone]  [facility], [address] [place] , [place] phone [phone]  [facility] has a few private rooms but all the complex  is ALF level of care.  [address] [place], [place] [address], [place], is Health and safety inspector. Her number is  [phone]  .  [facility] [place] [address] [place]. Ph. [phone].  [facility], [address] [place], [place]. Ph. [phone]  [facility], [address] [place], [place] ph. [phone]  [facility], [address] [place], [place] ph [place]. [phone]  [facility], [address], [place], [place] phone [phone]  [facility], [address], [place] phone [phone]  [facility] has ALF capability and also has an Alzheimers unit for clients.  Primary school teacher, [address],, [place] phone [phone] 0r [phone].  [facility], [address] [place]  [place] ph. [phone].  [facility], [address] [place] ph. [phone]  [employer] [place] [facility] [employer], [address] [place]. General contact phone number is [phone]. Phone number for contact Rosina Fell, director of LEAF center at [facility] is: Rosina Fell: [phone]  [facility], Director [address] [place] [address] [phone]  [facility], [address], [place]. [phone].  Social Security Admin, [address], [place], [place] ey.[phone]  [employer], [address], [place] phone [phone]  [employer], [place], [address], [place], [place] ph. [phone].   [facility] works with regularly in [place], [place]  Dr. Christyne Hurst [phone]  Dr. Rollene Pesa, [facility]. [phone]  Dr. Gaither Langton [phone]  Dr. Buell Gelineau, pulmonologist, [phone]  Dr. Norleen Stable, orthopedist, [phone].  Dr. Jerilynn Carnes, [facility], [phone]  Dr. JUDITHANN Earl, endocrinologist, [phone]  Dr. Taft Minerva, orthopedics, [phone]  Dr. Ninetta Sax  [facility]), [phone]  Dr. Butler Burr [employer] [facility]) [phone]  Dr. Jerel Sieving, [facility], [phone]  Dr. Benita Outhouse, [phone]     Dr. Yancey Reno, [address]   [facility]  [facility], [address] [place] [address] ph. [phone]  [facility], [address] [place]  [place], [place] phone: [phone]  [facility] , [address]  [place], [place], [place] phone [phone].  [employer], [place], [address] Conservator, museum/gallery. [place], [place] phone: [phone].Wear Seat belts while driving.

## 2023-10-01 ENCOUNTER — Telehealth: Payer: Self-pay

## 2023-10-01 ENCOUNTER — Other Ambulatory Visit (HOSPITAL_BASED_OUTPATIENT_CLINIC_OR_DEPARTMENT_OTHER): Payer: Self-pay

## 2023-10-01 LAB — CULTURE, BLOOD (ROUTINE X 2)
Culture: NO GROWTH
Culture: NO GROWTH
Special Requests: ADEQUATE

## 2023-10-01 MED ORDER — DEXCOM G7 SENSOR MISC: 3 refills | Status: DC

## 2023-10-01 NOTE — Addendum Note (Signed)
 Addended by: Neisha Hinger J on: 10/01/2023 06:48 AM   Modules accepted: Orders

## 2023-10-01 NOTE — Transitions of Care (Post Inpatient/ED Visit) (Signed)
   10/01/2023  Name: [name] MRN: [id number] DOB: 1965-05-22  Today's TOC FU Call Status: Today's TOC FU Call Status:: Unsuccessful Call (1st Attempt) Unsuccessful Call (1st Attempt) Date: 10/01/23  Attempted to reach the patient regarding the most recent Inpatient/ED visit.  Follow Up Plan: Additional outreach attempts will be made to reach the patient to complete the Transitions of Care (Post Inpatient/ED visit) call.   Arvin Seip RN, BSN, CCM [employer], Population Health Case Manager Phone: [phone]

## 2023-10-02 ENCOUNTER — Telehealth: Payer: Self-pay

## 2023-10-02 NOTE — Transitions of Care (Post Inpatient/ED Visit) (Signed)
   10/02/2023  Name: [name] MRN: [id number] DOB: 19-Oct-1965  Today's TOC FU Call Status: Today's TOC FU Call Status:: Unsuccessful Call (2nd Attempt) Unsuccessful Call (2nd Attempt) Date: 10/02/23  Attempted to reach the patient regarding the most recent Inpatient/ED visit.  Follow Up Plan: Additional outreach attempts will be made to reach the patient to complete the Transitions of Care (Post Inpatient/ED visit) call.   Arvin Seip RN, BSN, CCM [employer], Population Health Case Manager Phone: [phone]

## 2023-10-03 ENCOUNTER — Telehealth: Payer: Self-pay

## 2023-10-03 NOTE — Transitions of Care (Post Inpatient/ED Visit) (Signed)
   10/03/2023  Name: RIYAD KEENA MRN: [id number] DOB: [birth year]  Today's TOC FU Call Status: Today's TOC FU Call Status:: Unsuccessful Call (3rd Attempt) Unsuccessful Call (3rd Attempt) Date: 10/03/23  Attempted to reach the patient regarding the most recent Inpatient/ED visit.  Follow Up Plan: No further outreach attempts will be made at this time. We have been unable to contact the patient.  Demarrio Menges J. Ethaniel Garfield RN, MSN [place]  [facility], [facility] RN Care Manager Direct Dial : [phone]  Fax: [phone] Website: [email or web address]

## 2023-10-04 ENCOUNTER — Telehealth: Payer: Self-pay

## 2023-10-04 NOTE — Telephone Encounter (Signed)
 Can be done at appt with patel next week

## 2023-10-04 NOTE — Telephone Encounter (Signed)
 Copied from CRM [id number]. Topic: Clinical - Request for Lab/Test Order >> Oct 04, 2023  2:50 PM Debby BROCKS wrote: Reason for CRM: Patient got antibiotics for a CT scan he recently had and would like blood work to be scheduled to make sure everything is gone from his system and to make sure he doesn't need the antibiotics anymore

## 2023-10-05 ENCOUNTER — Telehealth: Payer: Self-pay

## 2023-10-05 ENCOUNTER — Inpatient Hospital Stay: Admitting: Nurse Practitioner

## 2023-10-05 NOTE — Telephone Encounter (Signed)
 Copied from CRM [id number]. Topic: Clinical - Request for Lab/Test Order >> Oct 04, 2023  2:50 PM Debby BROCKS wrote: Reason for CRM: Patient got antibiotics for a CT scan he recently had and would like blood work to be scheduled to make sure everything is gone from his system and to make sure he doesn't need the antibiotics anymore >> Oct 05, 2023 10:15 AM Montie POUR wrote: Robey would like to come today or Monday to test for acute C diff colitis since he tested positive on 09/26/23. He wants to make sure he does not need more medication. Please call him at [phone] to discuss.

## 2023-10-10 ENCOUNTER — Ambulatory Visit (INDEPENDENT_AMBULATORY_CARE_PROVIDER_SITE_OTHER): Admitting: Internal Medicine

## 2023-10-10 ENCOUNTER — Encounter: Payer: Self-pay | Admitting: Internal Medicine

## 2023-10-10 VITALS — BP 134/72 | HR 80 | Ht 70.0 in | Wt 171.0 lb

## 2023-10-10 DIAGNOSIS — A0472 Enterocolitis due to Clostridium difficile, not specified as recurrent: Secondary | ICD-10-CM | POA: Diagnosis not present

## 2023-10-10 DIAGNOSIS — I152 Hypertension secondary to endocrine disorders: Secondary | ICD-10-CM

## 2023-10-10 DIAGNOSIS — E1065 Type 1 diabetes mellitus with hyperglycemia: Secondary | ICD-10-CM

## 2023-10-10 DIAGNOSIS — A419 Sepsis, unspecified organism: Secondary | ICD-10-CM | POA: Diagnosis not present

## 2023-10-10 DIAGNOSIS — Z09 Encounter for follow-up examination after completed treatment for conditions other than malignant neoplasm: Secondary | ICD-10-CM

## 2023-10-10 NOTE — Assessment & Plan Note (Addendum)
 Had diarrhea and severe dehydration after recently taking ciprofloxacin/Flagyl  Was given oral vancomycin  during recent hospitalization Symptoms improved now Advised to take probiotic for 2 weeks Recheck CBC and CMP

## 2023-10-10 NOTE — Progress Notes (Signed)
 Established Patient Office Visit  Subjective:  Patient ID: [name], male    DOB: 02-Mar-1965  Age: 58 y.o. MRN: [id number]  CC:  Chief Complaint  Patient presents with   Hospitalization Follow-up    HPI HAI Golden is a 57 y.o. male with past medical history of DM1, HTN, HLD who presents for follow-up after recent hospitalization.  He p/w diarrhea for the past 3 weeks and found to have AKI. The patient experienced diarrhea for three weeks, starting before going on a cruise. The patient noted the diarrhea worsened during the cruise, culminating in a syncopal episode on August 28th while touring in [place], leading to hospital admission for dehydration. The patient reported having about 15 liquid and green bowel movements per day. During the hospital stay in [place], blood work and stool samples were performed, with no infections found, and a chest x-ray was done. After returning home, the diarrhea decreased to once a day with the use of Imodium four times daily, and a five day course of Cipro/Flagyl  which the pt finished on 9/3. The diarrhea symptoms escalated again on September 10th, with approximately five loose bowel movements occurring since 6 PM so the pt came back to the ED for evaluation. He was found to have C. Diff. Colitis. He was placed on oral vancomycin , which he has completed now.  Today, he reports feeling better.  Denies having watery diarrhea, but still has loose BM at times.  Denies melena or hematochezia.  HTN: He was told to stop olmesartan  from hospital due to borderline low BP.  His BP was WNL today.  Denies any chest pain, dyspnea or palpitations.  He reports chronic low back pain, has a history of lumbar spine surgery and has gait disturbance due to it.  He requests handicap placard form, which was filled out and provided today.   Past Medical History:  Diagnosis Date   Allergy    Anemia    Diabetes mellitus without complication (HCC)    type 1 per patient    GERD (gastroesophageal reflux disease)    HLD (hyperlipidemia)    Hypertension     Past Surgical History:  Procedure Laterality Date   ANKLE SURGERY Right    LUMBAR LAMINECTOMY/DECOMPRESSION MICRODISCECTOMY N/A 10/07/2021   Procedure: Microlumbar decompression L4-5, possible L5-S1;  Surgeon: Duwayne Purchase, MD;  Location: [facility];  Service: Orthopedics;  Laterality: N/A;   UPPER GASTROINTESTINAL ENDOSCOPY     VASECTOMY  1994    Family History  Problem Relation Age of Onset   COPD Mother    Diabetes Mother    Early death Father 22       MI   Heart attack Father    Lymphoma Sister 53   Hypertension Brother    Diabetes Maternal Grandmother    Stroke Neg Hx    Alcohol abuse Neg Hx    Drug abuse Neg Hx    Hyperlipidemia Neg Hx    Kidney disease Neg Hx    Colon cancer Neg Hx    Esophageal cancer Neg Hx    Stomach cancer Neg Hx    Rectal cancer Neg Hx     Social History   Socioeconomic History   Marital status: Married    Spouse name: Not on file   Number of children: 1   Years of education: Not on file   Highest education level: Associate degree: occupational, Scientist, product/process development, or vocational program  Occupational History   Occupation: Holiday representative  Tobacco Use  Smoking status: Every Day    Current packs/day: 1.00    Average packs/day: 1 pack/day for 30.0 years (30.0 ttl pk-yrs)    Types: Cigarettes   Smokeless tobacco: Never  Vaping Use   Vaping status: Never Used  Substance and Sexual Activity   Alcohol use: Not Currently    Alcohol/week: 0.0 - 8.0 standard drinks of alcohol   Drug use: No   Sexual activity: Yes  Other Topics Concern   Not on file  Social History Narrative   Not on file   Social Drivers of Health   Financial Resource Strain: High Risk (09/11/2022)   Overall Financial Resource Strain (CARDIA)    Difficulty of Paying Living Expenses: Hard  Food Insecurity: Patient Declined (09/27/2023)   Hunger Vital Sign    Worried About Programme researcher, broadcasting/film/video in the  Last Year: Patient declined    Ran Out of Food in the Last Year: Patient declined  Transportation Needs: Patient Declined (09/27/2023)   PRAPARE - Administrator, Civil Service (Medical): Patient declined    Lack of Transportation (Non-Medical): Patient declined  Physical Activity: Not on file  Stress: Not on file  Social Connections: Unknown (09/11/2022)   Social Connection and Isolation Panel    Frequency of Communication with Friends and Family: Not on file    Frequency of Social Gatherings with Friends and Family: Not on file    Attends Religious Services: Not on file    Active Member of Clubs or Organizations: No    Attends Banker Meetings: Not on file    Marital Status: Married  Intimate Partner Violence: Patient Declined (09/27/2023)   Humiliation, Afraid, Rape, and Kick questionnaire    Fear of Current or Ex-Partner: Patient declined    Emotionally Abused: Patient declined    Physically Abused: Patient declined    Sexually Abused: Patient declined    Outpatient Medications Prior to Visit  Medication Sig Dispense Refill   amLODipine  (NORVASC ) 5 MG tablet TAKE 1 TABLET(5 MG) BY MOUTH DAILY (Patient taking differently: Take 5 mg by mouth daily. TAKE 1 TABLET(5 MG) BY MOUTH DAILY) 90 tablet 1   Continuous Glucose Sensor (DEXCOM G7 SENSOR) MISC Use to monitor blood sugar continuously. Change sensor every 10 days as directed. 9 each 3   Continuous Glucose Sensor (DEXCOM G7 SENSOR) MISC Use as directed and change every 10 days. 9 each 3   esomeprazole  (NEXIUM ) 40 MG capsule Take 1 capsule (40 mg total) by mouth 2 (two) times daily before a meal. 180 capsule 1   Evolocumab  (REPATHA ) 140 MG/ML SOSY Inject 140 mg into the skin every 14 (fourteen) days. (Patient not taking: Reported on 09/27/2023) 2 mL 5   Evolocumab  (REPATHA ) 140 MG/ML SOSY Inject 140 mg into the skin every 14 (fourteen) days. (Patient not taking: Reported on 09/27/2023) 2 mL 0   Insulin  Disposable  Pump (OMNIPOD 5 DEXG7G6 PODS GEN 5) MISC CHANGE POD EVERY 48 TO 72 HOURS 10 each 6   Insulin  Disposable Pump (OMNIPOD 5 DEXG7G6 PODS GEN 5) MISC Change pod every 48 to 72 hours 10 each 4   Insulin  Disposable Pump (OMNIPOD 5 G6 INTRO, GEN 5,) KIT Change pod every 48-72 hrs 1 kit 0   insulin  lispro (HUMALOG ) 100 UNIT/ML injection Use with Omnipod for total daily dose of 50 units per day. 60 mL 0   olmesartan  (BENICAR ) 40 MG tablet Take 1 tablet (40 mg total) by mouth daily. (Patient not taking: Reported on  09/21/2023) 90 tablet 0   ondansetron  (ZOFRAN ) 4 MG tablet Take 1 tablet (4 mg total) by mouth every 8 (eight) hours as needed for nausea or vomiting. 20 tablet 0   triamcinolone  cream (KENALOG ) 0.1 % Apply topically 2 (two) times daily. (Patient not taking: Reported on 09/27/2023) 15 g 2   chlorthalidone  (HYGROTON ) 25 MG tablet Take 1 tablet (25 mg total) by mouth daily. 90 tablet 0   No facility-administered medications prior to visit.    Allergies  Allergen Reactions   Crestor  [Rosuvastatin  Calcium ] Other (See Comments)    Muscle aches   Iron Other (See Comments)    Tore up my stomach  Liquid Iron   Lipitor [Atorvastatin  Calcium ] Other (See Comments)    Muscles aches   Statins Other (See Comments)    Muscle aches    ROS Review of Systems  Constitutional:  Negative for chills and fever.  HENT:  Negative for congestion and sore throat.   Eyes:  Negative for pain and discharge.  Respiratory:  Negative for cough and shortness of breath.   Cardiovascular:  Negative for chest pain and palpitations.  Gastrointestinal:  Negative for abdominal pain, diarrhea, nausea and vomiting.  Endocrine: Negative for polydipsia and polyuria.  Genitourinary:  Negative for dysuria and hematuria.  Musculoskeletal:  Positive for back pain and gait problem. Negative for neck pain and neck stiffness.  Skin:  Negative for rash.  Neurological:  Negative for dizziness and weakness.  Psychiatric/Behavioral:   Negative for agitation and behavioral problems.       Objective:    Physical Exam Vitals reviewed.  Constitutional:      General: He is not in acute distress.    Appearance: He is not diaphoretic.  HENT:     Head: Normocephalic and atraumatic.     Nose: Nose normal.     Mouth/Throat:     Mouth: Mucous membranes are moist.  Eyes:     General: No scleral icterus.    Extraocular Movements: Extraocular movements intact.  Cardiovascular:     Rate and Rhythm: Normal rate and regular rhythm.     Heart sounds: Normal heart sounds. No murmur heard. Pulmonary:     Breath sounds: Normal breath sounds. No wheezing or rales.  Abdominal:     General: Bowel sounds are normal.     Palpations: Abdomen is soft.     Tenderness: There is no abdominal tenderness.     Comments: Omnipod in place  Musculoskeletal:     Cervical back: Neck supple. No tenderness.     Right lower leg: No edema.     Left lower leg: No edema.  Skin:    General: Skin is warm.     Findings: No rash.  Neurological:     General: No focal deficit present.     Mental Status: He is alert and oriented to person, place, and time.     Sensory: No sensory deficit.     Motor: No weakness.  Psychiatric:        Mood and Affect: Mood normal.        Behavior: Behavior normal.     BP 134/72   Pulse 80   Ht 5' 10 (1.778 m)   Wt 171 lb (77.6 kg)   SpO2 98%   BMI 24.54 kg/m  Wt Readings from Last 3 Encounters:  10/10/23 171 lb (77.6 kg)  09/29/23 168 lb 14 oz (76.6 kg)  09/21/23 169 lb (76.7 kg)    Lab Results  Component Value Date   TSH 2.010 02/21/2022   Lab Results  Component Value Date   WBC 7.6 09/29/2023   HGB 10.6 (L) 09/29/2023   HCT 31.1 (L) 09/29/2023   MCV 93.4 09/29/2023   PLT 314 09/29/2023   Lab Results  Component Value Date   NA 134 (L) 09/29/2023   K 3.7 09/29/2023   CO2 26 09/29/2023   GLUCOSE 217 (H) 09/29/2023   BUN 16 09/29/2023   CREATININE 1.03 09/29/2023   BILITOT 0.3 09/26/2023    ALKPHOS 143 (H) 09/26/2023   AST 32 09/26/2023   ALT 32 09/26/2023   PROT 6.8 09/26/2023   ALBUMIN 3.7 09/26/2023   CALCIUM  8.0 (L) 09/29/2023   ANIONGAP 7 09/29/2023   EGFR 73 09/21/2023   GFR 85.15 02/02/2021   Lab Results  Component Value Date   CHOL 167 09/24/2023   Lab Results  Component Value Date   HDL 52 09/24/2023   Lab Results  Component Value Date   LDLCALC 91 09/24/2023   Lab Results  Component Value Date   TRIG 135 09/24/2023   Lab Results  Component Value Date   CHOLHDL 3.2 09/24/2023   Lab Results  Component Value Date   HGBA1C 8.1 (A) 07/24/2023      Assessment & Plan:   Problem List Items Addressed This Visit       Cardiovascular and Mediastinum   Hypertension associated with diabetes (HCC) (Chronic)   BP Readings from Last 1 Encounters:  10/10/23 134/72   Well-controlled with amlodipine  5 mg QD and chlorthalidone  25 mg QD Advised to restart olmesartan  40 mg QD, discontinue chlorthalidone  for now Would prefer to continue ARB due to his history of DM Counseled for compliance with the medications Advised DASH diet and moderate exercise/walking, at least 150 mins/week      Relevant Orders   CBC with Differential/Platelet   CMP14+EGFR     Digestive   Clostridium difficile colitis - Primary   Had diarrhea and severe dehydration after recently taking ciprofloxacin/Flagyl  Was given oral vancomycin  during recent hospitalization Symptoms improved now Advised to take probiotic for 2 weeks Recheck CBC and CMP        Endocrine   Diabetes mellitus type 1 (HCC)   Followed by endocrinology.  A1c 8.1 in July.  Endocrinology follow-up as scheduled. Has insulin  pump Advised to continue olmesartan  for now      Relevant Orders   CMP14+EGFR     Other   Hospital discharge follow-up   Hospital chart reviewed, including discharge summary Medications reconciled and reviewed with the patient in detail      Relevant Orders   CBC with  Differential/Platelet   CMP14+EGFR    No orders of the defined types were placed in this encounter.   Follow-up: Return if symptoms worsen or fail to improve.    Suzzane MARLA Blanch, MD

## 2023-10-10 NOTE — Assessment & Plan Note (Signed)
 Hospital chart reviewed, including discharge summary Medications reconciled and reviewed with the patient in detail

## 2023-10-10 NOTE — Patient Instructions (Signed)
 Please start taking Olmesartan . Please stop taking Chlorthalidone .  Please continue to take medications as prescribed.  Please continue to follow low carb diet and perform moderate exercise/walking at least 150 mins/week.

## 2023-10-10 NOTE — Assessment & Plan Note (Signed)
 Followed by endocrinology.  A1c 8.1 in July.  Endocrinology follow-up as scheduled. Has insulin  pump Advised to continue olmesartan  for now

## 2023-10-10 NOTE — Assessment & Plan Note (Signed)
 BP Readings from Last 1 Encounters:  10/10/23 134/72   Well-controlled with amlodipine  5 mg QD and chlorthalidone  25 mg QD Advised to restart olmesartan  40 mg QD, discontinue chlorthalidone  for now Would prefer to continue ARB due to his history of DM Counseled for compliance with the medications Advised DASH diet and moderate exercise/walking, at least 150 mins/week

## 2023-10-11 ENCOUNTER — Other Ambulatory Visit: Payer: Self-pay | Admitting: Internal Medicine

## 2023-10-11 ENCOUNTER — Emergency Department (HOSPITAL_COMMUNITY)

## 2023-10-11 ENCOUNTER — Ambulatory Visit: Payer: Self-pay | Admitting: Internal Medicine

## 2023-10-11 ENCOUNTER — Other Ambulatory Visit: Payer: Self-pay

## 2023-10-11 ENCOUNTER — Observation Stay (HOSPITAL_COMMUNITY)
Admission: EM | Admit: 2023-10-11 | Discharge: 2023-10-12 | Disposition: A | Discharge status: Home / Self Care | Attending: Emergency Medicine | Admitting: Emergency Medicine

## 2023-10-11 DIAGNOSIS — A0472 Enterocolitis due to Clostridium difficile, not specified as recurrent: Secondary | ICD-10-CM

## 2023-10-11 DIAGNOSIS — Z794 Long term (current) use of insulin: Secondary | ICD-10-CM | POA: Diagnosis not present

## 2023-10-11 DIAGNOSIS — K219 Gastro-esophageal reflux disease without esophagitis: Secondary | ICD-10-CM

## 2023-10-11 DIAGNOSIS — E860 Dehydration: Secondary | ICD-10-CM | POA: Diagnosis not present

## 2023-10-11 DIAGNOSIS — R55 Syncope and collapse: Secondary | ICD-10-CM | POA: Diagnosis present

## 2023-10-11 DIAGNOSIS — N179 Acute kidney failure, unspecified: Secondary | ICD-10-CM | POA: Insufficient documentation

## 2023-10-11 DIAGNOSIS — K529 Noninfective gastroenteritis and colitis, unspecified: Secondary | ICD-10-CM | POA: Insufficient documentation

## 2023-10-11 DIAGNOSIS — Z79899 Other long term (current) drug therapy: Secondary | ICD-10-CM | POA: Diagnosis not present

## 2023-10-11 DIAGNOSIS — D72829 Elevated white blood cell count, unspecified: Secondary | ICD-10-CM | POA: Insufficient documentation

## 2023-10-11 DIAGNOSIS — E1065 Type 1 diabetes mellitus with hyperglycemia: Secondary | ICD-10-CM | POA: Diagnosis not present

## 2023-10-11 DIAGNOSIS — I10 Essential (primary) hypertension: Secondary | ICD-10-CM | POA: Insufficient documentation

## 2023-10-11 DIAGNOSIS — E871 Hypo-osmolality and hyponatremia: Secondary | ICD-10-CM

## 2023-10-11 DIAGNOSIS — E1165 Type 2 diabetes mellitus with hyperglycemia: Secondary | ICD-10-CM

## 2023-10-11 LAB — CBC WITH DIFFERENTIAL/PLATELET
Abs Immature Granulocytes: 0.15 K/uL — ABNORMAL HIGH (ref 0.00–0.07)
Basophils Absolute: 0.1 x10E3/uL (ref 0.0–0.2)
Basophils Absolute: 0.1 K/uL (ref 0.0–0.1)
Basophils Relative: 0 %
Basos: 1 %
EOS (ABSOLUTE): 0.2 x10E3/uL (ref 0.0–0.4)
Eos: 2 %
Eosinophils Absolute: 0.2 K/uL (ref 0.0–0.5)
Eosinophils Relative: 1 %
HCT: 37.5 % — ABNORMAL LOW (ref 39.0–52.0)
Hematocrit: 36.6 % — ABNORMAL LOW (ref 37.5–51.0)
Hemoglobin: 12.5 g/dL — ABNORMAL LOW (ref 13.0–17.7)
Hemoglobin: 12.6 g/dL — ABNORMAL LOW (ref 13.0–17.0)
Immature Grans (Abs): 0 x10E3/uL (ref 0.0–0.1)
Immature Granulocytes: 0 %
Immature Granulocytes: 1 %
Lymphocytes Absolute: 1.4 x10E3/uL (ref 0.7–3.1)
Lymphocytes Relative: 1 %
Lymphs Abs: 0.4 K/uL — ABNORMAL LOW (ref 0.7–4.0)
Lymphs: 15 %
MCH: 32 pg (ref 26.6–33.0)
MCH: 32.2 pg (ref 26.0–34.0)
MCHC: 34.2 g/dL (ref 31.5–35.7)
MCHC: 33.6 g/dL (ref 30.0–36.0)
MCV: 94 fL (ref 79–97)
MCV: 95.9 fL (ref 80.0–100.0)
Monocytes Absolute: 0.4 x10E3/uL (ref 0.1–0.9)
Monocytes Absolute: 1.4 K/uL — ABNORMAL HIGH (ref 0.1–1.0)
Monocytes Relative: 5 %
Monocytes: 4 %
Neutro Abs: 26.6 K/uL — ABNORMAL HIGH (ref 1.7–7.7)
Neutrophils Absolute: 7.2 x10E3/uL — ABNORMAL HIGH (ref 1.4–7.0)
Neutrophils Relative %: 92 %
Neutrophils: 78 %
Platelets: 457 x10E3/uL — ABNORMAL HIGH (ref 150–450)
Platelets: 388 K/uL (ref 150–400)
RBC: 3.91 x10E6/uL — ABNORMAL LOW (ref 4.14–5.80)
RBC: 3.91 MIL/uL — ABNORMAL LOW (ref 4.22–5.81)
RDW: 12.8 % (ref 11.6–15.4)
RDW: 12.5 % (ref 11.5–15.5)
Smear Review: NORMAL
WBC: 9.2 x10E3/uL (ref 3.4–10.8)
WBC: 28.8 K/uL — ABNORMAL HIGH (ref 4.0–10.5)
nRBC: 0 % (ref 0.0–0.2)

## 2023-10-11 LAB — URINALYSIS, ROUTINE W REFLEX MICROSCOPIC
Bacteria, UA: NONE SEEN
Bilirubin Urine: NEGATIVE
Glucose, UA: NEGATIVE mg/dL
Ketones, ur: NEGATIVE mg/dL
Leukocytes,Ua: NEGATIVE
Nitrite: NEGATIVE
Protein, ur: 100 mg/dL — AB
Specific Gravity, Urine: 1.018 (ref 1.005–1.030)
pH: 5 (ref 5.0–8.0)

## 2023-10-11 LAB — COMPREHENSIVE METABOLIC PANEL WITH GFR
ALT: 22 U/L (ref 0–44)
AST: 21 U/L (ref 15–41)
Albumin: 2.9 g/dL — ABNORMAL LOW (ref 3.5–5.0)
Alkaline Phosphatase: 90 U/L (ref 38–126)
Anion gap: 13 (ref 5–15)
BUN: 25 mg/dL — ABNORMAL HIGH (ref 6–20)
CO2: 16 mmol/L — ABNORMAL LOW (ref 22–32)
Calcium: 8.1 mg/dL — ABNORMAL LOW (ref 8.9–10.3)
Chloride: 100 mmol/L (ref 98–111)
Creatinine, Ser: 1.31 mg/dL — ABNORMAL HIGH (ref 0.61–1.24)
GFR, Estimated: >60 mL/min (ref >60)
Glucose, Bld: 106 mg/dL — ABNORMAL HIGH (ref 70–99)
Potassium: 4 mmol/L (ref 3.5–5.1)
Sodium: 129 mmol/L — ABNORMAL LOW (ref 135–145)
Total Bilirubin: 0.6 mg/dL (ref 0.0–1.2)
Total Protein: 5.9 g/dL — ABNORMAL LOW (ref 6.5–8.1)

## 2023-10-11 LAB — CMP14+EGFR
ALT: 25 IU/L (ref 0–44)
AST: 22 IU/L (ref 0–40)
Albumin: 3.7 g/dL — ABNORMAL LOW (ref 3.8–4.9)
Alkaline Phosphatase: 135 IU/L — ABNORMAL HIGH (ref 47–123)
BUN/Creatinine Ratio: 19 (ref 9–20)
BUN: 21 mg/dL (ref 6–24)
Bilirubin Total: 0.2 mg/dL (ref 0.0–1.2)
CO2: 21 mmol/L (ref 20–29)
Calcium: 9.1 mg/dL (ref 8.7–10.2)
Chloride: 88 mmol/L — ABNORMAL LOW (ref 96–106)
Creatinine, Ser: 1.13 mg/dL (ref 0.76–1.27)
Globulin, Total: 2.9 g/dL (ref 1.5–4.5)
Glucose: 129 mg/dL — ABNORMAL HIGH (ref 70–99)
Potassium: 4.4 mmol/L (ref 3.5–5.2)
Sodium: 123 mmol/L — ABNORMAL LOW (ref 134–144)
Total Protein: 6.6 g/dL (ref 6.0–8.5)
eGFR: 76 mL/min/1.73 (ref >59)

## 2023-10-11 LAB — BLOOD GAS, VENOUS
Acid-base deficit: 5.1 mmol/L — ABNORMAL HIGH (ref 0.0–2.0)
Bicarbonate: 19.9 mmol/L — ABNORMAL LOW (ref 20.0–28.0)
Drawn by: 4237
O2 Saturation: 72.8 %
Patient temperature: 37.4
pCO2, Ven: 37 mmHg — ABNORMAL LOW (ref 44–60)
pH, Ven: 7.34 (ref 7.25–7.43)
pO2, Ven: 43 mmHg (ref 32–45)

## 2023-10-11 LAB — CBG MONITORING, ED
Glucose-Capillary: 120 mg/dL — ABNORMAL HIGH (ref 70–99)
Glucose-Capillary: 123 mg/dL — ABNORMAL HIGH (ref 70–99)
Glucose-Capillary: 489 mg/dL — ABNORMAL HIGH (ref 70–99)

## 2023-10-11 LAB — LACTIC ACID, PLASMA
Lactic Acid, Venous: 0.8 mmol/L (ref 0.5–1.9)
Lactic Acid, Venous: 0.7 mmol/L (ref 0.5–1.9)

## 2023-10-11 LAB — C DIFFICILE QUICK SCREEN W PCR REFLEX
C Diff antigen: NEGATIVE
C Diff interpretation: NOT DETECTED
C Diff toxin: NEGATIVE

## 2023-10-11 MED ORDER — IOHEXOL 300 MG/ML  SOLN
100.0000 mL | Freq: Once | INTRAMUSCULAR | Status: AC | PRN
  Administered 2023-10-11: 100 mL via INTRAVENOUS

## 2023-10-11 MED ORDER — SODIUM CHLORIDE 0.9 % IV BOLUS
1000.0000 mL | Freq: Once | INTRAVENOUS | Status: AC
  Administered 2023-10-11: 1000 mL via INTRAVENOUS

## 2023-10-11 MED ORDER — TRIPLE ANTIBIOTIC 3.5-400-5000 EX OINT
1.0000 | TOPICAL_OINTMENT | Freq: Once | CUTANEOUS | Status: AC
  Administered 2023-10-11: 1 via CUTANEOUS
  Filled 2023-10-11: qty 1

## 2023-10-11 MED ORDER — INSULIN ASPART PROT & ASPART (70-30 MIX) 100 UNIT/ML ~~LOC~~ SUSP
10.0000 [IU] | Freq: Once | SUBCUTANEOUS | Status: AC
  Administered 2023-10-11: 10 [IU] via SUBCUTANEOUS
  Filled 2023-10-11: qty 10

## 2023-10-11 MED ORDER — ACETAMINOPHEN 500 MG PO TABS
1000.0000 mg | ORAL_TABLET | Freq: Once | ORAL | Status: AC
  Administered 2023-10-11: 1000 mg via ORAL
  Filled 2023-10-11: qty 2

## 2023-10-11 MED ORDER — ONDANSETRON HCL 4 MG/2ML IJ SOLN
4.0000 mg | Freq: Once | INTRAMUSCULAR | Status: AC
  Administered 2023-10-11: 4 mg via INTRAVENOUS
  Filled 2023-10-11: qty 2

## 2023-10-11 MED ORDER — MORPHINE SULFATE (PF) 4 MG/ML IV SOLN
4.0000 mg | Freq: Once | INTRAVENOUS | Status: DC
  Filled 2023-10-11: qty 1

## 2023-10-11 MED ORDER — SODIUM CHLORIDE 0.9 % IV BOLUS: 1000.0000 mL | Freq: Once | INTRAVENOUS | Status: DC

## 2023-10-11 MED ORDER — SODIUM CHLORIDE 0.9 % IV BOLUS
1000.0000 mL | Freq: Once | INTRAVENOUS | Status: DC
Start: 2023-10-11 — End: 2023-10-12

## 2023-10-11 NOTE — ED Provider Notes (Signed)
 [place] EMERGENCY DEPARTMENT AT [facility] Provider Note   CSN: [id number] Arrival date & time: 10/11/23  1437     Patient presents with: Fall, Nausea, and Emesis   [name] is a 58 y.o. male.   Pt is a 58 yo male with pmhx significant for DM1, HTN, anemia, GERD, HLD and recent c diff infection.  Pt developed c diff while on a vacation in [place] which required a 4 day admission there.  He came back here and diarrhea worsened and he was hospitalized with c diff from 910-13 for which he was treated with vancomycin .  While in the hospital, his olmsartan was held due to low blood pressures (amlodipine  and chlorthalidone  continued).  He saw his doctor yesterday and his olmesartan  was restarted.  Pt was to d/c the chlorthalidone .  Today, pt started taking his olmesartan  again.  He had a syncopal event twice today.  He's had some n/v/d.  He did hit his head the 2nd time he fell.  EMS was called and said his initial bp was in the 70s.  He was given 1L NS en route.  Pt has not eaten anything today.  Wife said his c diff test at the doctor's office yesterday was negative, however, I can't see that result.  Pt said his doctor asked him to get another test, but he has not done it yet.        Prior to Admission medications   Medication Sig Start Date End Date Taking? Authorizing Provider  amLODipine  (NORVASC ) 5 MG tablet TAKE 1 TABLET(5 MG) BY MOUTH DAILY Patient taking differently: Take 5 mg by mouth daily. TAKE 1 TABLET(5 MG) BY MOUTH DAILY 12/07/22   Melvenia Manus BRAVO, MD  Continuous Glucose Sensor (DEXCOM G7 SENSOR) MISC Use to monitor blood sugar continuously. Change sensor every 10 days as directed. 01/29/23   Therisa Benton PARAS, NP  Continuous Glucose Sensor (DEXCOM G7 SENSOR) MISC Use as directed and change every 10 days. 10/01/23   Therisa Benton PARAS, NP  esomeprazole  (NEXIUM ) 40 MG capsule Take 1 capsule (40 mg total) by mouth 2 (two) times daily before a meal. 09/11/23    Bevely Doffing, FNP  Evolocumab  (REPATHA ) 140 MG/ML SOSY Inject 140 mg into the skin every 14 (fourteen) days. Patient not taking: Reported on 09/27/2023 07/17/23   Bevely Doffing, FNP  Evolocumab  (REPATHA ) 140 MG/ML SOSY Inject 140 mg into the skin every 14 (fourteen) days. Patient not taking: Reported on 09/27/2023 08/27/23   Bevely Doffing, FNP  Insulin  Disposable Pump (OMNIPOD 5 DEXG7G6 PODS GEN 5) MISC CHANGE POD EVERY 48 TO 72 HOURS 03/21/23   Reardon, Benton PARAS, NP  Insulin  Disposable Pump (OMNIPOD 5 DEXG7G6 PODS GEN 5) MISC Change pod every 48 to 72 hours 05/30/23   Therisa Benton PARAS, NP  Insulin  Disposable Pump (OMNIPOD 5 G6 INTRO, GEN 5,) KIT Change pod every 48-72 hrs 06/07/21   Therisa Benton PARAS, NP  insulin  lispro (HUMALOG ) 100 UNIT/ML injection Use with Omnipod for total daily dose of 50 units per day. 04/16/23   Therisa Benton PARAS, NP  olmesartan  (BENICAR ) 40 MG tablet Take 1 tablet (40 mg total) by mouth daily. Patient not taking: Reported on 09/21/2023 08/27/23   Bevely Doffing, FNP  ondansetron  (ZOFRAN ) 4 MG tablet Take 1 tablet (4 mg total) by mouth every 8 (eight) hours as needed for nausea or vomiting. 09/21/23   Boswell, Chelsa, NP  triamcinolone  cream (KENALOG ) 0.1 % Apply topically 2 (two) times  daily. Patient not taking: Reported on 09/27/2023 07/17/23   Bevely Doffing, FNP    Allergies: Crestor  [rosuvastatin  calcium ], Iron, Lipitor [atorvastatin  calcium ], and Statins    Review of Systems  Gastrointestinal:  Positive for diarrhea, nausea and vomiting.  Neurological:  Positive for headaches.  All other systems reviewed and are negative.   Updated Vital Signs BP (!) 112/59   Pulse 99   Temp 98.7 F (37.1 C)   Resp 19   Ht 5' 10 (1.778 m)   Wt 77.6 kg   SpO2 93%   BMI 24.54 kg/m   Physical Exam Vitals and nursing note reviewed.  Constitutional:      Appearance: Normal appearance.  HENT:     Head: Normocephalic.     Comments: Skin tears to left side of face    Nose:  Nose normal.     Mouth/Throat:     Mouth: Mucous membranes are moist.     Pharynx: Oropharynx is clear.  Eyes:     Extraocular Movements: Extraocular movements intact.     Conjunctiva/sclera: Conjunctivae normal.     Pupils: Pupils are equal, round, and reactive to light.  Cardiovascular:     Rate and Rhythm: Normal rate and regular rhythm.     Pulses: Normal pulses.     Heart sounds: Normal heart sounds.  Pulmonary:     Effort: Pulmonary effort is normal.     Breath sounds: Normal breath sounds.  Abdominal:     General: Abdomen is flat. Bowel sounds are normal.     Palpations: Abdomen is soft.  Musculoskeletal:        General: Normal range of motion.     Cervical back: Normal range of motion and neck supple.  Skin:    General: Skin is warm.     Capillary Refill: Capillary refill takes less than 2 seconds.  Neurological:     General: No focal deficit present.     Mental Status: He is alert and oriented to person, place, and time.  Psychiatric:        Mood and Affect: Mood normal.        Behavior: Behavior normal.     (all labs ordered are listed, but only abnormal results are displayed) Labs Reviewed  CBC WITH DIFFERENTIAL/PLATELET - Abnormal; Notable for the following components:      Result Value   WBC 28.8 (*)    RBC 3.91 (*)    Hemoglobin 12.6 (*)    HCT 37.5 (*)    Neutro Abs 26.6 (*)    Lymphs Abs 0.4 (*)    Monocytes Absolute 1.4 (*)    Abs Immature Granulocytes 0.15 (*)    All other components within normal limits  COMPREHENSIVE METABOLIC PANEL WITH GFR - Abnormal; Notable for the following components:   Sodium 129 (*)    CO2 16 (*)    Glucose, Bld 106 (*)    BUN 25 (*)    Creatinine, Ser 1.31 (*)    Calcium  8.1 (*)    Total Protein 5.9 (*)    Albumin 2.9 (*)    All other components within normal limits  URINALYSIS, ROUTINE W REFLEX MICROSCOPIC - Abnormal; Notable for the following components:   Color, Urine STRAW (*)    Hgb urine dipstick SMALL (*)     Protein, ur 100 (*)    All other components within normal limits  BLOOD GAS, VENOUS - Abnormal; Notable for the following components:   pCO2, Ven 37 (*)  Bicarbonate 19.9 (*)    Acid-base deficit 5.1 (*)    All other components within normal limits  CBG MONITORING, ED - Abnormal; Notable for the following components:   Glucose-Capillary 120 (*)    All other components within normal limits  CBG MONITORING, ED - Abnormal; Notable for the following components:   Glucose-Capillary 123 (*)    All other components within normal limits  CBG MONITORING, ED - Abnormal; Notable for the following components:   Glucose-Capillary 489 (*)    All other components within normal limits  C DIFFICILE QUICK SCREEN W PCR REFLEX    CULTURE, BLOOD (ROUTINE X 2)  CULTURE, BLOOD (ROUTINE X 2)  LACTIC ACID, PLASMA  LACTIC ACID, PLASMA    EKG: EKG Interpretation Date/Time:  Thursday October 11 2023 15:23:30 EDT Ventricular Rate:  82 PR Interval:  165 QRS Duration:  95 QT Interval:  366 QTC Calculation: 428 R Axis:   81  Text Interpretation: Sinus rhythm Consider left atrial enlargement Since last tracing rate slower Confirmed by Dean Clarity [phone]) on 10/11/2023 3:46:54 PM  Radiology: CT CERVICAL SPINE WO CONTRAST Result Date: 10/11/2023 EXAM: CT CERVICAL SPINE WITHOUT CONTRAST 10/11/2023 05:30:34 PM TECHNIQUE: CT of the cervical spine was performed without the administration of intravenous contrast. Multiplanar reformatted images are provided for review. Automated exposure control, iterative reconstruction, and/or weight based adjustment of the mA/kV was utilized to reduce the radiation dose to as low as reasonably achievable. COMPARISON: None available. CLINICAL HISTORY: Polytrauma, blunt. c/o n/v/d, hypotension, and a fall today. Pt has an abrasion to the top of his head. Pt c/o SOB, and chills. FINDINGS: CERVICAL SPINE: BONES AND ALIGNMENT: No acute fracture or traumatic malalignment.  DEGENERATIVE CHANGES: Mild degenerative changes, most prominent at C5-6. SOFT TISSUES: No prevertebral soft tissue swelling. IMPRESSION: 1. No acute abnormality of the cervical spine. Electronically signed by: Pinkie Pebbles MD 10/11/2023 06:20 PM EDT RP Workstation: [id number]   CT HEAD WO CONTRAST Result Date: 10/11/2023 EXAM: CT HEAD WITHOUT CONTRAST 10/11/2023 05:30:34 PM TECHNIQUE: CT of the head was performed without the administration of intravenous contrast. Automated exposure control, iterative reconstruction, and/or weight based adjustment of the mA/kV was utilized to reduce the radiation dose to as low as reasonably achievable. COMPARISON: 06/02/2021 CLINICAL HISTORY: Polytrauma, blunt. c/o n/v/d, hypotension, and a fall today. Pt has an abrasion to the top of his head. Pt c/o SOB, and chills. FINDINGS: BRAIN AND VENTRICLES: Mild subcortical and periventricular small vessel ischemic changes. Mild intracranial atherosclerosis. No acute hemorrhage. No evidence of acute infarct. No hydrocephalus. No extra-axial collection. No mass effect or midline shift. ORBITS: No acute abnormality. SINUSES: No acute abnormality. SOFT TISSUES AND SKULL: Abrasion to the top of the head. No skull fracture. IMPRESSION: 1. No acute intracranial abnormality. Electronically signed by: Pinkie Pebbles MD 10/11/2023 06:20 PM EDT RP Workstation: [id number]   CT ABDOMEN PELVIS W CONTRAST Result Date: 10/11/2023 EXAM: CT ABDOMEN AND PELVIS WITH CONTRAST 10/11/2023 05:30:34 PM TECHNIQUE: CT of the abdomen and pelvis was performed with the administration of 100 mL iohexol  (OMNIPAQUE ) 300 MG/ML solution. Multiplanar reformatted images are provided for review. Automated exposure control, iterative reconstruction, and/or weight-based adjustment of the mA/kV was utilized to reduce the radiation dose to as low as reasonably achievable. COMPARISON: 09/26/2023 CLINICAL HISTORY: Abdominal pain, acute, nonlocalized. c/o n/v/d,  hypotension, and a fall today. Pt has an abrasion to the top of his head. Pt c/o SOB, and chills. FINDINGS: LOWER CHEST: No acute abnormality. LIVER: The liver is  unremarkable. GALLBLADDER AND BILE DUCTS: Gallbladder is unremarkable. No biliary ductal dilatation. SPLEEN: No acute abnormality. PANCREAS: No acute abnormality. ADRENAL GLANDS: No acute abnormality. KIDNEYS, URETERS AND BLADDER: No stones in the kidneys or ureters. No hydronephrosis. Mild nonspecific perinephric stranding bilaterally, unchanged, without heterogeneous enhancement to suggest pyelonephritis. Urinary bladder is mildly thick walled, correlate for cystitis. GI AND BOWEL: Stomach demonstrates no acute abnormality. There is no bowel obstruction. PERITONEUM AND RETROPERITONEUM: No ascites. No free air. VASCULATURE: Atherosclerotic calcifications of the abdominal aorta and branch vessels, although patent. LYMPH NODES: No lymphadenopathy. REPRODUCTIVE ORGANS: Prostate is unremarkable. BONES AND SOFT TISSUES: No acute osseous abnormality. No focal soft tissue abnormality. IMPRESSION: 1. Mild bladder wall thickening, correlate for cystitis. 2. Otherwise, no acute findings. Electronically signed by: Pinkie Pebbles MD 10/11/2023 05:59 PM EDT RP Workstation: [id number]   DG Chest Port 1 View Result Date: 10/11/2023 CLINICAL DATA:  Syncope. EXAM: PORTABLE CHEST 1 VIEW COMPARISON:  09/26/2023 FINDINGS: Heart size and pulmonary vascularity are normal. Lungs are clear. No pleural effusion or pneumothorax. Mediastinal contours appear intact. Degenerative changes in the spine and shoulders. IMPRESSION: No active disease. Electronically Signed   By: Elsie Gravely M.D.   On: 10/11/2023 16:00     Procedures   Medications Ordered in the ED  sodium chloride  0.9 % bolus 1,000 mL (has no administration in time range)  sodium chloride  0.9 % bolus 1,000 mL (has no administration in time range)  sodium chloride  0.9 % bolus 1,000 mL (0 mLs Intravenous  Stopped 10/11/23 1558)  ondansetron  (ZOFRAN ) injection 4 mg (4 mg Intravenous Given 10/11/23 1524)  iohexol  (OMNIPAQUE ) 300 MG/ML solution 100 mL (100 mLs Intravenous Contrast Given 10/11/23 1711)  sodium chloride  0.9 % bolus 1,000 mL (0 mLs Intravenous Stopped 10/11/23 2156)  acetaminophen  (TYLENOL ) tablet 1,000 mg (1,000 mg Oral Given 10/11/23 1917)  neomycin -bacitracin -polymyxin [id number] OINT 1 Application (1 Application Apply externally Given 10/11/23 1919)  insulin  aspart protamine- aspart (NOVOLOG  MIX 70/30) injection 10 Units (10 Units Subcutaneous Given 10/11/23 2234)                                    Medical Decision Making Amount and/or Complexity of Data Reviewed Labs: ordered. Radiology: ordered.  Risk OTC drugs. Prescription drug management. Decision regarding hospitalization.   This patient presents to the ED for concern of syncope, this involves an extensive number of treatment options, and is a complaint that carries with it a high risk of complications and morbidity.  The differential diagnosis includes dehydration, bp med rxn, electrolyte abn   Co morbidities that complicate the patient evaluation  DM1, HTN, anemia, GERD, HLD and recent c diff infection.   Additional history obtained:  Additional history obtained from epic chart review External records from outside source obtained and reviewed including EMS report/wife   Lab Tests:  I Ordered, and personally interpreted labs.  The pertinent results include:  cbc with wbc elevated at 28.8, hgb 12.6 (wbc 9.2 yest, hgb stable); cmp with na low at 129 (stable), CO2 low at 16, bun elevated at 25 and cr 1.31 (bun 21 and cr 1.13 yest)   Imaging Studies ordered:  I ordered imaging studies including cxr, ct head/c-spine  I independently visualized and interpreted imaging which showed  CXR: No active disease.  CT head: No acute intracranial abnormality.  CT cervical spine: No acute abnormality of the cervical  spine.  I agree with the  radiologist interpretation   Cardiac Monitoring:  The patient was maintained on a cardiac monitor.  I personally viewed and interpreted the cardiac monitored which showed an underlying rhythm of: nsr   Medicines ordered and prescription drug management:  I ordered medication including ivfs/zofran   for sx  Reevaluation of the patient after these medicines showed that the patient improved I have reviewed the patients home medicines and have made adjustments as needed   Test Considered:  ct   Critical Interventions:  ivfs   Consultations Obtained:  I requested consultation with the hospitalist (Dr. Keturah),  and discussed lab and imaging findings as well as pertinent plan - he will admit   Problem List / ED Course:  N/v/d and dehydration:  exacerbated by restarted olmsartan.  Pt given ivfs and hr and bp has improved. AKI; ivfs given Elevated wbc:  possibly due to continued c diff infection.  Lactic nl, so I did not want to give him multiple iv abx to possibly exacerbate c diff.   Elevated BS:  CT had to take off pt's omnipod.  Pt given 10 units of novolog .  Pt did not have any refills.   Reevaluation:  After the interventions noted above, I reevaluated the patient and found that they have :improved   Social Determinants of Health:  Lives at home   Dispostion:  After consideration of the diagnostic results and the patients response to treatment, I feel that the patent would benefit from admission.    CRITICAL CARE Performed by: Mliss Boyers   Total critical care time: 30 minutes  Critical care time was exclusive of separately billable procedures and treating other patients.  Critical care was necessary to treat or prevent imminent or life-threatening deterioration.  Critical care was time spent personally by me on the following activities: development of treatment plan with patient and/or surrogate as well as nursing, discussions  with consultants, evaluation of patient's response to treatment, examination of patient, obtaining history from patient or surrogate, ordering and performing treatments and interventions, ordering and review of laboratory studies, ordering and review of radiographic studies, pulse oximetry and re-evaluation of patient's condition.      Final diagnoses:  Dehydration  AKI (acute kidney injury)  Hyperglycemia due to diabetes mellitus (HCC)  Leukocytosis, unspecified type    ED Discharge Orders     None          Boyers Mliss, MD 10/11/23 2313

## 2023-10-11 NOTE — ED Triage Notes (Signed)
 Pt arrived [facility] from home with c/o n/v/d, hypotension, and a fall today. Pt has an abrasion to the top of his head. Pt c/o SOB, and chills. Wife stated pt was in [place] last month.

## 2023-10-11 NOTE — ED Notes (Signed)
 Md asked if pt could be ambulated. He was able to walk down the hallway said he didn't feel dizzy just weak heart rate held around 119.

## 2023-10-11 NOTE — ED Notes (Signed)
 EDP at bedside during triage

## 2023-10-12 ENCOUNTER — Encounter (HOSPITAL_COMMUNITY): Payer: Self-pay | Admitting: Internal Medicine

## 2023-10-12 DIAGNOSIS — E860 Dehydration: Principal | ICD-10-CM

## 2023-10-12 DIAGNOSIS — E1065 Type 1 diabetes mellitus with hyperglycemia: Secondary | ICD-10-CM | POA: Diagnosis not present

## 2023-10-12 DIAGNOSIS — R55 Syncope and collapse: Secondary | ICD-10-CM | POA: Diagnosis not present

## 2023-10-12 DIAGNOSIS — K529 Noninfective gastroenteritis and colitis, unspecified: Secondary | ICD-10-CM

## 2023-10-12 LAB — PHOSPHORUS
Phosphorus: 3 mg/dL (ref 2.5–4.6)
Phosphorus: 3.1 mg/dL (ref 2.5–4.6)

## 2023-10-12 LAB — BASIC METABOLIC PANEL WITH GFR
Anion gap: 14 (ref 5–15)
Anion gap: 7 (ref 5–15)
Anion gap: 10 (ref 5–15)
BUN: 24 mg/dL — ABNORMAL HIGH (ref 6–20)
BUN: 23 mg/dL — ABNORMAL HIGH (ref 6–20)
BUN: 24 mg/dL — ABNORMAL HIGH (ref 6–20)
CO2: 18 mmol/L — ABNORMAL LOW (ref 22–32)
CO2: 20 mmol/L — ABNORMAL LOW (ref 22–32)
CO2: 21 mmol/L — ABNORMAL LOW (ref 22–32)
Calcium: 8 mg/dL — ABNORMAL LOW (ref 8.9–10.3)
Calcium: 7.6 mg/dL — ABNORMAL LOW (ref 8.9–10.3)
Calcium: 7.7 mg/dL — ABNORMAL LOW (ref 8.9–10.3)
Chloride: 98 mmol/L (ref 98–111)
Chloride: 97 mmol/L — ABNORMAL LOW (ref 98–111)
Chloride: 96 mmol/L — ABNORMAL LOW (ref 98–111)
Creatinine, Ser: 1.19 mg/dL (ref 0.61–1.24)
Creatinine, Ser: 1.37 mg/dL — ABNORMAL HIGH (ref 0.61–1.24)
Creatinine, Ser: 1.27 mg/dL — ABNORMAL HIGH (ref 0.61–1.24)
GFR, Estimated: >60 mL/min (ref >60)
GFR, Estimated: >60 mL/min (ref >60)
GFR, Estimated: >60 mL/min
Glucose, Bld: 100 mg/dL — ABNORMAL HIGH (ref 70–99)
Glucose, Bld: 453 mg/dL — ABNORMAL HIGH (ref 70–99)
Glucose, Bld: 231 mg/dL — ABNORMAL HIGH (ref 70–99)
Potassium: 3.9 mmol/L (ref 3.5–5.1)
Potassium: 4.3 mmol/L (ref 3.5–5.1)
Potassium: 4.4 mmol/L (ref 3.5–5.1)
Sodium: 130 mmol/L — ABNORMAL LOW (ref 135–145)
Sodium: 124 mmol/L — ABNORMAL LOW (ref 135–145)
Sodium: 127 mmol/L — ABNORMAL LOW (ref 135–145)

## 2023-10-12 LAB — CBG MONITORING, ED
Glucose-Capillary: 467 mg/dL — ABNORMAL HIGH (ref 70–99)
Glucose-Capillary: 467 mg/dL — ABNORMAL HIGH (ref 70–99)

## 2023-10-12 LAB — CBC
HCT: 31.2 % — ABNORMAL LOW (ref 39.0–52.0)
Hemoglobin: 10.8 g/dL — ABNORMAL LOW (ref 13.0–17.0)
MCH: 31.8 pg (ref 26.0–34.0)
MCHC: 34.6 g/dL (ref 30.0–36.0)
MCV: 91.8 fL (ref 80.0–100.0)
Platelets: 330 K/uL (ref 150–400)
RBC: 3.4 MIL/uL — ABNORMAL LOW (ref 4.22–5.81)
RDW: 12.6 % (ref 11.5–15.5)
WBC: 13.9 K/uL — ABNORMAL HIGH (ref 4.0–10.5)
nRBC: 0 % (ref 0.0–0.2)

## 2023-10-12 LAB — GASTROINTESTINAL PANEL BY PCR, STOOL (REPLACES STOOL CULTURE)

## 2023-10-12 LAB — PROCALCITONIN: Procalcitonin: 0.66 ng/mL

## 2023-10-12 LAB — GLUCOSE, CAPILLARY
Glucose-Capillary: 459 mg/dL — ABNORMAL HIGH (ref 70–99)
Glucose-Capillary: 395 mg/dL — ABNORMAL HIGH (ref 70–99)
Glucose-Capillary: 242 mg/dL — ABNORMAL HIGH (ref 70–99)

## 2023-10-12 LAB — RESP PANEL BY RT-PCR (RSV, FLU A&B, COVID)  RVPGX2
Influenza A by PCR: NEGATIVE
Influenza B by PCR: NEGATIVE
Resp Syncytial Virus by PCR: NEGATIVE
SARS Coronavirus 2 by RT PCR: NEGATIVE

## 2023-10-12 LAB — MAGNESIUM
Magnesium: 1.4 mg/dL — ABNORMAL LOW (ref 1.7–2.4)
Magnesium: 1.9 mg/dL (ref 1.7–2.4)

## 2023-10-12 LAB — HIV ANTIBODY (ROUTINE TESTING W REFLEX): HIV Screen 4th Generation wRfx: REACTIVE — AB

## 2023-10-12 LAB — TSH: TSH: 1.804 u[IU]/mL (ref 0.350–4.500)

## 2023-10-12 MED ORDER — MAGNESIUM SULFATE 4 GM/100ML IV SOLN
4.0000 g | Freq: Once | INTRAVENOUS | Status: AC
  Administered 2023-10-12: 4 g via INTRAVENOUS
  Filled 2023-10-12: qty 100

## 2023-10-12 MED ORDER — INSULIN GLARGINE 100 UNIT/ML ~~LOC~~ SOLN
12.0000 [IU] | Freq: Every day | SUBCUTANEOUS | Status: DC
  Administered 2023-10-12: 12 [IU] via SUBCUTANEOUS
  Filled 2023-10-12 (×2): qty 0.12

## 2023-10-12 MED ORDER — INSULIN ASPART 100 UNIT/ML IJ SOLN
0.0000 [IU] | Freq: Three times a day (TID) | INTRAMUSCULAR | Status: DC
  Administered 2023-10-12: 5 [IU] via SUBCUTANEOUS

## 2023-10-12 MED ORDER — INSULIN ASPART 100 UNIT/ML IJ SOLN
6.0000 [IU] | Freq: Once | INTRAMUSCULAR | Status: AC
  Administered 2023-10-12: 6 [IU] via SUBCUTANEOUS

## 2023-10-12 MED ORDER — ENOXAPARIN SODIUM 40 MG/0.4ML IJ SOSY
40.0000 mg | PREFILLED_SYRINGE | INTRAMUSCULAR | Status: DC
  Filled 2023-10-12: qty 0.4

## 2023-10-12 MED ORDER — PANCRELIPASE (LIP-PROT-AMYL) 12000-38000 UNITS PO CPEP: 12000.0000 [IU] | ORAL_CAPSULE | Freq: Three times a day (TID) | ORAL | 0 refills | Status: DC

## 2023-10-12 MED ORDER — INSULIN ASPART 100 UNIT/ML IJ SOLN: 0.0000 [IU] | INTRAMUSCULAR | Status: AC

## 2023-10-12 MED ORDER — SODIUM CHLORIDE 0.9% FLUSH: 3.0000 mL | Freq: Two times a day (BID) | INTRAVENOUS | Status: DC

## 2023-10-12 MED ORDER — INSULIN ASPART 100 UNIT/ML IJ SOLN: 0.0000 [IU] | Freq: Three times a day (TID) | INTRAMUSCULAR | Status: DC

## 2023-10-12 MED ORDER — BISMUTH SUBSALICYLATE 262 MG/15ML PO SUSP: 30.0000 mL | ORAL | Status: DC | PRN

## 2023-10-12 MED ORDER — LACTATED RINGERS IV SOLN: INTRAVENOUS | Status: DC

## 2023-10-12 MED ORDER — INSULIN ASPART 100 UNIT/ML IJ SOLN
11.0000 [IU] | Freq: Once | INTRAMUSCULAR | Status: AC
Start: 2023-10-12 — End: 2023-10-12
  Administered 2023-10-12: 11 [IU] via SUBCUTANEOUS

## 2023-10-12 MED ORDER — FAMOTIDINE 20 MG PO TABS: 20.0000 mg | ORAL_TABLET | Freq: Two times a day (BID) | ORAL | Status: DC | PRN

## 2023-10-12 MED ORDER — INSULIN ASPART 100 UNIT/ML IJ SOLN: 0.0000 [IU] | Freq: Every day | INTRAMUSCULAR | Status: DC

## 2023-10-12 NOTE — Plan of Care (Signed)

## 2023-10-12 NOTE — Evaluation (Signed)
 Physical Therapy Evaluation Patient Details Name: [name] MRN: [id number] DOB: 01-25-1965 Today's Date: 10/12/2023  History of Present Illness  [name] is a 58 y.o. male with hx of DM1, HTN, HLD, recent admission 9/10 - 13 with C diff colitis and dehydration, syncope, who returns with recurrent syncopal episode and ongoing N/V/D.  Reports his symptoms of diarrhea had improved since his recent hospitalization although today returned with having 4 watery bowel movements.  Also had 2 episodes of emesis which is clear.  Notes that he was out helping putting a rack on the car when he started to feel generally weak became nauseous, went to the bathroom but passed out before reaching it and fell and hit his head.  Then while sitting on the commode had a recurrent syncopal episode.  Chills today otherwise no fevers.  No cough/cold, associated abdominal pain, urinary changes, rashes /other localizing infectious symptoms.  Reports that his symptoms initially started about 4 weeks ago when he was on a cruise in [place], he denies any drinking of fresh water/well water or abnormal exposures, was drinking bottled water only.   Clinical Impression  Pt was agreeable to completing today's PT evaluation. He was independent with all bed mobility and transfers. However, he exhibited reduced gait speed and poor right foot clearance secondary to R foot drop. He also has a history of multiple falls secondary to his poor R foot clearance. He was left in the bed with the call bell within reach and family present. Patient will benefit from continued skilled physical therapy in hospital and recommended venue below to increase strength, balance, endurance for safe ADLs and gait.         If plan is discharge home, recommend the following:     Can travel by private vehicle        Equipment Recommendations None recommended by PT  Recommendations for Other Services       Functional Status Assessment Patient has  not had a recent decline in their functional status     Precautions / Restrictions Precautions Precautions: Fall Recall of Precautions/Restrictions: Intact Restrictions Weight Bearing Restrictions Per Provider Order: No      Mobility  Bed Mobility Overal bed mobility: Independent                  Transfers Overall transfer level: Independent Equipment used: None                    Ambulation/Gait Ambulation/Gait assistance: Modified independent (Device/Increase time) Gait Distance (Feet): 300 Feet Assistive device: None Gait Pattern/deviations: Step-through pattern, Decreased stride length, Decreased dorsiflexion - right Gait velocity: decreased     General Gait Details: R foot drop at baseline  Stairs            Wheelchair Mobility     Tilt Bed    Modified Rankin (Stroke Patients Only)       Balance Overall balance assessment: Mild deficits observed, not formally tested                                           Pertinent Vitals/Pain Pain Assessment Pain Assessment: No/denies pain    Home Living Family/patient expects to be discharged to:: Private residence Living Arrangements: Spouse/significant other Available Help at Discharge: Family;Available 24 hours/day Type of Home: House Home Access: Stairs to enter Entrance Stairs-Rails: None Entrance  Stairs-Number of Steps: 4   Home Layout: One level Home Equipment: Cane - single point      Prior Function Prior Level of Function : Independent/Modified Independent;Working/employed;Driving             Mobility Comments: Independent ADLs Comments: Independent     Extremity/Trunk Assessment   Upper Extremity Assessment Upper Extremity Assessment: Overall WFL for tasks assessed    Lower Extremity Assessment Lower Extremity Assessment: Overall WFL for tasks assessed    Cervical / Trunk Assessment Cervical / Trunk Assessment: Normal  Communication    Communication Communication: No apparent difficulties    Cognition Arousal: Alert Behavior During Therapy: WFL for tasks assessed/performed   PT - Cognitive impairments: No apparent impairments                         Following commands: Intact       Cueing Cueing Techniques: Verbal cues     General Comments      Exercises     Assessment/Plan    PT Assessment Patient needs continued PT services  PT Problem List Decreased balance;Decreased strength;Decreased mobility       PT Treatment Interventions Gait training;Stair training;Functional mobility training;Therapeutic activities;Therapeutic exercise;Balance training;Patient/family education    PT Goals (Current goals can be found in the Care Plan section)  Acute Rehab PT Goals Patient Stated Goal: to return home and get outpatient PT to help with his balance PT Goal Formulation: With patient/family Time For Goal Achievement: 10/19/23 Potential to Achieve Goals: Good    Frequency Min 2X/week     Co-evaluation               AM-PAC PT 6 Clicks Mobility  Outcome Measure Help needed turning from your back to your side while in a flat bed without using bedrails?: None Help needed moving from lying on your back to sitting on the side of a flat bed without using bedrails?: None Help needed moving to and from a bed to a chair (including a wheelchair)?: None Help needed standing up from a chair using your arms (e.g., wheelchair or bedside chair)?: None Help needed to walk in hospital room?: None Help needed climbing 3-5 steps with a railing? : A Little 6 Click Score: 23    End of Session   Activity Tolerance: Patient tolerated treatment well Patient left: in bed;with call bell/phone within reach;with family/visitor present Nurse Communication: Mobility status      Time: 9171-9159 PT Time Calculation (min) (ACUTE ONLY): 12 min   Charges:   PT Evaluation $PT Eval Low Complexity: 1 Low   PT  General Charges $$ ACUTE PT VISIT: 1 Visit        Lacinda Fass, PT, DPT  10/12/2023, 11:00 AM

## 2023-10-12 NOTE — H&P (Signed)
 History and Physical    [name]:[id number] DOB: October 31, 1965 DOA: 10/11/2023  PCP: Bevely Doffing, FNP   Patient coming from: Home   Chief Complaint:  Chief Complaint  Patient presents with   Fall   Nausea   Emesis    HPI:  [name] is a 58 y.o. male with hx of DM1, HTN, HLD, recent admission 9/10 - 13 with C diff colitis and dehydration, syncope, who returns with recurrent syncopal episode and ongoing N/V/D.  Reports his symptoms of diarrhea had improved since his recent hospitalization although today returned with having 4 watery bowel movements.  Also had 2 episodes of emesis which is clear.  Notes that he was out helping putting a rack on the car when he started to feel generally weak became nauseous, went to the bathroom but passed out before reaching it and fell and hit his head.  Then while sitting on the commode had a recurrent syncopal episode.  Chills today otherwise no fevers.  No cough/cold, associated abdominal pain, urinary changes, rashes /other localizing infectious symptoms.  Reports that his symptoms initially started about 4 weeks ago when he was on a cruise in [place], he denies any drinking of fresh water/well water or abnormal exposures, was drinking bottled water only.   Review of Systems:  ROS complete and negative except as marked above   Allergies  Allergen Reactions   Crestor  [Rosuvastatin  Calcium ] Other (See Comments)    Muscle aches   Iron Other (See Comments)    Tore up my stomach  Liquid Iron   Lipitor [Atorvastatin  Calcium ] Other (See Comments)    Muscles aches   Statins Other (See Comments)    Muscle aches    Prior to Admission medications   Medication Sig Start Date End Date Taking? Authorizing Provider  amLODipine  (NORVASC ) 5 MG tablet TAKE 1 TABLET(5 MG) BY MOUTH DAILY Patient taking differently: Take 5 mg by mouth daily. TAKE 1 TABLET(5 MG) BY MOUTH DAILY 12/07/22   Melvenia Manus BRAVO, MD  Continuous Glucose Sensor (DEXCOM G7  SENSOR) MISC Use to monitor blood sugar continuously. Change sensor every 10 days as directed. 01/29/23   Therisa Benton PARAS, NP  Continuous Glucose Sensor (DEXCOM G7 SENSOR) MISC Use as directed and change every 10 days. 10/01/23   Therisa Benton PARAS, NP  esomeprazole  (NEXIUM ) 40 MG capsule Take 1 capsule (40 mg total) by mouth 2 (two) times daily before a meal. 09/11/23   Huenink, Doffing, FNP  Evolocumab  (REPATHA ) 140 MG/ML SOSY Inject 140 mg into the skin every 14 (fourteen) days. Patient not taking: Reported on 09/27/2023 07/17/23   Bevely Doffing, FNP  Evolocumab  (REPATHA ) 140 MG/ML SOSY Inject 140 mg into the skin every 14 (fourteen) days. Patient not taking: Reported on 09/27/2023 08/27/23   Bevely Doffing, FNP  Insulin  Disposable Pump (OMNIPOD 5 DEXG7G6 PODS GEN 5) MISC CHANGE POD EVERY 48 TO 72 HOURS 03/21/23   Therisa Benton PARAS, NP  Insulin  Disposable Pump (OMNIPOD 5 DEXG7G6 PODS GEN 5) MISC Change pod every 48 to 72 hours 05/30/23   Therisa Benton PARAS, NP  Insulin  Disposable Pump (OMNIPOD 5 G6 INTRO, GEN 5,) KIT Change pod every 48-72 hrs 06/07/21   Therisa Benton PARAS, NP  insulin  lispro (HUMALOG ) 100 UNIT/ML injection Use with Omnipod for total daily dose of 50 units per day. 04/16/23   Therisa Benton PARAS, NP  olmesartan  (BENICAR ) 40 MG tablet Take 1 tablet (40 mg total) by mouth daily. Patient not taking: Reported  on 09/21/2023 08/27/23   Bevely Doffing, FNP  ondansetron  (ZOFRAN ) 4 MG tablet Take 1 tablet (4 mg total) by mouth every 8 (eight) hours as needed for nausea or vomiting. 09/21/23   Glennon Sand, NP  triamcinolone  cream (KENALOG ) 0.1 % Apply topically 2 (two) times daily. Patient not taking: Reported on 09/27/2023 07/17/23   Bevely Doffing, FNP    Past Medical History:  Diagnosis Date   Allergy    Anemia    Diabetes mellitus without complication (HCC)    type 1 per patient   GERD (gastroesophageal reflux disease)    HLD (hyperlipidemia)    Hypertension     Past Surgical History:   Procedure Laterality Date   ANKLE SURGERY Right    LUMBAR LAMINECTOMY/DECOMPRESSION MICRODISCECTOMY N/A 10/07/2021   Procedure: Microlumbar decompression L4-5, possible L5-S1;  Surgeon: Duwayne Purchase, MD;  Location: [facility] OR;  Service: Orthopedics;  Laterality: N/A;   UPPER GASTROINTESTINAL ENDOSCOPY     VASECTOMY  1994     reports that he has been smoking cigarettes. He has a 30 pack-year smoking history. He has never used smokeless tobacco. He reports that he does not currently use alcohol. He reports that he does not use drugs.  Family History  Problem Relation Age of Onset   COPD Mother    Diabetes Mother    Early death Father 79       MI   Heart attack Father    Lymphoma Sister 34   Hypertension Brother    Diabetes Maternal Grandmother    Stroke Neg Hx    Alcohol abuse Neg Hx    Drug abuse Neg Hx    Hyperlipidemia Neg Hx    Kidney disease Neg Hx    Colon cancer Neg Hx    Esophageal cancer Neg Hx    Stomach cancer Neg Hx    Rectal cancer Neg Hx      Physical Exam: Vitals:   10/11/23 2102 10/11/23 2108 10/11/23 2200 10/11/23 2300  BP: 124/71  (!) 112/59 125/69  Pulse: (!) 108  99 (!) 102  Resp: 18  19   Temp:  98.7 F (37.1 C)    TempSrc:      SpO2: 94%  93% 94%  Weight:      Height:        Gen: Awake, alert, NAD   CV: Regular, normal S1, S2, no murmurs  Resp: Normal WOB, CTAB  Abd: Flat, hyperactive, nontender MSK: Symmetric, no edema  Skin: No rashes or lesions to exposed skin  Neuro: Alert and interactive  Psych: euthymic, appropriate    Data review:   Labs reviewed, notable for:   Na 129  Bicarb 16  AG13  Lactate 0.8 -> 0.7  Cr 1.31 (b/l ~ 1 ) WBC 28; neutrophil predominant  Hb 12  UA neg for infection  C diff negative    Micro:  Results for orders placed or performed during the hospital encounter of 10/11/23  C Difficile Quick Screen w PCR reflex     Status: None   Collection Time: 10/11/23  9:11 PM   Specimen: STOOL  Result Value Ref  Range Status   C Diff antigen NEGATIVE NEGATIVE Final   C Diff toxin NEGATIVE NEGATIVE Final   C Diff interpretation No C. difficile detected.  Final    Comment: Performed at [facility], [address], [place], [place] [address]    Imaging reviewed:  CT CERVICAL SPINE WO CONTRAST Result Date: 10/11/2023 EXAM: CT CERVICAL  SPINE WITHOUT CONTRAST 10/11/2023 05:30:34 PM TECHNIQUE: CT of the cervical spine was performed without the administration of intravenous contrast. Multiplanar reformatted images are provided for review. Automated exposure control, iterative reconstruction, and/or weight based adjustment of the mA/kV was utilized to reduce the radiation dose to as low as reasonably achievable. COMPARISON: None available. CLINICAL HISTORY: Polytrauma, blunt. c/o n/v/d, hypotension, and a fall today. Pt has an abrasion to the top of his head. Pt c/o SOB, and chills. FINDINGS: CERVICAL SPINE: BONES AND ALIGNMENT: No acute fracture or traumatic malalignment. DEGENERATIVE CHANGES: Mild degenerative changes, most prominent at C5-6. SOFT TISSUES: No prevertebral soft tissue swelling. IMPRESSION: 1. No acute abnormality of the cervical spine. Electronically signed by: Pinkie Pebbles MD 10/11/2023 06:20 PM EDT RP Workstation: [id number]   CT HEAD WO CONTRAST Result Date: 10/11/2023 EXAM: CT HEAD WITHOUT CONTRAST 10/11/2023 05:30:34 PM TECHNIQUE: CT of the head was performed without the administration of intravenous contrast. Automated exposure control, iterative reconstruction, and/or weight based adjustment of the mA/kV was utilized to reduce the radiation dose to as low as reasonably achievable. COMPARISON: 06/02/2021 CLINICAL HISTORY: Polytrauma, blunt. c/o n/v/d, hypotension, and a fall today. Pt has an abrasion to the top of his head. Pt c/o SOB, and chills. FINDINGS: BRAIN AND VENTRICLES: Mild subcortical and periventricular small vessel ischemic changes. Mild intracranial atherosclerosis. No acute  hemorrhage. No evidence of acute infarct. No hydrocephalus. No extra-axial collection. No mass effect or midline shift. ORBITS: No acute abnormality. SINUSES: No acute abnormality. SOFT TISSUES AND SKULL: Abrasion to the top of the head. No skull fracture. IMPRESSION: 1. No acute intracranial abnormality. Electronically signed by: Pinkie Pebbles MD 10/11/2023 06:20 PM EDT RP Workstation: [id number]   CT ABDOMEN PELVIS W CONTRAST Result Date: 10/11/2023 EXAM: CT ABDOMEN AND PELVIS WITH CONTRAST 10/11/2023 05:30:34 PM TECHNIQUE: CT of the abdomen and pelvis was performed with the administration of 100 mL iohexol  (OMNIPAQUE ) 300 MG/ML solution. Multiplanar reformatted images are provided for review. Automated exposure control, iterative reconstruction, and/or weight-based adjustment of the mA/kV was utilized to reduce the radiation dose to as low as reasonably achievable. COMPARISON: 09/26/2023 CLINICAL HISTORY: Abdominal pain, acute, nonlocalized. c/o n/v/d, hypotension, and a fall today. Pt has an abrasion to the top of his head. Pt c/o SOB, and chills. FINDINGS: LOWER CHEST: No acute abnormality. LIVER: The liver is unremarkable. GALLBLADDER AND BILE DUCTS: Gallbladder is unremarkable. No biliary ductal dilatation. SPLEEN: No acute abnormality. PANCREAS: No acute abnormality. ADRENAL GLANDS: No acute abnormality. KIDNEYS, URETERS AND BLADDER: No stones in the kidneys or ureters. No hydronephrosis. Mild nonspecific perinephric stranding bilaterally, unchanged, without heterogeneous enhancement to suggest pyelonephritis. Urinary bladder is mildly thick walled, correlate for cystitis. GI AND BOWEL: Stomach demonstrates no acute abnormality. There is no bowel obstruction. PERITONEUM AND RETROPERITONEUM: No ascites. No free air. VASCULATURE: Atherosclerotic calcifications of the abdominal aorta and branch vessels, although patent. LYMPH NODES: No lymphadenopathy. REPRODUCTIVE ORGANS: Prostate is unremarkable. BONES  AND SOFT TISSUES: No acute osseous abnormality. No focal soft tissue abnormality. IMPRESSION: 1. Mild bladder wall thickening, correlate for cystitis. 2. Otherwise, no acute findings. Electronically signed by: Pinkie Pebbles MD 10/11/2023 05:59 PM EDT RP Workstation: [id number]   DG Chest Port 1 View Result Date: 10/11/2023 CLINICAL DATA:  Syncope. EXAM: PORTABLE CHEST 1 VIEW COMPARISON:  09/26/2023 FINDINGS: Heart size and pulmonary vascularity are normal. Lungs are clear. No pleural effusion or pneumothorax. Mediastinal contours appear intact. Degenerative changes in the spine and shoulders. IMPRESSION: No active disease. Electronically Signed  By: Elsie Gravely M.D.   On: 10/11/2023 16:00    EKG:  Personally reviewed, sinus rhythm, no acute ischemic changes  ED Course:  Treated with 2 L IV fluid, ordered for third liter, Tylenol , Zofran .  Insulin  pump became dislodged when going to CT and developed worsening hyperglycemia after this, treated with aspart 10 units.   Assessment/Plan:  58 y.o. male with hx DM1, HTN, HLD, recent admission 9/10 - 13 with C diff colitis and dehydration, syncope, who returns with recurrent syncopal episode and ongoing N/V/D    Syncopal episode  Hypotension, likely hypovolemic resolved  Leukocytosis, worsened  Recurrent diarrhea x 1 day, 2 ep of emesis which is new. and restarted on antiHTN with Olmesartan  yesterday as OP. Syncopal episode x 2 with vagal prodrome, and hypotensive to 70s systolic with EMS; improved with 1 L IVF en route. Remains intermittently tachycardic in low 100s. WBC 28.8. C Diff negative, UA neg for infection, CXR without infiltrate, CT A/P with bladder wall thickening, and no other acute findings. Overall appears definite hypovolemia, source of his worsening leukocytosis is unclear, will need additional GI pathogen testing. ? Post infectious diarrhea / malabsorption possibility  -- For now hold on abx, supportive care. If worsening  sepsis can start on CTX / Flagyl  with additional Vancomycin  for c diff prophylaxis  -- Continue on mIVF at 100 cc/hr  -- Check GI pathogen panel, O+P, Flu / covid / rsv, procalcitonin. F/u blood cultures.  -- Trend CBC,  -- Tele monitoring, orthostatics in AM   Worsening hyperglycemia  I/s/o disconnected insulin  pump  Hx DM type 1  -- Start Semglee  12U while off pump (reports pump rate is ~0.5U per hr and does carb correction with meals) -- Aspart q4 hr overnight to help with control --> then to [facility] HS tomorrow for sensitive  -- Can resume insulin  pump when available   NAGMA  Likely related to ongoing diarrhea  -- IVF per above   Chronic medical problems  HTN: Hold home antiHTN for now  HLD: on repatha  OP  GERD: would stop PPI with c diff risk, famotidine  prn   Body mass index is 24.54 kg/m.    DVT prophylaxis:  Lovenox  Code Status:  Full Code Diet:  Diet Orders (From admission, onward)    None      Family Communication:  None   Consults:  None   Admission status:   Observation, Telemetry bed  Severity of Illness: The appropriate patient status for this patient is OBSERVATION. Observation status is judged to be reasonable and necessary in order to provide the required intensity of service to ensure the patient's safety. The patient's presenting symptoms, physical exam findings, and initial radiographic and laboratory data in the context of their medical condition is felt to place them at decreased risk for further clinical deterioration. Furthermore, it is anticipated that the patient will be medically stable for discharge from the hospital within 2 midnights of admission.    Dorn Dawson, MD [facility]  How to contact the [facility] Attending or Consulting provider 7A - 7P or covering provider during after hours 7P -7A, for this patient.  Check the care team in [facility] and look for a) attending/consulting [facility] provider listed and b) the [facility] team listed Log into  [email or web address] and use [facility] universal password to access. If you do not have the password, please contact the hospital operator. Locate the [facility] provider you are looking for under [facility] and page to a number  that you can be directly reached. If you still have difficulty reaching the provider, please page the [facility] (Director on Call) for the Hospitalists listed on amion for assistance.  10/12/2023, 1:00 AM

## 2023-10-12 NOTE — Progress Notes (Signed)
 Provider KATHEE Cone NP notified of blood sugar of 459, stat lab draw ordered.

## 2023-10-12 NOTE — Inpatient Diabetes Management (Signed)
 Inpatient Diabetes Program Recommendations  AACE/ADA: New Consensus Statement on Inpatient Glycemic Control (2015)  Target Ranges:  Prepandial:   less than 140 mg/dL      Peak postprandial:   less than 180 mg/dL (1-2 hours)      Critically ill patients:  140 - 180 mg/dL   Lab Results  Component Value Date   GLUCAP 242 (H) 10/12/2023   HGBA1C 8.1 (A) 07/24/2023    Review of Glycemic Control  Diabetes history: DM1 Outpatient Diabetes medications: omnipod Insulin  Pump Current orders for Inpatient glycemic control: Lantus  12 units daily Novolog  0-15 units tid, 0-5 units hs  Inpatient Diabetes Program Recommendations:    Spoke with patient via phone (DM coordinator working @ [employer] [facility]). Patient's wife is bringing insulin  pump and supplies this afternoon. Please consider: Restart insulin  pump @ hs. Last Lantus  12 units received @ 0342 am last hs. Discussed with Dr. Maree and he plans to D/C pt. Soon. Will need to D/C Novolog  correction if pt. Not discharged and insulin  pump restarted.  Thank you, Keren Alverio E. Camil Hausmann, RN, MSN, CNS, CDCES  Diabetes Coordinator Inpatient Glycemic Control Team Team Pager [phone] (8am-5pm) 10/12/2023 1:31 PM

## 2023-10-12 NOTE — Progress Notes (Signed)
 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 Reflex is pending. I have messaged patients PCP Leita Longs NP re: positive result, and have requested that she send for confirmatory testing, HIV RNA, CD4 counts, and to notify patient of result at upcoming appointment on 10/1   Dorn Dawson, MD  [facility]

## 2023-10-12 NOTE — Progress Notes (Signed)
 Verbal order to hold 0800 sliding scale insulin .  From KATHEE Cone NP

## 2023-10-12 NOTE — Progress Notes (Signed)
   10/12/23 0800  Vitals  Temp 98.9 F (37.2 C)  Temp Source Oral  BP 121/76  MAP (mmHg) 90  BP Location Right Arm  BP Method Automatic  Patient Position (if appropriate) Standing  Pulse Rate 75  Pulse Rate Source Dinamap  Level of Consciousness  Level of Consciousness Alert  MEWS COLOR  MEWS Score Color Green  Orthostatic Lying   BP- Lying 126/78  Pulse- Lying 77  Orthostatic Sitting  BP- Sitting 123/85  Pulse- Sitting 80  Orthostatic Standing at 0 minutes  BP- Standing at 0 minutes 116/82  Pulse- Standing at 0 minutes 88  Orthostatic Standing at 3 minutes  BP- Standing at 3 minutes 121/76  Pulse- Standing at 3 minutes 87  Oxygen Therapy  SpO2 97 %  O2 Device Room Air  Pain Assessment  Pain Scale 0-10  Pain Score 2  Glasgow Coma Scale  Eye Opening 4  Best Verbal Response (NON-intubated) 5  Best Motor Response 6  Glasgow Coma Scale Score 15  MEWS Score  MEWS Temp 0  MEWS Systolic 0  MEWS Pulse 0  MEWS RR 0  MEWS LOC 0  MEWS Score 0

## 2023-10-12 NOTE — Progress Notes (Signed)
 Mobility Specialist Progress Note:    10/12/23 1340  Mobility  Activity Ambulated with assistance  Level of Assistance Independent  Assistive Device None  Distance Ambulated (ft) 300 ft  Range of Motion/Exercises Active;All extremities  Activity Response Tolerated well  Mobility Referral Yes  Mobility visit 1 Mobility  Mobility Specialist Start Time (ACUTE ONLY) 1340  Mobility Specialist Stop Time (ACUTE ONLY) 1400  Mobility Specialist Time Calculation (min) (ACUTE ONLY) 20 min   Pt received in bed, agreeable to mobility. Independently able to stand and ambulate with no AD. Tolerated well,asx throughout. Left sitting EOB, all needs met.  [name] Mobility Specialist Please contact via Special educational needs teacher or  Rehab office at [phone]

## 2023-10-12 NOTE — Plan of Care (Signed)
  Problem: Acute Rehab PT Goals(only PT should resolve) Goal: Pt Will Ambulate Outcome: Progressing Flowsheets (Taken 10/12/2023 1117) Pt will Ambulate:  > 125 feet  Independently Goal: Pt Will Go Up/Down Stairs Outcome: Progressing Flowsheets (Taken 10/12/2023 1117) Pt will Go Up / Down Stairs:  3-5 stairs  with modified independence Goal: Pt/caregiver will Perform Home Exercise Program Outcome: Progressing Flowsheets (Taken 10/12/2023 1117) Pt/caregiver will Perform Home Exercise Program:  For increased strengthening  For improved balance  Independently   [name], PT, DPT

## 2023-10-12 NOTE — Progress Notes (Signed)
   10/12/23 1406  TOC Brief Assessment  Insurance and Status Reviewed  Patient has primary care physician Yes  Home environment has been reviewed Home with spouse  Prior level of function: Independent  Prior/Current Home Services No current home services  Social Drivers of Health Review SDOH reviewed no interventions necessary  Readmission risk has been reviewed Yes  Transition of care needs no transition of care needs at this time   PT recommending Out Patient PT, orders place for patient to decide and find out the copay. Patient concerned with being out of work, has a PCP, and insurance. Patient advise to call Social services to check on Disability, if this and needed and if he will qualify.   Transition of Care Department [facility]) has reviewed patient and no TOC needs have been identified at this time. We will continue to monitor patient advancement through interdisciplinary progression rounds. If new patient transition needs arise, please place a TOC consult.

## 2023-10-12 NOTE — Discharge Summary (Signed)
 Physician Discharge Summary  XZANDER GILHAM FMW:[id number] DOB: [birth year] DOA: 10/11/2023  PCP: Bevely Doffing, FNP  Admit date: 10/11/2023  Discharge date: 10/12/2023  Admitted From:Home  Disposition:  Home  Recommendations for Outpatient Follow-up:  Follow up with PCP in 1-2 weeks Remain on medications as prior and follow-up with endocrinologist to consider possible exocrine causes of ongoing diarrhea, Creon  prescribed for now to try with meals to see if this will be of assistance C. difficile study and GI panel negative Continue insulin  pump as prior Hold olmesartan  for now and follow-up with PCP to repeat BMP in 1 week to ensure renal function is stable prior to resuming and okay to allow some elevated BP readings for now  Home Health: None  Equipment/Devices: None  Discharge Condition:Stable  CODE STATUS: Full  Diet recommendation: Heart Healthy/carb modified  Brief/Interim Summary: JOQUAN LOTZ is a 58 y.o. male with hx of DM1, HTN, HLD, recent admission 9/10 - 13 with C diff colitis and dehydration, syncope, who returns with recurrent syncopal episode and ongoing N/V/D.  Reports his symptoms of diarrhea had improved since his recent hospitalization although today returned with having 4 watery bowel movements.  He had repeat C. difficile study which was negative and GI pathogen panel was negative as well.  CT does not demonstrate any acute findings with no colitis noted.  He is now tolerating diet with no nausea or vomiting and having some mild loose stools, but this is much improved.  He did have significant hyperglycemia develop as a result of having to remove his pump during the CT scan, but this is now improving as well and he may resume his pump.  It appears that he may have some pancreatic exocrine insufficiency which may be causing his diarrhea for which she has been prescribed some Creon  to start using to see if this will hopefully be of benefit.  He will need to hold his home  olmesartan  for now and follow-up BMP in 1 week with PCP.  No other acute events or concerns noted.  Discharge Diagnoses:  Principal Problem:   Syncope Active Problems:   Uncontrolled type 1 diabetes mellitus with hyperglycemia, with long-term current use of insulin  (HCC)   Gastroenteritis   Dehydration  Principal discharge diagnosis: Syncope likely in the setting of dehydration with mild gastroenteritis/chronic diarrhea possibly related to exocrine pancreatic insufficiency.  Hyperglycemia in the setting of type 1 diabetes that is resolved.  Discharge Instructions  Discharge Instructions     Diet - low sodium heart healthy   Complete by: As directed    Increase activity slowly   Complete by: As directed       Allergies as of 10/12/2023       Reactions   Crestor  [rosuvastatin  Calcium ] Other (See Comments)   Muscle aches   Iron Other (See Comments)   Tore up my stomach  Liquid Iron   Lipitor [atorvastatin  Calcium ] Other (See Comments)   Muscles aches   Statins Other (See Comments)   Muscle aches        Medication List     STOP taking these medications    olmesartan  40 MG tablet Commonly known as: BENICAR        TAKE these medications    amLODipine  5 MG tablet Commonly known as: NORVASC  TAKE 1 TABLET(5 MG) BY MOUTH DAILY What changed:  how much to take how to take this when to take this   Dexcom G7 Sensor Misc Use to monitor blood sugar continuously.  Change sensor every 10 days as directed.   Dexcom G7 Sensor Misc Use as directed and change every 10 days.   esomeprazole  40 MG capsule Commonly known as: NEXIUM  Take 1 capsule (40 mg total) by mouth 2 (two) times daily before a meal.   insulin  lispro 100 UNIT/ML injection Commonly known as: HUMALOG  Use with Omnipod for total daily dose of 50 units per day.   lipase/protease/amylase 12000-38000 units Cpep capsule Commonly known as: CREON  Take 1 capsule (12,000 Units total) by mouth 3 (three) times  daily before meals.   Omnipod 5 DexG7G6 Intro Gen 5 Kit Change pod every 48-72 hrs   Omnipod 5 DexG7G6 Pods Gen 5 Misc CHANGE POD EVERY 48 TO 72 HOURS   Omnipod 5 DexG7G6 Pods Gen 5 Misc Change pod every 48 to 72 hours   ondansetron  4 MG tablet Commonly known as: ZOFRAN  Take 1 tablet (4 mg total) by mouth every 8 (eight) hours as needed for nausea or vomiting.   Repatha  140 MG/ML Sosy Generic drug: Evolocumab  Inject 140 mg into the skin every 14 (fourteen) days.   Repatha  140 MG/ML Sosy Generic drug: Evolocumab  Inject 140 mg into the skin every 14 (fourteen) days.   triamcinolone  cream 0.1 % Commonly known as: KENALOG  Apply topically 2 (two) times daily.        Follow-up Information     Bevely Doffing, FNP. Schedule an appointment as soon as possible for a visit in 1 week(s).   Specialty: Family Medicine Contact information: [address] [place] [address] [phone]                Allergies  Allergen Reactions   Crestor  [Rosuvastatin  Calcium ] Other (See Comments)    Muscle aches   Iron Other (See Comments)    Tore up my stomach  Liquid Iron   Lipitor [Atorvastatin  Calcium ] Other (See Comments)    Muscles aches   Statins Other (See Comments)    Muscle aches    Consultations: None   Procedures/Studies: CT CERVICAL SPINE WO CONTRAST Result Date: 10/11/2023 EXAM: CT CERVICAL SPINE WITHOUT CONTRAST 10/11/2023 05:30:34 PM TECHNIQUE: CT of the cervical spine was performed without the administration of intravenous contrast. Multiplanar reformatted images are provided for review. Automated exposure control, iterative reconstruction, and/or weight based adjustment of the mA/kV was utilized to reduce the radiation dose to as low as reasonably achievable. COMPARISON: None available. CLINICAL HISTORY: Polytrauma, blunt. c/o n/v/d, hypotension, and a fall today. Pt has an abrasion to the top of his head. Pt c/o SOB, and chills. FINDINGS:  CERVICAL SPINE: BONES AND ALIGNMENT: No acute fracture or traumatic malalignment. DEGENERATIVE CHANGES: Mild degenerative changes, most prominent at C5-6. SOFT TISSUES: No prevertebral soft tissue swelling. IMPRESSION: 1. No acute abnormality of the cervical spine. Electronically signed by: Pinkie Pebbles MD 10/11/2023 06:20 PM EDT RP Workstation: [id number]   CT HEAD WO CONTRAST Result Date: 10/11/2023 EXAM: CT HEAD WITHOUT CONTRAST 10/11/2023 05:30:34 PM TECHNIQUE: CT of the head was performed without the administration of intravenous contrast. Automated exposure control, iterative reconstruction, and/or weight based adjustment of the mA/kV was utilized to reduce the radiation dose to as low as reasonably achievable. COMPARISON: 06/02/2021 CLINICAL HISTORY: Polytrauma, blunt. c/o n/v/d, hypotension, and a fall today. Pt has an abrasion to the top of his head. Pt c/o SOB, and chills. FINDINGS: BRAIN AND VENTRICLES: Mild subcortical and periventricular small vessel ischemic changes. Mild intracranial atherosclerosis. No acute hemorrhage. No evidence of acute infarct. No hydrocephalus.  No extra-axial collection. No mass effect or midline shift. ORBITS: No acute abnormality. SINUSES: No acute abnormality. SOFT TISSUES AND SKULL: Abrasion to the top of the head. No skull fracture. IMPRESSION: 1. No acute intracranial abnormality. Electronically signed by: Pinkie Pebbles MD 10/11/2023 06:20 PM EDT RP Workstation: [id number]   CT ABDOMEN PELVIS W CONTRAST Result Date: 10/11/2023 EXAM: CT ABDOMEN AND PELVIS WITH CONTRAST 10/11/2023 05:30:34 PM TECHNIQUE: CT of the abdomen and pelvis was performed with the administration of 100 mL iohexol  (OMNIPAQUE ) 300 MG/ML solution. Multiplanar reformatted images are provided for review. Automated exposure control, iterative reconstruction, and/or weight-based adjustment of the mA/kV was utilized to reduce the radiation dose to as low as reasonably achievable. COMPARISON:  09/26/2023 CLINICAL HISTORY: Abdominal pain, acute, nonlocalized. c/o n/v/d, hypotension, and a fall today. Pt has an abrasion to the top of his head. Pt c/o SOB, and chills. FINDINGS: LOWER CHEST: No acute abnormality. LIVER: The liver is unremarkable. GALLBLADDER AND BILE DUCTS: Gallbladder is unremarkable. No biliary ductal dilatation. SPLEEN: No acute abnormality. PANCREAS: No acute abnormality. ADRENAL GLANDS: No acute abnormality. KIDNEYS, URETERS AND BLADDER: No stones in the kidneys or ureters. No hydronephrosis. Mild nonspecific perinephric stranding bilaterally, unchanged, without heterogeneous enhancement to suggest pyelonephritis. Urinary bladder is mildly thick walled, correlate for cystitis. GI AND BOWEL: Stomach demonstrates no acute abnormality. There is no bowel obstruction. PERITONEUM AND RETROPERITONEUM: No ascites. No free air. VASCULATURE: Atherosclerotic calcifications of the abdominal aorta and branch vessels, although patent. LYMPH NODES: No lymphadenopathy. REPRODUCTIVE ORGANS: Prostate is unremarkable. BONES AND SOFT TISSUES: No acute osseous abnormality. No focal soft tissue abnormality. IMPRESSION: 1. Mild bladder wall thickening, correlate for cystitis. 2. Otherwise, no acute findings. Electronically signed by: Pinkie Pebbles MD 10/11/2023 05:59 PM EDT RP Workstation: [id number]   DG Chest Port 1 View Result Date: 10/11/2023 CLINICAL DATA:  Syncope. EXAM: PORTABLE CHEST 1 VIEW COMPARISON:  09/26/2023 FINDINGS: Heart size and pulmonary vascularity are normal. Lungs are clear. No pleural effusion or pneumothorax. Mediastinal contours appear intact. Degenerative changes in the spine and shoulders. IMPRESSION: No active disease. Electronically Signed   By: Elsie Gravely M.D.   On: 10/11/2023 16:00   CT ABDOMEN PELVIS W CONTRAST Result Date: 09/26/2023 CLINICAL DATA:  Sepsis EXAM: CT ABDOMEN AND PELVIS WITH CONTRAST TECHNIQUE: Multidetector CT imaging of the abdomen and pelvis was  performed using the standard protocol following bolus administration of intravenous contrast. RADIATION DOSE REDUCTION: This exam was performed according to the departmental dose-optimization program which includes automated exposure control, adjustment of the mA and/or kV according to patient size and/or use of iterative reconstruction technique. CONTRAST:  80mL OMNIPAQUE  IOHEXOL  300 MG/ML  SOLN COMPARISON:  05/22/2011 FINDINGS: Lower chest: Calcifications in the right coronary artery. Dependent atelectasis in the lower lobes. No effusions. Hepatobiliary: No focal hepatic abnormality. Gallbladder unremarkable. Pancreas: No focal abnormality or ductal dilatation. Spleen: No focal abnormality.  Normal size. Adrenals/Urinary Tract: Mild bilateral perinephric stranding is new since prior study, nonspecific. No adrenal or renal mass. No stones or hydronephrosis. Urinary bladder unremarkable. Stomach/Bowel: Left colonic diverticulosis. There appears to be mild wall thickening involving the descending colon and sigmoid colon suggesting colitis. No bowel obstruction. Stomach and small bowel decompressed, unremarkable. Normal appendix. Vascular/Lymphatic: Aortic atherosclerosis. No evidence of aneurysm or adenopathy. Reproductive: No visible focal abnormality. Other: No free fluid or free air. Musculoskeletal: No acute bony abnormality. IMPRESSION: Mild apparent wall thickening in the descending colon and sigmoid colon concerning for colitis. Scattered left colonic diverticulosis. Dependent atelectasis in  the lower lobes. Aortic atherosclerosis. Electronically Signed   By: Franky Crease M.D.   On: 09/26/2023 21:17   DG Chest Port 1 View if patient is in a treatment room. Result Date: 09/26/2023 CLINICAL DATA:  Suspected sepsis. EXAM: PORTABLE CHEST 1 VIEW COMPARISON:  Chest radiograph dated 06/02/2021. FINDINGS: No focal consolidation, pleural effusion, or pneumothorax. The cardiac silhouette is within normal limits. No  acute osseous pathology. IMPRESSION: No active disease. Electronically Signed   By: Vanetta Chou M.D.   On: 09/26/2023 19:12     Discharge Exam: Vitals:   10/12/23 0447 10/12/23 0800  BP: 132/80 121/76  Pulse: 89 75  Resp: 18   Temp: 99.8 F (37.7 C) 98.9 F (37.2 C)  SpO2: 93% 97%   Vitals:   10/12/23 0243 10/12/23 0447 10/12/23 0500 10/12/23 0800  BP: 129/80 132/80  121/76  Pulse: 85 89  75  Resp: 18 18    Temp: 98.2 F (36.8 C) 99.8 F (37.7 C)  98.9 F (37.2 C)  TempSrc:    Oral  SpO2: 98% 93%  97%  Weight: 76.3 kg  76.3 kg   Height: 5' 10 (1.778 m)       General: Pt is alert, awake, not in acute distress Cardiovascular: RRR, S1/S2 +, no rubs, no gallops Respiratory: CTA bilaterally, no wheezing, no rhonchi Abdominal: Soft, NT, ND, bowel sounds + Extremities: no edema, no cyanosis    The results of significant diagnostics from this hospitalization (including imaging, microbiology, ancillary and laboratory) are listed below for reference.     Microbiology: Recent Results (from the past 240 hours)  C Difficile Quick Screen w PCR reflex     Status: None   Collection Time: 10/11/23  9:11 PM   Specimen: STOOL  Result Value Ref Range Status   C Diff antigen NEGATIVE NEGATIVE Final   C Diff toxin NEGATIVE NEGATIVE Final   C Diff interpretation No C. difficile detected.  Final    Comment: Performed at [facility], [address], [place], [place] [address]  Gastrointestinal Panel by PCR , Stool     Status: None   Collection Time: 10/11/23  9:11 PM   Specimen: Stool  Result Value Ref Range Status   Campylobacter species NOT DETECTED NOT DETECTED Final   Plesimonas shigelloides NOT DETECTED NOT DETECTED Final   Salmonella species NOT DETECTED NOT DETECTED Final   Yersinia enterocolitica NOT DETECTED NOT DETECTED Final   Vibrio species NOT DETECTED NOT DETECTED Final   Vibrio cholerae NOT DETECTED NOT DETECTED Final   Enteroaggregative E coli (EAEC) NOT  DETECTED NOT DETECTED Final   Enteropathogenic E coli (EPEC) NOT DETECTED NOT DETECTED Final   Enterotoxigenic E coli (ETEC) NOT DETECTED NOT DETECTED Final   Shiga like toxin producing E coli (STEC) NOT DETECTED NOT DETECTED Final   Shigella/Enteroinvasive E coli (EIEC) NOT DETECTED NOT DETECTED Final   Cryptosporidium NOT DETECTED NOT DETECTED Final   Cyclospora cayetanensis NOT DETECTED NOT DETECTED Final   Entamoeba histolytica NOT DETECTED NOT DETECTED Final   Giardia lamblia NOT DETECTED NOT DETECTED Final   Adenovirus F40/41 NOT DETECTED NOT DETECTED Final   Astrovirus NOT DETECTED NOT DETECTED Final   Norovirus GI/GII NOT DETECTED NOT DETECTED Final   Rotavirus A NOT DETECTED NOT DETECTED Final   Sapovirus (I, II, IV, and V) NOT DETECTED NOT DETECTED Final    Comment: Performed at [facility], [address] [place], [place] [address]  Culture, blood (routine x 2)  Status: None (Preliminary result)   Collection Time: 10/11/23  9:26 PM   Specimen: BLOOD RIGHT FOREARM  Result Value Ref Range Status   Specimen Description BLOOD RIGHT FOREARM  Final   Special Requests   Final    BOTTLES DRAWN AEROBIC AND ANAEROBIC Blood Culture adequate volume   Culture   Final    NO GROWTH < 12 HOURS Performed at [facility], [address], [place], [place] [address]    Report Status PENDING  Incomplete  Culture, blood (routine x 2)     Status: None (Preliminary result)   Collection Time: 10/11/23  9:26 PM   Specimen: BLOOD  Result Value Ref Range Status   Specimen Description BLOOD BLOOD RIGHT WRIST  Final   Special Requests   Final    BOTTLES DRAWN AEROBIC AND ANAEROBIC Blood Culture adequate volume   Culture   Final    NO GROWTH < 12 HOURS Performed at [facility], [address], [place], [place] [address]    Report Status PENDING  Incomplete  Resp panel by RT-PCR (RSV, Flu A&B, Covid) Anterior Nasal Swab     Status: None   Collection Time: 10/12/23  2:45 AM    Specimen: Anterior Nasal Swab  Result Value Ref Range Status   SARS Coronavirus 2 by RT PCR NEGATIVE NEGATIVE Final    Comment: (NOTE) SARS-CoV-2 target nucleic acids are NOT DETECTED.  The SARS-CoV-2 RNA is generally detectable in upper respiratory specimens during the acute phase of infection. The lowest concentration of SARS-CoV-2 viral copies this assay can detect is 138 copies/mL. A negative result does not preclude SARS-Cov-2 infection and should not be used as the sole basis for treatment or other patient management decisions. A negative result may occur with  improper specimen collection/handling, submission of specimen other than nasopharyngeal swab, presence of viral mutation(s) within the areas targeted by this assay, and inadequate number of viral copies(<138 copies/mL). A negative result must be combined with clinical observations, patient history, and epidemiological information. The expected result is Negative.  Fact Sheet for Patients:  BloggerCourse.com  Fact Sheet for Healthcare Providers:  SeriousBroker.it  This test is no t yet approved or cleared by the [place]  FDA and  has been authorized for detection and/or diagnosis of SARS-CoV-2 by FDA under an Emergency Use Authorization (EUA). This EUA will remain  in effect (meaning this test can be used) for the duration of the COVID-19 declaration under Section 564(b)(1) of the Act, 21 U.S.C.section 360bbb-3(b)(1), unless the authorization is terminated  or revoked sooner.       Influenza A by PCR NEGATIVE NEGATIVE Final   Influenza B by PCR NEGATIVE NEGATIVE Final    Comment: (NOTE) The Xpert Xpress SARS-CoV-2/FLU/RSV plus assay is intended as an aid in the diagnosis of influenza from Nasopharyngeal swab specimens and should not be used as a sole basis for treatment. Nasal washings and aspirates are unacceptable for Xpert Xpress  SARS-CoV-2/FLU/RSV testing.  Fact Sheet for Patients: BloggerCourse.com  Fact Sheet for Healthcare Providers: SeriousBroker.it  This test is not yet approved or cleared by the [place]  FDA and has been authorized for detection and/or diagnosis of SARS-CoV-2 by FDA under an Emergency Use Authorization (EUA). This EUA will remain in effect (meaning this test can be used) for the duration of the COVID-19 declaration under Section 564(b)(1) of the Act, 21 U.S.C. section 360bbb-3(b)(1), unless the authorization is terminated or revoked.     Resp Syncytial Virus by PCR NEGATIVE NEGATIVE Final  Comment: (NOTE) Fact Sheet for Patients: BloggerCourse.com  Fact Sheet for Healthcare Providers: SeriousBroker.it  This test is not yet approved or cleared by the [place]  FDA and has been authorized for detection and/or diagnosis of SARS-CoV-2 by FDA under an Emergency Use Authorization (EUA). This EUA will remain in effect (meaning this test can be used) for the duration of the COVID-19 declaration under Section 564(b)(1) of the Act, 21 U.S.C. section 360bbb-3(b)(1), unless the authorization is terminated or revoked.  Performed at [facility], [address] [place], [place] [address]      Labs: BNP (last 3 results) No results for input(s): BNP in the last 8760 hours. Basic Metabolic Panel: Recent Labs  Lab 10/10/23 1420 10/11/23 1542 10/11/23 2122 10/11/23 2126 10/12/23 0446 10/12/23 1135  NA 123* 129* 130*  --  124* 127*  K 4.4 4.0 3.9  --  4.3 4.4  CL 88* 100 98  --  97* 96*  CO2 21 16* 18*  --  20* 21*  GLUCOSE 129* 106* 100*  --  453* 231*  BUN 21 25* 24*  --  23* 24*  CREATININE 1.13 1.31* 1.19  --  1.37* 1.27*  CALCIUM  9.1 8.1* 8.0*  --  7.6* 7.7*  MG  --   --   --  1.4* 1.9  --   PHOS  --   --   --  3.0 3.1  --    Liver Function Tests: Recent Labs   Lab 10/10/23 1420 10/11/23 1542  AST 22 21  ALT 25 22  ALKPHOS 135* 90  BILITOT 0.2 0.6  PROT 6.6 5.9*  ALBUMIN 3.7* 2.9*   No results for input(s): LIPASE, AMYLASE in the last 168 hours. No results for input(s): AMMONIA in the last 168 hours. CBC: Recent Labs  Lab 10/10/23 1420 10/11/23 1542 10/12/23 0446  WBC 9.2 28.8* 13.9*  NEUTROABS 7.2* 26.6*  --   HGB 12.5* 12.6* 10.8*  HCT 36.6* 37.5* 31.2*  MCV 94 95.9 91.8  PLT 457* 388 330   Cardiac Enzymes: No results for input(s): CKTOTAL, CKMB, CKMBINDEX, TROPONINI in the last 168 hours. BNP: Invalid input(s): POCBNP CBG: Recent Labs  Lab 10/12/23 0046 10/12/23 0130 10/12/23 0445 10/12/23 0732 10/12/23 1120  GLUCAP 467* 467* 459* 395* 242*   D-Dimer No results for input(s): DDIMER in the last 72 hours. Hgb A1c No results for input(s): HGBA1C in the last 72 hours. Lipid Profile No results for input(s): CHOL, HDL, LDLCALC, TRIG, CHOLHDL, LDLDIRECT in the last 72 hours. Thyroid  function studies Recent Labs    10/11/23 2126  TSH 1.804   Anemia work up No results for input(s): VITAMINB12, FOLATE, FERRITIN, TIBC, IRON, RETICCTPCT in the last 72 hours. Urinalysis    Component Value Date/Time   COLORURINE STRAW (A) 10/11/2023 1948   APPEARANCEUR CLEAR 10/11/2023 1948   LABSPEC 1.018 10/11/2023 1948   PHURINE 5.0 10/11/2023 1948   GLUCOSEU NEGATIVE 10/11/2023 1948   GLUCOSEU >=1000 (A) 01/18/2016 1020   HGBUR SMALL (A) 10/11/2023 1948   BILIRUBINUR NEGATIVE 10/11/2023 1948   KETONESUR NEGATIVE 10/11/2023 1948   PROTEINUR 100 (A) 10/11/2023 1948   UROBILINOGEN 0.2 01/18/2016 1020   NITRITE NEGATIVE 10/11/2023 1948   LEUKOCYTESUR NEGATIVE 10/11/2023 1948   Sepsis Labs Recent Labs  Lab 10/10/23 1420 10/11/23 1542 10/12/23 0446  WBC 9.2 28.8* 13.9*   Microbiology Recent Results (from the past 240 hours)  C Difficile Quick Screen w PCR reflex     Status: None  Collection Time: 10/11/23  9:11 PM   Specimen: STOOL  Result Value Ref Range Status   C Diff antigen NEGATIVE NEGATIVE Final   C Diff toxin NEGATIVE NEGATIVE Final   C Diff interpretation No C. difficile detected.  Final    Comment: Performed at [facility], [address], [place], [place] [address]  Gastrointestinal Panel by PCR , Stool     Status: None   Collection Time: 10/11/23  9:11 PM   Specimen: Stool  Result Value Ref Range Status   Campylobacter species NOT DETECTED NOT DETECTED Final   Plesimonas shigelloides NOT DETECTED NOT DETECTED Final   Salmonella species NOT DETECTED NOT DETECTED Final   Yersinia enterocolitica NOT DETECTED NOT DETECTED Final   Vibrio species NOT DETECTED NOT DETECTED Final   Vibrio cholerae NOT DETECTED NOT DETECTED Final   Enteroaggregative E coli (EAEC) NOT DETECTED NOT DETECTED Final   Enteropathogenic E coli (EPEC) NOT DETECTED NOT DETECTED Final   Enterotoxigenic E coli (ETEC) NOT DETECTED NOT DETECTED Final   Shiga like toxin producing E coli (STEC) NOT DETECTED NOT DETECTED Final   Shigella/Enteroinvasive E coli (EIEC) NOT DETECTED NOT DETECTED Final   Cryptosporidium NOT DETECTED NOT DETECTED Final   Cyclospora cayetanensis NOT DETECTED NOT DETECTED Final   Entamoeba histolytica NOT DETECTED NOT DETECTED Final   Giardia lamblia NOT DETECTED NOT DETECTED Final   Adenovirus F40/41 NOT DETECTED NOT DETECTED Final   Astrovirus NOT DETECTED NOT DETECTED Final   Norovirus GI/GII NOT DETECTED NOT DETECTED Final   Rotavirus A NOT DETECTED NOT DETECTED Final   Sapovirus (I, II, IV, and V) NOT DETECTED NOT DETECTED Final    Comment: Performed at [facility], [address] [place], [place] [address]  Culture, blood (routine x 2)     Status: None (Preliminary result)   Collection Time: 10/11/23  9:26 PM   Specimen: BLOOD RIGHT FOREARM  Result Value Ref Range Status   Specimen Description BLOOD RIGHT FOREARM  Final   Special Requests    Final    BOTTLES DRAWN AEROBIC AND ANAEROBIC Blood Culture adequate volume   Culture   Final    NO GROWTH < 12 HOURS Performed at [facility], [address] [place], [place] [address]    Report Status PENDING  Incomplete  Culture, blood (routine x 2)     Status: None (Preliminary result)   Collection Time: 10/11/23  9:26 PM   Specimen: BLOOD  Result Value Ref Range Status   Specimen Description BLOOD BLOOD RIGHT WRIST  Final   Special Requests   Final    BOTTLES DRAWN AEROBIC AND ANAEROBIC Blood Culture adequate volume   Culture   Final    NO GROWTH < 12 HOURS Performed at [facility], [address], [place], [place] [address]    Report Status PENDING  Incomplete  Resp panel by RT-PCR (RSV, Flu A&B, Covid) Anterior Nasal Swab     Status: None   Collection Time: 10/12/23  2:45 AM   Specimen: Anterior Nasal Swab  Result Value Ref Range Status   SARS Coronavirus 2 by RT PCR NEGATIVE NEGATIVE Final    Comment: (NOTE) SARS-CoV-2 target nucleic acids are NOT DETECTED.  The SARS-CoV-2 RNA is generally detectable in upper respiratory specimens during the acute phase of infection. The lowest concentration of SARS-CoV-2 viral copies this assay can detect is 138 copies/mL. A negative result does not preclude SARS-Cov-2 infection and should not be used as the sole basis for  treatment or other patient management decisions. A negative result may occur with  improper specimen collection/handling, submission of specimen other than nasopharyngeal swab, presence of viral mutation(s) within the areas targeted by this assay, and inadequate number of viral copies(<138 copies/mL). A negative result must be combined with clinical observations, patient history, and epidemiological information. The expected result is Negative.  Fact Sheet for Patients:  BloggerCourse.com  Fact Sheet for Healthcare Providers:  SeriousBroker.it  This test is no  t yet approved or cleared by the [place]  FDA and  has been authorized for detection and/or diagnosis of SARS-CoV-2 by FDA under an Emergency Use Authorization (EUA). This EUA will remain  in effect (meaning this test can be used) for the duration of the COVID-19 declaration under Section 564(b)(1) of the Act, 21 U.S.C.section 360bbb-3(b)(1), unless the authorization is terminated  or revoked sooner.       Influenza A by PCR NEGATIVE NEGATIVE Final   Influenza B by PCR NEGATIVE NEGATIVE Final    Comment: (NOTE) The Xpert Xpress SARS-CoV-2/FLU/RSV plus assay is intended as an aid in the diagnosis of influenza from Nasopharyngeal swab specimens and should not be used as a sole basis for treatment. Nasal washings and aspirates are unacceptable for Xpert Xpress SARS-CoV-2/FLU/RSV testing.  Fact Sheet for Patients: BloggerCourse.com  Fact Sheet for Healthcare Providers: SeriousBroker.it  This test is not yet approved or cleared by the [place]  FDA and has been authorized for detection and/or diagnosis of SARS-CoV-2 by FDA under an Emergency Use Authorization (EUA). This EUA will remain in effect (meaning this test can be used) for the duration of the COVID-19 declaration under Section 564(b)(1) of the Act, 21 U.S.C. section 360bbb-3(b)(1), unless the authorization is terminated or revoked.     Resp Syncytial Virus by PCR NEGATIVE NEGATIVE Final    Comment: (NOTE) Fact Sheet for Patients: BloggerCourse.com  Fact Sheet for Healthcare Providers: SeriousBroker.it  This test is not yet approved or cleared by the [place]  FDA and has been authorized for detection and/or diagnosis of SARS-CoV-2 by FDA under an Emergency Use Authorization (EUA). This EUA will remain in effect (meaning this test can be used) for the duration of the COVID-19 declaration under Section  564(b)(1) of the Act, 21 U.S.C. section 360bbb-3(b)(1), unless the authorization is terminated or revoked.  Performed at [facility], [address] [place], [place] [address]      Time coordinating discharge: 35 minutes  SIGNED:   Adron JONETTA Fairly, DO [facility] 10/12/2023, 1:57 PM  If 7PM-7AM, please contact night-coverage [email or web address]

## 2023-10-15 ENCOUNTER — Other Ambulatory Visit: Payer: Self-pay

## 2023-10-15 DIAGNOSIS — Z21 Asymptomatic human immunodeficiency virus [HIV] infection status: Secondary | ICD-10-CM

## 2023-10-16 ENCOUNTER — Telehealth: Payer: Self-pay

## 2023-10-16 LAB — HIV-1/2 AB - DIFFERENTIATION
HIV 1 Ab: NONREACTIVE
HIV 2 Ab: NONREACTIVE
Note: NEGATIVE

## 2023-10-16 LAB — CULTURE, BLOOD (ROUTINE X 2)
Culture: NO GROWTH
Culture: NO GROWTH
Special Requests: ADEQUATE
Special Requests: ADEQUATE

## 2023-10-16 LAB — HIV-1/HIV-2 QUALITATIVE RNA
Final Interpretation: NEGATIVE
HIV-1 RNA, Qualitative: NONREACTIVE
HIV-2 RNA, Qualitative: NONREACTIVE

## 2023-10-16 NOTE — Transitions of Care (Post Inpatient/ED Visit) (Unsigned)
   10/16/2023  Name: [name] MRN: [id number] DOB: 1965-04-20  Today's TOC FU Call Status: Unsuccessful Call (1st Attempt) Date: 10/15/23 Unsuccessful Call (2nd Attempt) Date: 10/16/23  Attempted to reach the patient regarding the most recent Inpatient/ED visit.  Follow Up Plan: Additional outreach attempts will be made to reach the patient to complete the Transitions of Care (Post Inpatient/ED visit) call.   Signature Avelina Essex, CMA (AAMA)  [facility] [phone]

## 2023-10-17 ENCOUNTER — Ambulatory Visit

## 2023-10-17 ENCOUNTER — Encounter: Payer: Self-pay | Admitting: Nurse Practitioner

## 2023-10-17 VITALS — BP 126/80 | HR 70 | Ht 70.0 in | Wt 166.0 lb

## 2023-10-17 DIAGNOSIS — E1069 Type 1 diabetes mellitus with other specified complication: Secondary | ICD-10-CM

## 2023-10-17 DIAGNOSIS — Z09 Encounter for follow-up examination after completed treatment for conditions other than malignant neoplasm: Secondary | ICD-10-CM

## 2023-10-17 DIAGNOSIS — E860 Dehydration: Secondary | ICD-10-CM | POA: Diagnosis not present

## 2023-10-17 DIAGNOSIS — Z21 Asymptomatic human immunodeficiency virus [HIV] infection status: Secondary | ICD-10-CM | POA: Diagnosis not present

## 2023-10-17 DIAGNOSIS — R42 Dizziness and giddiness: Secondary | ICD-10-CM

## 2023-10-17 DIAGNOSIS — Z789 Other specified health status: Secondary | ICD-10-CM

## 2023-10-17 DIAGNOSIS — E785 Hyperlipidemia, unspecified: Secondary | ICD-10-CM

## 2023-10-17 DIAGNOSIS — I152 Hypertension secondary to endocrine disorders: Secondary | ICD-10-CM

## 2023-10-17 DIAGNOSIS — R197 Diarrhea, unspecified: Secondary | ICD-10-CM

## 2023-10-17 LAB — O&P RESULT

## 2023-10-17 LAB — OVA + PARASITE EXAM

## 2023-10-17 MED ORDER — REPATHA 140 MG/ML ~~LOC~~ SOSY
140.0000 mg | PREFILLED_SYRINGE | SUBCUTANEOUS | 5 refills | Status: AC
Start: 2023-10-17 — End: ?

## 2023-10-17 MED ORDER — AMLODIPINE BESYLATE 10 MG PO TABS: 10.0000 mg | ORAL_TABLET | Freq: Every day | ORAL | 1 refills | Status: AC

## 2023-10-17 NOTE — Telephone Encounter (Signed)
 Patient is wanting to push up his appt to the week before his November.  I am ok with overbooking if we have to.

## 2023-10-17 NOTE — Progress Notes (Signed)
 Established Patient Office Visit  Subjective   Patient ID: [name], male    DOB: Apr 17, 1965  Age: 58 y.o. MRN: [id number]  Chief Complaint  Patient presents with   Medical Management of Chronic Issues    3 month follow up    HPI Brief/Interim Summary: seen at [facility] on 10/11/23 for following:  [name] is a 58 y.o. male with hx of DM1, HTN, HLD, recent admission 9/10 - 13 with C diff colitis and dehydration, syncope, who returns with recurrent syncopal episode and ongoing N/V/D.  Reports his symptoms of diarrhea had improved since his recent hospitalization although today returned with having 4 watery bowel movements.  He had repeat C. difficile study which was negative and GI pathogen panel was negative as well.  CT does not demonstrate any acute findings with no colitis noted.  He is now tolerating diet with no nausea or vomiting and having some mild loose stools, but this is much improved.  He did have significant hyperglycemia develop as a result of having to remove his pump during the CT scan, but this is now improving as well and he may resume his pump.  It appears that he may have some pancreatic exocrine insufficiency which may be causing his diarrhea for which she has been prescribed some Creon  to start using to see if this will hopefully be of benefit.  He will need to hold his home olmesartan  for now and follow-up BMP in 1 week with PCP.  No other acute events or concerns noted.  Patient Active Problem List   Diagnosis Date Noted   Diarrhea 10/22/2023   Syncope 10/12/2023   Gastroenteritis 10/12/2023   Dehydration 10/12/2023   Clostridium difficile colitis 10/10/2023   Hospital discharge follow-up 10/10/2023   Sepsis (HCC) 09/26/2023   Acute upper respiratory infection 12/29/2022   Colon cancer screening 10/18/2022   Hyponatremia 10/18/2022   Need for influenza vaccination 10/18/2022   Bilateral hearing loss 10/18/2022   Rash in adult 06/27/2022   Contact  dermatitis 05/03/2022   Elevated alkaline phosphatase level 03/21/2022   Hyperkalemia 03/21/2022   Tobacco use 02/23/2022   Encounter for general adult medical examination with abnormal findings 02/23/2022   HNP (herniated nucleus pulposus), lumbar 10/07/2021   Vitamin D  insufficiency 04/25/2021   Nicotine  dependence with current use 10/28/2020   Gastrointestinal hemorrhage 10/18/2017   Leg edema 08/31/2017   Erectile dysfunction 05/14/2017   Carpal tunnel syndrome 01/04/2017   Hypertension associated with diabetes (HCC) 01/04/2017   Iron deficiency anemia due to chronic blood loss 01/20/2016   Uncontrolled type 1 diabetes mellitus with hyperglycemia, with long-term current use of insulin  (HCC) 01/18/2016   Hyperlipidemia due to T1DM [facility]) - Statin Intolerant 01/18/2016   Gastroesophageal reflux disease without esophagitis 01/18/2016    ROS    Objective:     BP 126/80 (BP Location: Left Arm, Patient Position: Sitting, Cuff Size: Normal)   Pulse 70   Ht 5' 10 (1.778 m)   Wt 166 lb 0.6 oz (75.3 kg)   SpO2 96%   BMI 23.82 kg/m  BP Readings from Last 3 Encounters:  10/17/23 126/80  10/12/23 121/76  10/10/23 134/72   Wt Readings from Last 3 Encounters:  10/17/23 166 lb 0.6 oz (75.3 kg)  10/12/23 168 lb 3.4 oz (76.3 kg)  10/10/23 171 lb (77.6 kg)      Physical Exam Vitals and nursing note reviewed.  Constitutional:      Appearance: Normal appearance.  HENT:     Head: Normocephalic.  Eyes:     Extraocular Movements: Extraocular movements intact.     Pupils: Pupils are equal, round, and reactive to light.  Cardiovascular:     Rate and Rhythm: Normal rate and regular rhythm.  Pulmonary:     Effort: Pulmonary effort is normal.     Breath sounds: Normal breath sounds.  Musculoskeletal:     Cervical back: Normal range of motion and neck supple.  Neurological:     Mental Status: He is alert and oriented to person, place, and time.  Psychiatric:        Mood and  Affect: Mood normal.        Thought Content: Thought content normal.     Last CBC Lab Results  Component Value Date   WBC 7.6 10/17/2023   HGB 12.5 (L) 10/17/2023   HCT 37.6 10/17/2023   MCV 95 10/17/2023   MCH 31.5 10/17/2023   RDW 12.4 10/17/2023   PLT 381 10/17/2023   Last metabolic panel Lab Results  Component Value Date   GLUCOSE 232 (H) 10/17/2023   NA 131 (L) 10/17/2023   K 5.0 10/17/2023   CL 94 (L) 10/17/2023   CO2 24 10/17/2023   BUN 16 10/17/2023   CREATININE 1.19 10/17/2023   GFRNONAA >60 10/12/2023   CALCIUM  9.3 10/17/2023   PHOS 3.1 10/12/2023   PROT 5.9 (L) 10/11/2023   ALBUMIN 2.9 (L) 10/11/2023   LABGLOB 2.9 10/10/2023   AGRATIO 1.4 03/24/2022   BILITOT 0.6 10/11/2023   ALKPHOS 90 10/11/2023   AST 21 10/11/2023   ALT 22 10/11/2023   ANIONGAP 10 10/12/2023   Last lipids Lab Results  Component Value Date   CHOL 167 09/24/2023   HDL 52 09/24/2023   LDLCALC 91 09/24/2023   LDLDIRECT 156.0 01/18/2016   TRIG 135 09/24/2023   CHOLHDL 3.2 09/24/2023   Last hemoglobin A1c Lab Results  Component Value Date   HGBA1C 8.1 (A) 07/24/2023   Last thyroid  functions Lab Results  Component Value Date   TSH 1.804 10/11/2023   Last vitamin D  Lab Results  Component Value Date   VD25OH 39.9 05/19/2022   Last vitamin B12 and Folate Lab Results  Component Value Date   VITAMINB12 568 02/21/2022   FOLATE >20.0 02/21/2022      The 10-year ASCVD risk score (Arnett DK, et al., 2019) is: 19.5%    Assessment & Plan:   Problem List Items Addressed This Visit       Cardiovascular and Mediastinum   Hypertension associated with diabetes (HCC) - Primary (Chronic)   Hypertension managed with amlodipine  5 mg; recent readings suboptimal (165/90 mmHg). Olmesartan  caused adverse effects. - Increase amlodipine  dose to 10 mg daily. - Monitor blood pressure at home, aiming for around 130/80 mmHg.      Relevant Medications   Evolocumab  (REPATHA ) 140 MG/ML  SOSY   amLODipine  (NORVASC ) 10 MG tablet   Other Relevant Orders   Basic Metabolic Panel (BMET) (Completed)     Endocrine   Hyperlipidemia due to T1DM (HCC) - Statin Intolerant   Previously managed with Repatha ; not refilled recently. - Send Repatha  prescription to [facility] for refill.      Relevant Medications   Evolocumab  (REPATHA ) 140 MG/ML SOSY   amLODipine  (NORVASC ) 10 MG tablet     Other   Hospital discharge follow-up   Dehydration   Relevant Orders   Basic Metabolic Panel (BMET) (Completed)   Diarrhea   Chronic diarrhea  with some improvement; possible association with olmesartan , which has been discontinued. - Follow up with GI specialist for further evaluation and management.      Other Visit Diagnoses       HIV antibody positive (HCC)       Recent HIV antibody test reactive; previous test six years ago non-reactive. Further testing required for confirmation. Order additional blood work to confirm.   Relevant Orders   Cd4/cd8 (t-helper/t-suppressor cell)   HIV-1 RNA ultraquant reflex to gentyp+ (Completed)   T-helper cells (CD4) count (Completed)     Statin intolerance       Relevant Medications   Evolocumab  (REPATHA ) 140 MG/ML SOSY     Dizziness       Olmesartan  caused dizziness and hypotension, leading to a fall. Discontinued and added to allergy list. - Avoid prescribing olmesartan  in the future.         No follow-ups on file.    Leita Longs, FNP

## 2023-10-19 LAB — T-HELPER CELLS (CD4) COUNT (NOT AT ARMC)
% CD 4 Pos. Lymph.: 46.6 % (ref 30.8–58.5)
Absolute CD 4 Helper: 559 /uL (ref 359–1519)
Basophils Absolute: 0.1 x10E3/uL (ref 0.0–0.2)
Basos: 1 %
EOS (ABSOLUTE): 0.2 x10E3/uL (ref 0.0–0.4)
Eos: 2 %
Hematocrit: 37.6 % (ref 37.5–51.0)
Hemoglobin: 12.5 g/dL — ABNORMAL LOW (ref 13.0–17.7)
Immature Grans (Abs): 0 x10E3/uL (ref 0.0–0.1)
Immature Granulocytes: 0 %
Lymphocytes Absolute: 1.2 x10E3/uL (ref 0.7–3.1)
Lymphs: 16 %
MCH: 31.5 pg (ref 26.6–33.0)
MCHC: 33.2 g/dL (ref 31.5–35.7)
MCV: 95 fL (ref 79–97)
Monocytes Absolute: 0.5 x10E3/uL (ref 0.1–0.9)
Monocytes: 7 %
Neutrophils Absolute: 5.6 x10E3/uL (ref 1.4–7.0)
Neutrophils: 74 %
Platelets: 381 x10E3/uL (ref 150–450)
RBC: 3.97 x10E6/uL — ABNORMAL LOW (ref 4.14–5.80)
RDW: 12.4 % (ref 11.6–15.4)
WBC: 7.6 x10E3/uL (ref 3.4–10.8)

## 2023-10-19 LAB — BASIC METABOLIC PANEL WITH GFR
BUN/Creatinine Ratio: 13 (ref 9–20)
BUN: 16 mg/dL (ref 6–24)
CO2: 24 mmol/L (ref 20–29)
Calcium: 9.3 mg/dL (ref 8.7–10.2)
Chloride: 94 mmol/L — ABNORMAL LOW (ref 96–106)
Creatinine, Ser: 1.19 mg/dL (ref 0.76–1.27)
Glucose: 232 mg/dL — ABNORMAL HIGH (ref 70–99)
Potassium: 5 mmol/L (ref 3.5–5.2)
Sodium: 131 mmol/L — ABNORMAL LOW (ref 134–144)
eGFR: 71 mL/min/1.73 (ref >59)

## 2023-10-19 LAB — HIV-1 RNA ULTRAQUANT REFLEX TO GENTYP+: HIV1 RNA # SerPl PCR: <20 {copies}/mL

## 2023-10-22 ENCOUNTER — Ambulatory Visit: Payer: Self-pay

## 2023-10-22 DIAGNOSIS — R197 Diarrhea, unspecified: Secondary | ICD-10-CM | POA: Insufficient documentation

## 2023-10-22 NOTE — Assessment & Plan Note (Signed)
 Chronic diarrhea with some improvement; possible association with olmesartan , which has been discontinued. - Follow up with GI specialist for further evaluation and management.

## 2023-10-22 NOTE — Assessment & Plan Note (Signed)
 Previously managed with Repatha ; not refilled recently. - Send Repatha  prescription to [facility] for refill.

## 2023-10-22 NOTE — Assessment & Plan Note (Signed)
 Hypertension managed with amlodipine  5 mg; recent readings suboptimal (165/90 mmHg). Olmesartan  caused adverse effects. - Increase amlodipine  dose to 10 mg daily. - Monitor blood pressure at home, aiming for around 130/80 mmHg.

## 2023-10-29 ENCOUNTER — Encounter: Payer: Self-pay | Admitting: Internal Medicine

## 2023-11-02 ENCOUNTER — Other Ambulatory Visit: Payer: Self-pay | Admitting: Nurse Practitioner

## 2023-11-15 ENCOUNTER — Ambulatory Visit: Admitting: Nurse Practitioner

## 2023-11-21 ENCOUNTER — Other Ambulatory Visit (HOSPITAL_COMMUNITY): Payer: Self-pay

## 2023-11-21 ENCOUNTER — Other Ambulatory Visit: Payer: Self-pay | Admitting: Nurse Practitioner

## 2023-11-21 ENCOUNTER — Telehealth: Payer: Self-pay

## 2023-11-21 NOTE — Telephone Encounter (Signed)
 Pharmacy Patient Advocate Encounter   Received notification from CoverMyMeds that prior authorization for Dexcom G7 sensor is required/requested.   Insurance verification completed.   The patient is insured through [employer].   Per test claim: The current 30 day co-pay is, $0.  No PA needed at this time. This test claim was processed through [facility]- copay amounts may vary at other pharmacies due to pharmacy/plan contracts, or as the patient moves through the different stages of their insurance plan.

## 2023-11-23 ENCOUNTER — Encounter: Payer: Self-pay | Admitting: Nurse Practitioner

## 2023-11-23 ENCOUNTER — Ambulatory Visit (INDEPENDENT_AMBULATORY_CARE_PROVIDER_SITE_OTHER): Admitting: Nurse Practitioner

## 2023-11-23 VITALS — BP 136/74 | HR 68 | Ht 70.0 in | Wt 165.4 lb

## 2023-11-23 DIAGNOSIS — E559 Vitamin D deficiency, unspecified: Secondary | ICD-10-CM

## 2023-11-23 DIAGNOSIS — E109 Type 1 diabetes mellitus without complications: Secondary | ICD-10-CM

## 2023-11-23 DIAGNOSIS — I10 Essential (primary) hypertension: Secondary | ICD-10-CM | POA: Diagnosis not present

## 2023-11-23 DIAGNOSIS — E782 Mixed hyperlipidemia: Secondary | ICD-10-CM | POA: Diagnosis not present

## 2023-11-23 DIAGNOSIS — Z794 Long term (current) use of insulin: Secondary | ICD-10-CM

## 2023-11-23 MED ORDER — OMNIPOD 5 DEXG7G6 PODS GEN 5 MISC: 4 refills | Status: DC

## 2023-11-23 MED ORDER — INSULIN LISPRO 100 UNIT/ML IJ SOLN: INTRAMUSCULAR | 3 refills | Status: AC

## 2023-11-23 MED ORDER — DEXCOM G7 SENSOR MISC: 1.0000 | 3 refills | Status: DC

## 2023-11-23 NOTE — Progress Notes (Signed)
 Endocrinology Follow Up Note       11/23/2023, 9:24 AM   Subjective:    Patient ID: [name], male    DOB: Oct 19, 1965.  [name] is being seen in follow up after being seen in consultation for management of currently uncontrolled symptomatic diabetes requested by  Bevely Doffing, FNP.   Past Medical History:  Diagnosis Date   Allergy    Anemia    Diabetes mellitus without complication (HCC)    type 1 per patient   GERD (gastroesophageal reflux disease)    HLD (hyperlipidemia)    Hypertension     Past Surgical History:  Procedure Laterality Date   ANKLE SURGERY Right    LUMBAR LAMINECTOMY/DECOMPRESSION MICRODISCECTOMY N/A 10/07/2021   Procedure: Microlumbar decompression L4-5, possible L5-S1;  Surgeon: Duwayne Purchase, MD;  Location: [facility] OR;  Service: Orthopedics;  Laterality: N/A;   UPPER GASTROINTESTINAL ENDOSCOPY     VASECTOMY  1994    Social History   Socioeconomic History   Marital status: Married    Spouse name: Not on file   Number of children: 1   Years of education: Not on file   Highest education level: Associate degree: occupational, scientist, product/process development, or vocational program  Occupational History   Occupation: holiday representative  Tobacco Use   Smoking status: Every Day    Current packs/day: 1.00    Average packs/day: 1 pack/day for 30.0 years (30.0 ttl pk-yrs)    Types: Cigarettes   Smokeless tobacco: Never  Vaping Use   Vaping status: Never Used  Substance and Sexual Activity   Alcohol use: Not Currently    Alcohol/week: 0.0 - 8.0 standard drinks of alcohol   Drug use: No   Sexual activity: Yes  Other Topics Concern   Not on file  Social History Narrative   Not on file   Social Drivers of Health   Financial Resource Strain: High Risk (09/11/2022)   Overall Financial Resource Strain (CARDIA)    Difficulty of Paying Living Expenses: Hard  Food Insecurity: No Food Insecurity  (10/12/2023)   Hunger Vital Sign    Worried About Running Out of Food in the Last Year: Never true    Ran Out of Food in the Last Year: Never true  Transportation Needs: No Transportation Needs (10/12/2023)   PRAPARE - Administrator, Civil Service (Medical): No    Lack of Transportation (Non-Medical): No  Physical Activity: Not on file  Stress: Not on file  Social Connections: Socially Integrated (10/12/2023)   Social Connection and Isolation Panel    Frequency of Communication with Friends and Family: Twice a week    Frequency of Social Gatherings with Friends and Family: Twice a week    Attends Religious Services: 1 to 4 times per year    Active Member of [employer] or Organizations: No    Attends Engineer, Structural: 1 to 4 times per year    Marital Status: Married    Family History  Problem Relation Age of Onset   COPD Mother    Diabetes Mother    Early death Father 56       MI   Heart attack Father  Lymphoma Sister 74   Hypertension Brother    Diabetes Maternal Grandmother    Stroke Neg Hx    Alcohol abuse Neg Hx    Drug abuse Neg Hx    Hyperlipidemia Neg Hx    Kidney disease Neg Hx    Colon cancer Neg Hx    Esophageal cancer Neg Hx    Stomach cancer Neg Hx    Rectal cancer Neg Hx     Outpatient Encounter Medications as of 11/23/2023  Medication Sig   amLODipine  (NORVASC ) 10 MG tablet Take 1 tablet (10 mg total) by mouth daily. TAKE 1 TABLET(5 MG) BY MOUTH DAILY   esomeprazole  (NEXIUM ) 40 MG capsule Take 1 capsule (40 mg total) by mouth 2 (two) times daily before a meal.   Evolocumab  (REPATHA ) 140 MG/ML SOSY Inject 140 mg into the skin every 14 (fourteen) days.   Insulin  Disposable Pump (OMNIPOD 5 G6 INTRO, GEN 5,) KIT Change pod every 48-72 hrs   triamcinolone  cream (KENALOG ) 0.1 % Apply topically 2 (two) times daily.   [DISCONTINUED] Continuous Glucose Sensor (DEXCOM G7 SENSOR) MISC Use to monitor blood sugar continuously. Change sensor every 10  days as directed.   [DISCONTINUED] Continuous Glucose Sensor (DEXCOM G7 SENSOR) MISC Use as directed and change every 10 days.   [DISCONTINUED] HUMALOG  100 UNIT/ML injection Use with Omnipod for TDD around 50 units per day   [DISCONTINUED] Insulin  Disposable Pump (OMNIPOD 5 DEXG7G6 PODS GEN 5) MISC CHANGE POD EVERY 48 TO 72 HOURS   [DISCONTINUED] Insulin  Disposable Pump (OMNIPOD 5 DEXG7G6 PODS GEN 5) MISC Change pod every 48 to 72 hours   [DISCONTINUED] lipase/protease/amylase (CREON ) 12000-38000 units CPEP capsule Take 1 capsule (12,000 Units total) by mouth 3 (three) times daily before meals.   Continuous Glucose Sensor (DEXCOM G7 SENSOR) MISC Use to monitor blood sugar continuously. Change sensor every 10 days as directed.   Insulin  Disposable Pump (OMNIPOD 5 DEXG7G6 PODS GEN 5) MISC Change pod every 48 to 72 hours   insulin  lispro (HUMALOG ) 100 UNIT/ML injection Use with Omnipod for TDD around 60 units daily.   [DISCONTINUED] insulin  lispro (HUMALOG ) 100 UNIT/ML injection Use with Omnipod for total daily dose of 50 units per day.   No facility-administered encounter medications on file as of 11/23/2023.    ALLERGIES: Allergies  Allergen Reactions   Crestor  [Rosuvastatin  Calcium ] Other (See Comments)    Muscle aches   Iron Other (See Comments)    Tore up my stomach  Liquid Iron   Lipitor [Atorvastatin  Calcium ] Other (See Comments)    Muscles aches   Statins Other (See Comments)    Muscle aches   Olmesartan  Other (See Comments)    dizziness    VACCINATION STATUS: Immunization History  Administered Date(s) Administered   Influenza, Seasonal, Injecte, Preservative Fre 10/18/2022   Influenza,inj,Quad PF,6+ Mos 01/18/2016, 10/17/2017, 11/06/2019, 10/28/2020   Influenza,inj,quad, With Preservative 10/30/2018   Influenza-Unspecified 10/17/2017, 10/10/2021   PFIZER(Purple Top)SARS-COV-2 Vaccination 04/10/2019, 05/05/2019   Pneumococcal Polysaccharide-23 10/21/2014   Tdap  06/02/2013    Diabetes He presents for his follow-up diabetic visit. He has type 1 diabetes mellitus. Onset time: Diagnosed with LADA at age of 2. His disease course has been fluctuating. There are no hypoglycemic associated symptoms. Associated symptoms include blurred vision and fatigue. There are no hypoglycemic complications. Symptoms are stable. There are no diabetic complications. Risk factors for coronary artery disease include diabetes mellitus, dyslipidemia, family history, male sex and tobacco exposure. Current diabetic treatment includes insulin  pump. He  is compliant with treatment most of the time. His weight is fluctuating minimally. He is following a generally healthy diet. When asked about meal planning, he reported none. He has not had a previous visit with a dietitian. He participates in exercise daily. His home blood glucose trend is fluctuating minimally. His overall blood glucose range is 140-180 mg/dl. (He presents today with his CGM Omnipod 5 combo showing at target fasting and above target postprandial readings.  His POCT A1c today is 8.6%, increasing from last visit of 8.1%.  He has been in/out of the hospital for GI issues thought to be possible EPI but ended up being Cdiff colitis.  He was on multiple antibiotics for this.  Analysis of his CGM shows TIR 61%, TAR 39%, TBR 0%.  It appears he has not bolused consistently prior to all meals.) An ACE inhibitor/angiotensin II receptor blocker is being taken. He does not see a podiatrist.Eye exam is not current.    Review of systems  Constitutional: + Minimally fluctuating body weight,  current Body mass index is 23.73 kg/m. , no fatigue, no subjective hyperthermia, no subjective hypothermia Eyes: no blurry vision, no xerophthalmia ENT: no sore throat, no nodules palpated in throat, no dysphagia/odynophagia, no hoarseness Cardiovascular: no chest pain, no shortness of breath, no palpitations, no leg swelling Respiratory: no cough,  no shortness of breath Gastrointestinal: no nausea/vomiting/diarrhea Musculoskeletal: no muscle/joint aches Skin: no rashes, no hyperemia Neurological: no tremors, no numbness, no tingling, no dizziness Psychiatric: no depression, no anxiety  Objective:     BP 136/74 (BP Location: Left Arm, Patient Position: Sitting, Cuff Size: Large)   Pulse 68   Ht 5' 10 (1.778 m)   Wt 165 lb 6.4 oz (75 kg)   BMI 23.73 kg/m   Wt Readings from Last 3 Encounters:  11/23/23 165 lb 6.4 oz (75 kg)  10/17/23 166 lb 0.6 oz (75.3 kg)  10/12/23 168 lb 3.4 oz (76.3 kg)     BP Readings from Last 3 Encounters:  11/23/23 136/74  10/17/23 126/80  10/12/23 121/76      Physical Exam- Limited  Constitutional:  Body mass index is 23.73 kg/m. , not in acute distress, normal state of mind Eyes:  EOMI, no exophthalmos Musculoskeletal: no gross deformities, strength intact in all four extremities, no gross restriction of joint movements Skin:  no rashes, no hyperemia Neurological: no tremor with outstretched hands   Diabetic Foot Exam - Simple   No data filed     CMP ( most recent) CMP     Component Value Date/Time   NA 131 (L) 10/17/2023 1010   K 5.0 10/17/2023 1010   CL 94 (L) 10/17/2023 1010   CO2 24 10/17/2023 1010   GLUCOSE 232 (H) 10/17/2023 1010   GLUCOSE 231 (H) 10/12/2023 1135   BUN 16 10/17/2023 1010   CREATININE 1.19 10/17/2023 1010   CALCIUM  9.3 10/17/2023 1010   PROT 5.9 (L) 10/11/2023 1542   PROT 6.6 10/10/2023 1420   ALBUMIN 2.9 (L) 10/11/2023 1542   ALBUMIN 3.7 (L) 10/10/2023 1420   AST 21 10/11/2023 1542   ALT 22 10/11/2023 1542   ALKPHOS 90 10/11/2023 1542   BILITOT 0.6 10/11/2023 1542   BILITOT 0.2 10/10/2023 1420   GFRNONAA >60 10/12/2023 1135   GFRAA >60 10/10/2017 0542     Diabetic Labs (most recent): Lab Results  Component Value Date   HGBA1C 8.1 (A) 07/24/2023   HGBA1C 8.1 (A) 03/21/2023   HGBA1C 7.7 (A) 11/21/2022  MICROALBUR 150 mg/L 07/24/2023    MICROALBUR 150 mg/mL 07/27/2022   MICROALBUR 150 06/20/2021     Lipid Panel ( most recent) Lipid Panel     Component Value Date/Time   CHOL 167 09/24/2023 0817   TRIG 135 09/24/2023 0817   HDL 52 09/24/2023 0817   CHOLHDL 3.2 09/24/2023 0817   CHOLHDL 3 10/28/2020 0943   VLDL 15.4 10/28/2020 0943   LDLCALC 91 09/24/2023 0817   LDLDIRECT 156.0 01/18/2016 1020   LABVLDL 24 09/24/2023 0817      Lab Results  Component Value Date   TSH 1.804 10/11/2023   TSH 2.010 02/21/2022   TSH 1.78 02/02/2021   TSH 1.65 10/28/2020   TSH 2.15 01/18/2016   FREET4 1.03 02/21/2022           Assessment & Plan:   1) Type 1 diabetes mellitus without complication (HCC)  He presents today with his CGM Omnipod 5 combo showing at target fasting and above target postprandial readings.  His POCT A1c today is 8.6%, increasing from last visit of 8.1%.  He has been in/out of the hospital for GI issues thought to be possible EPI but ended up being Cdiff colitis.  He was on multiple antibiotics for this.  Analysis of his CGM shows TIR 61%, TAR 39%, TBR 0%.  It appears he has not bolused consistently prior to all meals.  - [name] has currently uncontrolled symptomatic LADA DM since 58 years of age.   -Recent labs reviewed.   - I had a long discussion with him about the progressive nature of diabetes and the pathology behind its complications. -his diabetes is complicated by drastic fluctuations and severe hypoglycemia and he remains at a high risk for more acute and chronic complications which include CAD, CVA, CKD, retinopathy, and neuropathy. These are all discussed in detail with him.  The following Lifestyle Medicine recommendations according to American College of Lifestyle Medicine [facility]) were discussed and offered to patient and he agrees to start the journey:  A. Whole Foods, Plant-based plate comprising of fruits and vegetables, plant-based proteins, whole-grain carbohydrates was discussed  in detail with the patient.   A list for source of those nutrients were also provided to the patient.  Patient will use only water or unsweetened tea for hydration. B.  The need to stay away from risky substances including alcohol, smoking; obtaining 7 to 9 hours of restorative sleep, at least 150 minutes of moderate intensity exercise weekly, the importance of healthy social connections,  and stress reduction techniques were discussed. C.  A full color page of  Calorie density of various food groups per pound showing examples of each food groups was provided to the patient.  - Nutritional counseling repeated/built upon at each appointment.  - The patient admits there is a room for improvement in their diet and drink choices. -  Suggestion is made for the patient to avoid simple carbohydrates from their diet including Cakes, Sweet Desserts / Pastries, Ice Cream, Soda (diet and regular), Sweet Tea, Candies, Chips, Cookies, Sweet Pastries, Store Bought Juices, Alcohol in Excess of 1-2 drinks a day, Artificial Sweeteners, Coffee Creamer, and Sugar-free Products. This will help patient to have stable blood glucose profile and potentially avoid unintended weight gain.   - I encouraged the patient to switch to unprocessed or minimally processed complex starch and increased protein intake (animal or plant source), fruits, and vegetables.   - Patient is advised to stick to a routine mealtimes to  eat 3 meals a day and avoid unnecessary snacks (to snack only to correct hypoglycemia).  - I have approached him with the following individualized plan to manage his diabetes and patient agrees:   -I did adjust his max basal rate to 6 units per hour just in case he forgets to bolus prior to a meal that the algorithm can help bring his glucose down a bit quicker.   -he is encouraged to continue monitoring glucose 4 times daily (using his CGM), before meals and before bed, and to call the clinic if he has readings  less than 70 or above 300 for 3 tests in a row.    - he is warned not to take insulin  without proper monitoring per orders. - Adjustment parameters are given to him for hypo and hyperglycemia in writing.  - his Metformin  was discontinued, risk outweighs benefit for this patient, given his type 1 diagnosis where insulin  treatment is necessary.  - Specific targets for  A1c; LDL, HDL, and Triglycerides were discussed with the patient.  2) Blood Pressure /Hypertension:  his blood pressure is above target but he notes he just took his medications on the drive here and meds were just recently adjusted by his PCP.   he is advised to continue his current medications as prescribed by PCP.  We did talk about potential of sending him to a nephrologist for their assistance in management, especially since he still has significant proteinuria.  3) Lipids/Hyperlipidemia:    Review of his recent lipid panel from 07/18/23 showed uncontrolled LDL at 126. He is allergic to statin medications, is on Repatha .  He is advised to avoid fried foods and butter.  4)  Weight/Diet:  his Body mass index is 23.73 kg/m.  -    he is NOT a candidate for weight loss.   Exercise, and detailed carbohydrates information provided  -  detailed on discharge instructions.  5) Chronic Care/Health Maintenance: -he is on ACEI/ARB and not on Statin medications and is encouraged to initiate and continue to follow up with Ophthalmology, Dentist, Podiatrist at least yearly or according to recommendations, and advised to QUIT SMOKING. I have recommended yearly flu vaccine and pneumonia vaccine at least every 5 years; moderate intensity exercise for up to 150 minutes weekly; and sleep for at least 7 hours a day.  - he is advised to maintain close follow up with Bevely Doffing, FNP for primary care needs, as well as his other providers for optimal and coordinated care.      I spent  22  minutes in the care of the patient today including  review of labs from CMP, Lipids, Thyroid  Function, Hematology (current and previous including abstractions from other facilities); face-to-face time discussing  his blood glucose readings/logs, discussing hypoglycemia and hyperglycemia episodes and symptoms, medications doses, his options of short and long term treatment based on the latest standards of care / guidelines;  discussion about incorporating lifestyle medicine;  and documenting the encounter. Risk reduction counseling performed per USPSTF guidelines to reduce obesity and cardiovascular risk factors.     Please refer to Patient Instructions for Blood Glucose Monitoring and Insulin /Medications Dosing Guide  in media tab for additional information. Please  also refer to  Patient Self Inventory in the Media  tab for reviewed elements of pertinent patient history.  [name] participated in the discussions, expressed understanding, and voiced agreement with the above plans.  All questions were answered to his satisfaction. he is encouraged to contact  clinic should he have any questions or concerns prior to his return visit.    Follow up plan: - Return in about 4 months (around 03/22/2024) for Diabetes F/U with A1c in office, No previsit labs, Bring meter and logs.   Benton Rio, [facility] [address] [place], [place] [address] Phone: [phone] Fax: [phone]  11/23/2023, 9:24 AM

## 2023-11-26 ENCOUNTER — Ambulatory Visit: Admitting: Nurse Practitioner

## 2023-11-28 NOTE — Telephone Encounter (Signed)
 Patient was called ans a message was left, sharing the information that the PA team had shared with our office.

## 2023-12-04 ENCOUNTER — Other Ambulatory Visit: Payer: Self-pay

## 2023-12-06 ENCOUNTER — Ambulatory Visit (INDEPENDENT_AMBULATORY_CARE_PROVIDER_SITE_OTHER): Admitting: Internal Medicine

## 2023-12-06 ENCOUNTER — Telehealth: Payer: Self-pay | Admitting: *Deleted

## 2023-12-06 ENCOUNTER — Telehealth: Payer: Self-pay | Admitting: Nurse Practitioner

## 2023-12-06 ENCOUNTER — Encounter: Payer: Self-pay | Admitting: Internal Medicine

## 2023-12-06 VITALS — BP 140/72 | HR 81 | Ht 70.0 in | Wt 170.1 lb

## 2023-12-06 DIAGNOSIS — Z8711 Personal history of peptic ulcer disease: Secondary | ICD-10-CM

## 2023-12-06 DIAGNOSIS — Z860100 Personal history of colon polyps, unspecified: Secondary | ICD-10-CM

## 2023-12-06 DIAGNOSIS — K5520 Angiodysplasia of colon without hemorrhage: Secondary | ICD-10-CM

## 2023-12-06 DIAGNOSIS — K219 Gastro-esophageal reflux disease without esophagitis: Secondary | ICD-10-CM

## 2023-12-06 DIAGNOSIS — Z862 Personal history of diseases of the blood and blood-forming organs and certain disorders involving the immune mechanism: Secondary | ICD-10-CM

## 2023-12-06 DIAGNOSIS — A0472 Enterocolitis due to Clostridium difficile, not specified as recurrent: Secondary | ICD-10-CM | POA: Diagnosis not present

## 2023-12-06 NOTE — Progress Notes (Signed)
 HISTORY OF PRESENT ILLNESS:  [name] is a 58 y.o. male, self-employed in the concrete business, with longstanding insulin -dependent diabetes mellitus, hypertension, hyperlipidemia, GERD, and adenomatous colon polyps.  He is sent today by his primary care physician regarding recent hospitalizations for C. difficile colitis.  The patient was seen in this office in October 2019 for recurrent iron deficiency anemia.  This was felt to be on the basis of right sided AVMs as noted on colonoscopy March 2018.  In March 2018 he also underwent an upper endoscopy which revealed an NSAID related ulcer.  See that dictation for details.  Patient was in his usual state of health until August 2025 when he developed problems with diarrhea.  This after having received antibiotics.  He was evaluated in urgent care.  He subsequently went to [place] for a trip and developed severe diarrhea with dehydration and eventually a syncopal episode which led to being hospitalized.  He tells me that he was evaluated and resuscitated and discharged.  He was subsequently hospitalized in [facility] September 2025.  Diagnosed with C. difficile colitis.  Treated.  Subsequent relapse of diarrhea.  Was admitted to [facility].  Treated.  Previous colitis on CT and stool studies have resolved.  He was seen by his PCP October 17, 2023.  Reviewed.  Referred to GI.  Currently he feels well and back to his baseline health.  No new complaints or issues.  1.  COLONOSCOPY April 11, 2016 to evaluate iron deficiency anemia.  Multiple small cecal AVMs.  5 mm adenoma removed.  Multiple left-sided diverticula.  Internal hemorrhoids.  Follow-up colonoscopy 5 to 10 years 2.  EGD April 11, 2016 to evaluate iron deficiency anemia and reflux.  Small clean-based prepyloric ulcer.  Otherwise normal.  Testing for Helicobacter pylori is negative 3.  Laboratories: October 17, 2023.  Hemoglobin 12.5, white blood cell count 7.6. 4.  Contrast-enhanced CT  scan of the abdomen and pelvis October 11, 2023.  Mild bladder wall thickening.  Otherwise unremarkable.  No colitis.  REVIEW OF SYSTEMS:  All non-GI ROS negative except for right foot drop  Past Medical History:  Diagnosis Date   Allergy    Anemia    Colitis 2025   Diabetes mellitus without complication (HCC)    type 1 per patient   GERD (gastroesophageal reflux disease)    HLD (hyperlipidemia)    Hypertension     Past Surgical History:  Procedure Laterality Date   ANKLE SURGERY Right    LUMBAR LAMINECTOMY/DECOMPRESSION MICRODISCECTOMY N/A 10/07/2021   Procedure: Microlumbar decompression L4-5, possible L5-S1;  Surgeon: Duwayne Purchase, MD;  Location: [facility] OR;  Service: Orthopedics;  Laterality: N/A;   UPPER GASTROINTESTINAL ENDOSCOPY     VASECTOMY  1994    Social History SORA OLIVO  reports that he has been smoking cigarettes. He has a 30 pack-year smoking history. He has never used smokeless tobacco. He reports that he does not currently use alcohol. He reports that he does not use drugs.  family history includes COPD in his mother; Diabetes in his maternal grandmother and mother; Early death (age of onset: 80) in his father; Heart attack in his father; Hypertension in his brother; Lymphoma (age of onset: 25) in his sister.  Allergies  Allergen Reactions   Crestor  [Rosuvastatin  Calcium ] Other (See Comments)    Muscle aches   Iron Other (See Comments)    Tore up my stomach  Liquid Iron   Lipitor [Atorvastatin  Calcium ] Other (See Comments)  Muscles aches   Statins Other (See Comments)    Muscle aches   Olmesartan  Other (See Comments)    dizziness       PHYSICAL EXAMINATION: Vital signs: BP (!) 140/72   Pulse 81   Ht 5' 10 (1.778 m)   Wt 170 lb 2 oz (77.2 kg)   BMI 24.41 kg/m   Constitutional: generally well-appearing, no acute distress Psychiatric: alert and oriented x3, cooperative Eyes: extraocular movements intact, anicteric, conjunctiva  pink Mouth: oral pharynx moist, no lesions Neck: supple no lymphadenopathy Cardiovascular: heart regular rate and rhythm, no murmur Lungs: clear to auscultation bilaterally Abdomen: soft, nontender, nondistended, no obvious ascites, no peritoneal signs, normal bowel sounds, no organomegaly Rectal: Omitted Extremities: no clubbing, cyanosis, or lower extremity edema bilaterally Skin: no lesions on visible extremities Neuro: No focal deficits save right foot drop.  Cranial nerves intact  ASSESSMENT:  1.  Recent problems with C. difficile colitis.  Quite ill.  Appropriately treated.  Now better. 2.  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  Anemia resolved. 3.  Diminutive adenoma on colonoscopy 2018 4.  GERD.  On Nexium . 5.  Small ulcer on EGD 2018.  H. pylori negative 6.  Multiple medical problems including insulin  requiring diabetes.  Stable.    PLAN:  1.  Judicious use of antibiotics 2.  Multivitamin with iron supplement daily 3.  Surveillance colonoscopy.  Discussed current guidelines.  We have placed his recall for March 2028 4.  Reflux precautions 5.  Continue Nexium  6.  Minimize NSAIDs 7.  Return to the care of PCP 8.  GI follow-up as needed Total time of 45 minutes was spent preparing to see the patient, obtaining comprehensive history, performing medically appropriate physical exam, counseling and educating the patient regarding the above listed issues, defining follow-up intervals, and documenting clinical information in the health record

## 2023-12-06 NOTE — Patient Instructions (Signed)
 Please follow up as needed.  _______________________________________________________  If your blood pressure at your visit was 140/90 or greater, please contact your primary care physician to follow up on this.  _______________________________________________________  If you are age 57 or older, your body mass index should be between 23-30. Your Body mass index is 24.41 kg/m. If this is out of the aforementioned range listed, please consider follow up with your Primary Care Provider.  If you are age 57 or younger, your body mass index should be between 19-25. Your Body mass index is 24.41 kg/m. If this is out of the aformentioned range listed, please consider follow up with your Primary Care Provider.   ________________________________________________________  The [facility] providers would like to encourage you to use MYCHART to communicate with providers for non-urgent requests or questions.  Due to long hold times on the telephone, sending your provider a message by [facility] may be a faster and more efficient way to get a response.  Please allow 48 business hours for a response.  Please remember that this is for non-urgent requests.  _______________________________________________________  [facility] is using a team-based approach to care.  Your team is made up of your doctor and two to three APPS. Our APPS (Nurse Practitioners and Physician Assistants) work with your physician to ensure care continuity for you. They are fully qualified to address your health concerns and develop a treatment plan. They communicate directly with your gastroenterologist to care for you. Seeing the Advanced Practice Practitioners on your physician's team can help you by facilitating care more promptly, often allowing for earlier appointments, access to diagnostic testing, procedures, and other specialty referrals.

## 2023-12-06 NOTE — Telephone Encounter (Signed)
 Patient's wife stopped by stating that he needs a letter stating his medical condition. She said she has to turn it in by the 24th. She did say he left you a VM this am and I told her you would get to it as soon as you could

## 2023-12-07 NOTE — Telephone Encounter (Signed)
 Patient presented t the office today, he was given the letter that he needed and a copy of his recent office note for his insurance.

## 2023-12-07 NOTE — Telephone Encounter (Signed)
 Patient presented to the office today. He was given a copy of the letter plus his recent office noted for his insurance.

## 2023-12-17 ENCOUNTER — Encounter: Payer: Self-pay | Admitting: Nurse Practitioner

## 2023-12-17 MED ORDER — OMNIPOD 5 DEXG7G6 PODS GEN 5 MISC: 6 refills | Status: AC

## 2023-12-17 MED ORDER — DEXCOM G7 SENSOR MISC: 1.0000 | 3 refills | Status: AC

## 2024-01-14 ENCOUNTER — Telehealth: Payer: Self-pay

## 2024-01-14 NOTE — Telephone Encounter (Signed)
 Note written and pt advised

## 2024-01-14 NOTE — Telephone Encounter (Signed)
 Copied from CRM #[id number]. Topic: General - Other >> Jan 14, 2024 10:40 AM Shanda MATSU wrote: Reason for CRM: Caller called in stating that patient has applied for [place] [employer] program, program is req documentation stating that patient relies on diabetic pump for his insulin , caller is wanting to know if some kind of letter can be written stating this, caller req that if able to letter be uploaded to MyChart, is req a call back on this req.

## 2024-01-15 ENCOUNTER — Telehealth: Payer: Self-pay

## 2024-01-15 NOTE — Telephone Encounter (Signed)
 Copied from CRM [id number]. Topic: General - Other >> Jan 15, 2024 10:07 AM Hadassah PARAS wrote: Reason for CRM: Pt's wife is calling to req for letter that contains information about diabetes and inslulin pump to be uploaded on myChart as it is showing it was printed only. Please advise on #[phone]

## 2024-01-15 NOTE — Telephone Encounter (Signed)
Sent threw [employer]

## 2024-03-25 ENCOUNTER — Ambulatory Visit: Admitting: Nurse Practitioner

## 2024-04-16 IMAGING — CT CT HEAD W/O CM
4 series · 16 of 47 positions shown, 18 images · non-contrast
Comparison: None Available.

CLINICAL DATA: Delirium



[Series 3: head without · axial · non-contrast · 0.44mm/px · z∈[-57,+58]mm · 7 of 31 slices shown, 9 images]
[im 4/31  brain]
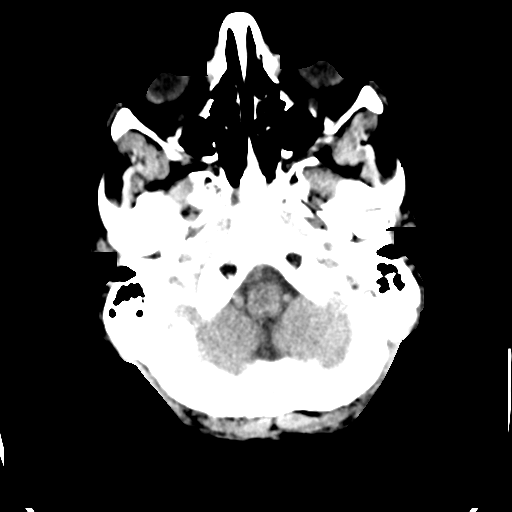
[im 4/31  bone]
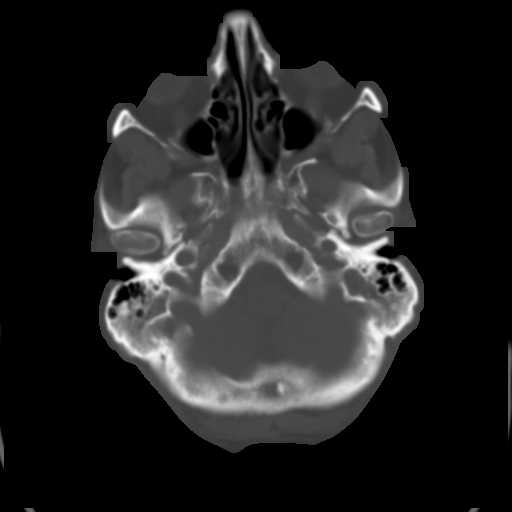
[im 8/31  brain]
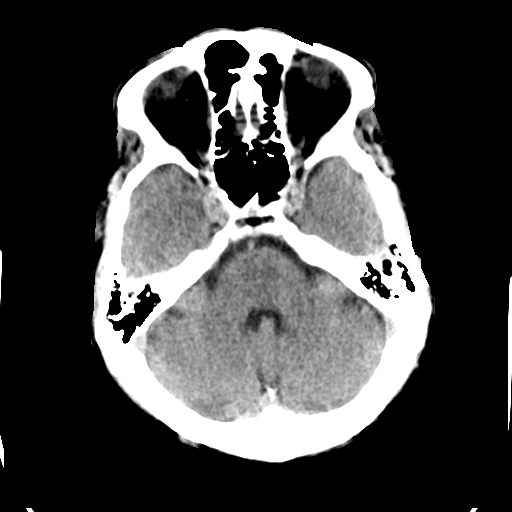
[im 12/31  brain]
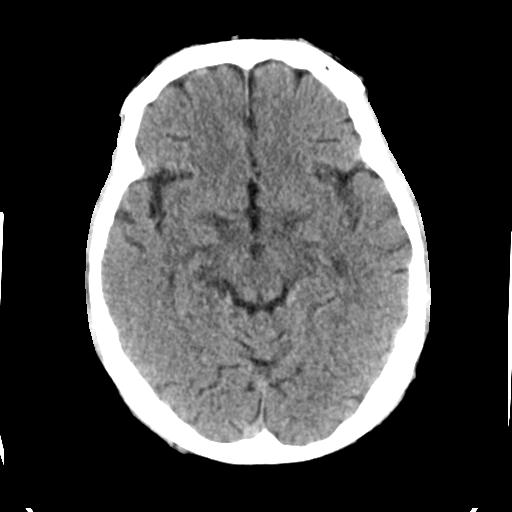
[im 16/31  brain]
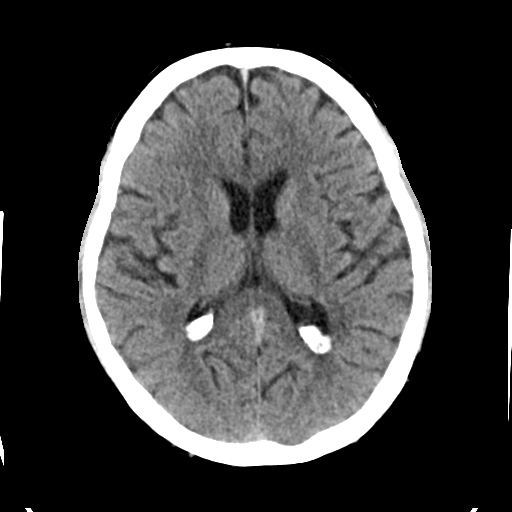
[im 19/31  brain]
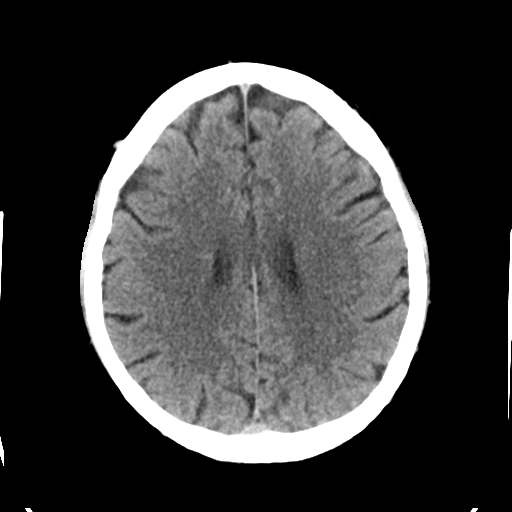
[im 19/31  bone]
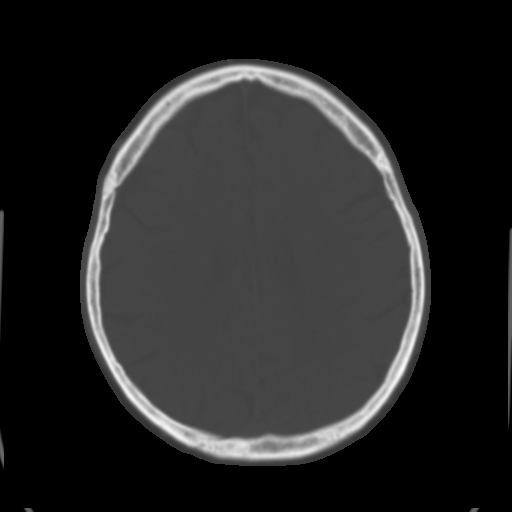
[im 23/31  brain]
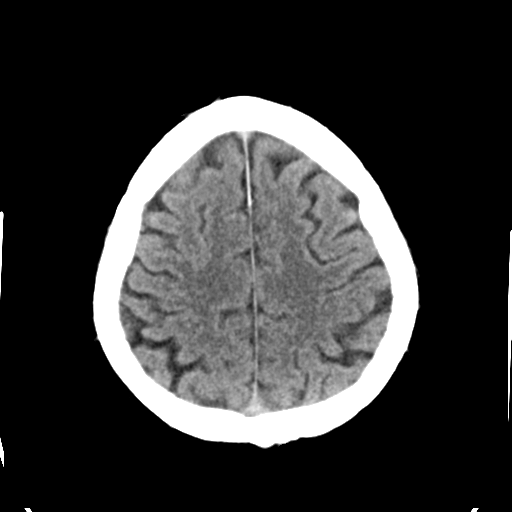
[im 27/31  brain]
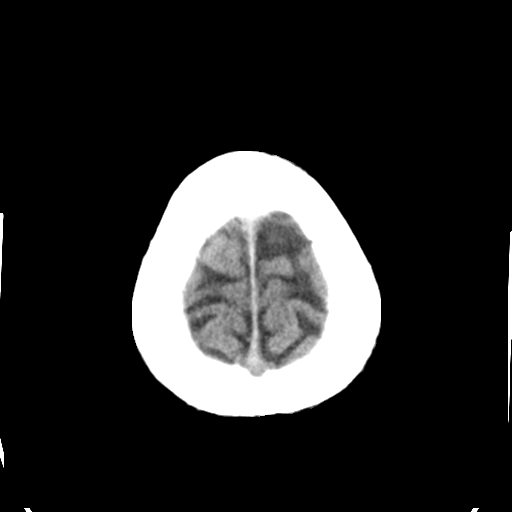

[Series 4: head bone · axial · 0.44mm/px · z∈[-58,-26]mm · 3 of 78 slices shown]
[im 8/78  bone]
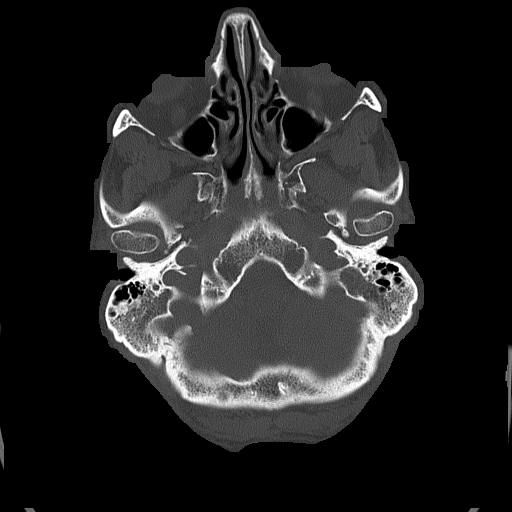
[im 16/78  bone]
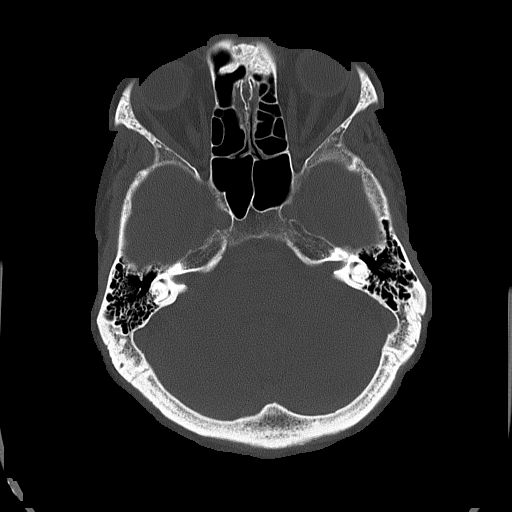
[im 24/78  bone]
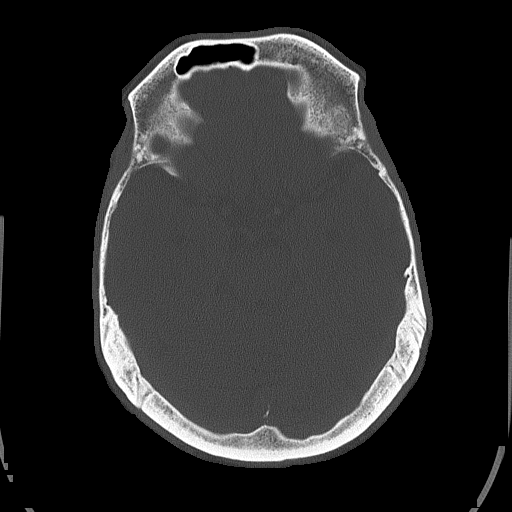

[Series 5: head without cor · coronal · non-contrast · 0.30mm/px · 3 of 69 slices shown]
[im 23/69  brain]
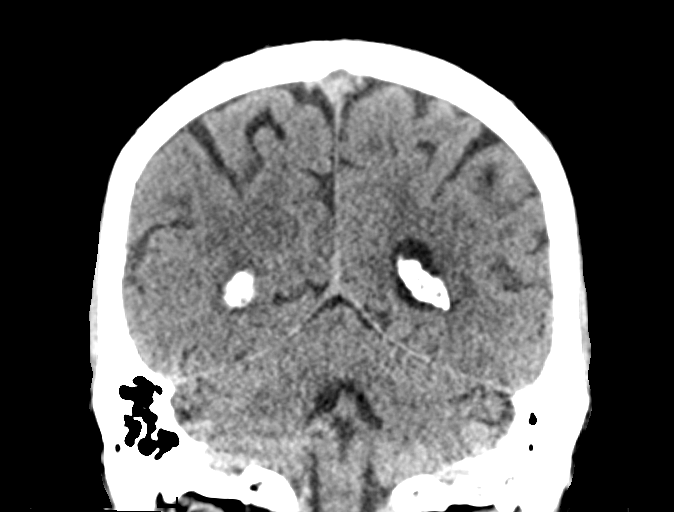
[im 31/69  brain]
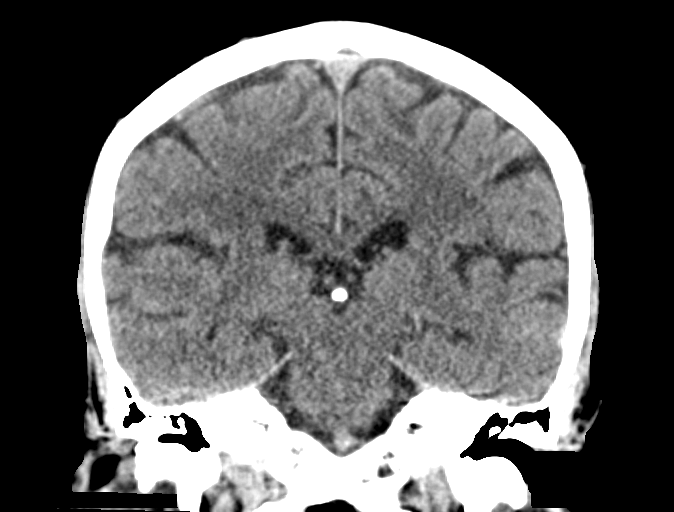
[im 38/69  brain]
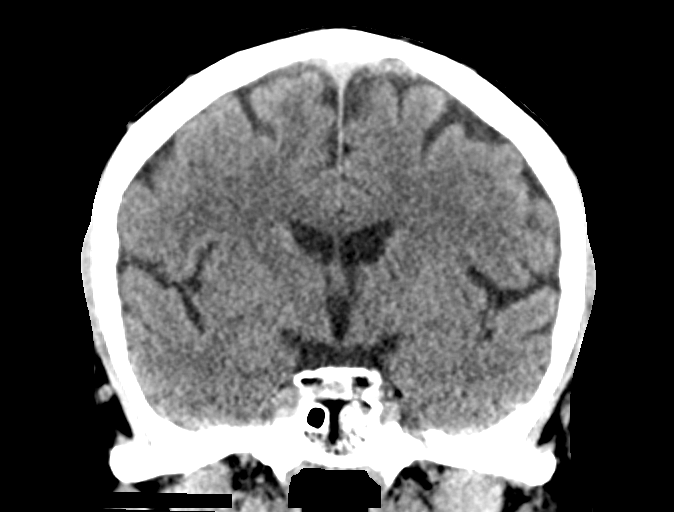

[Series 6: head without sag · sagittal · non-contrast · 0.30mm/px · 3 of 58 slices shown]
[im 20/58  brain]
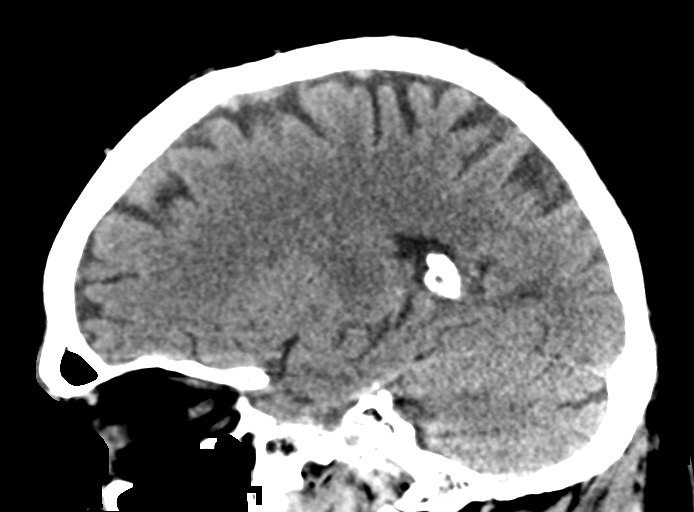
[im 29/58  brain]
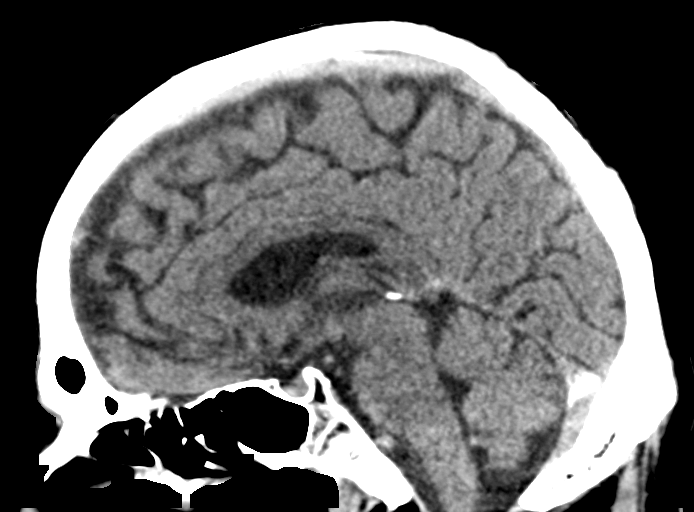
[im 39/58  brain]
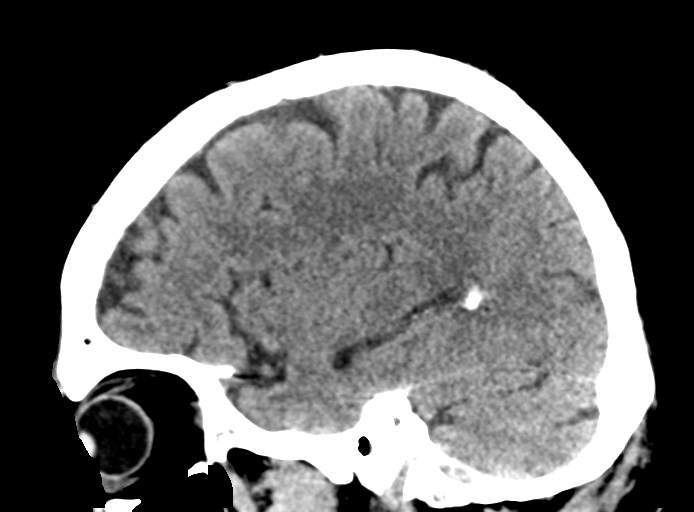

[16 of 47 positions shown; findings below may reference images not displayed]

FINDINGS: Brain: No evidence of acute infarction, hemorrhage, hydrocephalus,
extra-axial collection or mass lesion/mass effect.

Vascular: No hyperdense vessel or unexpected calcification.

Skull: No osseous abnormality.

Sinuses/Orbits: Visualized paranasal sinuses are clear. Visualized
mastoid sinuses are clear. Visualized orbits demonstrate no focal
abnormality.

Other: None
IMPRESSION: 1. No acute intracranial pathology.
